# Patient Record
Sex: Female | Born: 1985
Health system: Southern US, Community
[De-identification: ages and names within clinical notes are randomized; demographics above are authoritative.]

## PROBLEM LIST (undated history)

## (undated) ENCOUNTER — Inpatient Hospital Stay (HOSPITAL_COMMUNITY): Payer: Self-pay

## (undated) DIAGNOSIS — I10 Essential (primary) hypertension: Secondary | ICD-10-CM

## (undated) DIAGNOSIS — T7840XA Allergy, unspecified, initial encounter: Secondary | ICD-10-CM

## (undated) DIAGNOSIS — IMO0002 Reserved for concepts with insufficient information to code with codable children: Secondary | ICD-10-CM

## (undated) DIAGNOSIS — F419 Anxiety disorder, unspecified: Secondary | ICD-10-CM

## (undated) DIAGNOSIS — F32A Depression, unspecified: Secondary | ICD-10-CM

## (undated) DIAGNOSIS — R87619 Unspecified abnormal cytological findings in specimens from cervix uteri: Secondary | ICD-10-CM

## (undated) HISTORY — PX: LEEP: SHX91

## (undated) HISTORY — DX: Anxiety disorder, unspecified: F41.9

## (undated) HISTORY — DX: Allergy, unspecified, initial encounter: T78.40XA

## (undated) HISTORY — DX: Depression, unspecified: F32.A

## (undated) HISTORY — PX: ABDOMINAL HYSTERECTOMY: SHX81

## (undated) HISTORY — PX: WISDOM TOOTH EXTRACTION: SHX21

## (undated) HISTORY — DX: Unspecified abnormal cytological findings in specimens from cervix uteri: R87.619

---

## 2010-10-28 ENCOUNTER — Emergency Department (HOSPITAL_COMMUNITY)
Admission: EM | Admit: 2010-10-28 | Discharge: 2010-10-28 | Payer: Self-pay | Source: Home / Self Care | Admitting: Family Medicine

## 2011-05-20 ENCOUNTER — Inpatient Hospital Stay (INDEPENDENT_AMBULATORY_CARE_PROVIDER_SITE_OTHER)
Admission: RE | Admit: 2011-05-20 | Discharge: 2011-05-20 | Disposition: A | Discharge status: Home / Self Care | Payer: BC Managed Care – PPO | Source: Ambulatory Visit | Attending: Emergency Medicine | Admitting: Emergency Medicine

## 2011-05-20 ENCOUNTER — Ambulatory Visit (INDEPENDENT_AMBULATORY_CARE_PROVIDER_SITE_OTHER): Payer: BC Managed Care – PPO

## 2011-05-20 DIAGNOSIS — J40 Bronchitis, not specified as acute or chronic: Secondary | ICD-10-CM

## 2011-05-20 LAB — POCT PREGNANCY, URINE: Preg Test, Ur: NEGATIVE

## 2012-06-30 LAB — OB RESULTS CONSOLE RPR: RPR: NONREACTIVE

## 2012-06-30 LAB — OB RESULTS CONSOLE GC/CHLAMYDIA
Chlamydia: NEGATIVE
Gonorrhea: NEGATIVE

## 2012-06-30 LAB — OB RESULTS CONSOLE ABO/RH: RH Type: POSITIVE

## 2012-06-30 LAB — OB RESULTS CONSOLE RUBELLA ANTIBODY, IGM: Rubella: IMMUNE

## 2012-06-30 LAB — OB RESULTS CONSOLE HEPATITIS B SURFACE ANTIGEN: Hepatitis B Surface Ag: NEGATIVE

## 2012-06-30 LAB — OB RESULTS CONSOLE HIV ANTIBODY (ROUTINE TESTING): HIV: NONREACTIVE

## 2012-06-30 LAB — OB RESULTS CONSOLE ANTIBODY SCREEN: Antibody Screen: NEGATIVE

## 2012-09-29 ENCOUNTER — Observation Stay: Payer: Self-pay | Admitting: Obstetrics and Gynecology

## 2012-09-29 LAB — URINALYSIS, COMPLETE
Bacteria: NONE SEEN
Bilirubin,UR: NEGATIVE
Blood: NEGATIVE
Glucose,UR: NEGATIVE mg/dL (ref 0–75)
Ketone: NEGATIVE
Nitrite: NEGATIVE
Ph: 8 (ref 4.5–8.0)
Protein: NEGATIVE
RBC,UR: NONE SEEN /HPF (ref 0–5)
Specific Gravity: 1.004 (ref 1.003–1.030)
Squamous Epithelial: 1
WBC UR: 1 /HPF (ref 0–5)

## 2012-09-30 LAB — URINE CULTURE

## 2012-10-12 NOTE — L&D Delivery Note (Signed)
Pt completed the first stage without difficulty. She pushed for 2 hours and had a SVD of one live viable white female infant over a second degree midline tear in the ROA position. Placenta S/I. EBL-400cc. Baby to NBN. Tear closed with 3-0 Chromic.

## 2012-12-14 LAB — OB RESULTS CONSOLE GBS: GBS: NEGATIVE

## 2012-12-15 ENCOUNTER — Encounter (HOSPITAL_COMMUNITY): Payer: Self-pay

## 2012-12-15 ENCOUNTER — Inpatient Hospital Stay (HOSPITAL_COMMUNITY)
Admission: AD | Admit: 2012-12-15 | Discharge: 2012-12-15 | Disposition: A | Discharge status: Home / Self Care | Payer: BC Managed Care – PPO | Source: Ambulatory Visit | Attending: Obstetrics and Gynecology | Admitting: Obstetrics and Gynecology

## 2012-12-15 DIAGNOSIS — O4690 Antepartum hemorrhage, unspecified, unspecified trimester: Secondary | ICD-10-CM | POA: Insufficient documentation

## 2012-12-15 DIAGNOSIS — O133 Gestational [pregnancy-induced] hypertension without significant proteinuria, third trimester: Secondary | ICD-10-CM

## 2012-12-15 DIAGNOSIS — O4693 Antepartum hemorrhage, unspecified, third trimester: Secondary | ICD-10-CM

## 2012-12-15 DIAGNOSIS — R030 Elevated blood-pressure reading, without diagnosis of hypertension: Secondary | ICD-10-CM | POA: Insufficient documentation

## 2012-12-15 DIAGNOSIS — R51 Headache: Secondary | ICD-10-CM | POA: Insufficient documentation

## 2012-12-15 DIAGNOSIS — O99891 Other specified diseases and conditions complicating pregnancy: Secondary | ICD-10-CM | POA: Insufficient documentation

## 2012-12-15 DIAGNOSIS — O139 Gestational [pregnancy-induced] hypertension without significant proteinuria, unspecified trimester: Secondary | ICD-10-CM

## 2012-12-15 HISTORY — DX: Unspecified abnormal cytological findings in specimens from cervix uteri: R87.619

## 2012-12-15 HISTORY — DX: Reserved for concepts with insufficient information to code with codable children: IMO0002

## 2012-12-15 LAB — COMPREHENSIVE METABOLIC PANEL
ALT: 7 U/L (ref 0–35)
AST: 13 U/L (ref 0–37)
Albumin: 2.6 g/dL — ABNORMAL LOW (ref 3.5–5.2)
Alkaline Phosphatase: 84 U/L (ref 39–117)
BUN: 6 mg/dL (ref 6–23)
CO2: 18 mEq/L — ABNORMAL LOW (ref 19–32)
Calcium: 9.1 mg/dL (ref 8.4–10.5)
Chloride: 104 mEq/L (ref 96–112)
Creatinine, Ser: 0.66 mg/dL (ref 0.50–1.10)
GFR calc Af Amer: >90 mL/min (ref >90)
GFR calc non Af Amer: >90 mL/min (ref >90)
Glucose, Bld: 140 mg/dL — ABNORMAL HIGH (ref 70–99)
Potassium: 3.6 mEq/L (ref 3.5–5.1)
Sodium: 135 mEq/L (ref 135–145)
Total Bilirubin: 0.2 mg/dL — ABNORMAL LOW (ref 0.3–1.2)
Total Protein: 6.2 g/dL (ref 6.0–8.3)

## 2012-12-15 LAB — CBC
HCT: 34.1 % — ABNORMAL LOW (ref 36.0–46.0)
Hemoglobin: 11.4 g/dL — ABNORMAL LOW (ref 12.0–15.0)
MCH: 29.7 pg (ref 26.0–34.0)
MCHC: 33.4 g/dL (ref 30.0–36.0)
MCV: 88.8 fL (ref 78.0–100.0)
Platelets: 248 10*3/uL (ref 150–400)
RBC: 3.84 MIL/uL — ABNORMAL LOW (ref 3.87–5.11)
RDW: 13.3 % (ref 11.5–15.5)
WBC: 9.8 10*3/uL (ref 4.0–10.5)

## 2012-12-15 LAB — URINALYSIS, ROUTINE W REFLEX MICROSCOPIC
Bilirubin Urine: NEGATIVE
Glucose, UA: 100 mg/dL — AB
Ketones, ur: NEGATIVE mg/dL
Leukocytes, UA: NEGATIVE
Nitrite: NEGATIVE
Protein, ur: NEGATIVE mg/dL
Specific Gravity, Urine: 1.015 (ref 1.005–1.030)
Urobilinogen, UA: 0.2 mg/dL (ref 0.0–1.0)
pH: 7 (ref 5.0–8.0)

## 2012-12-15 LAB — URINE MICROSCOPIC-ADD ON

## 2012-12-15 LAB — LACTATE DEHYDROGENASE: LDH: 176 U/L (ref 94–250)

## 2012-12-15 LAB — URIC ACID: Uric Acid, Serum: 4.8 mg/dL (ref 2.4–7.0)

## 2012-12-15 MED ORDER — BUTALBITAL-APAP-CAFFEINE 50-325-40 MG PO TABS: 1.0000 | ORAL_TABLET | Freq: Four times a day (QID) | ORAL | Status: AC | PRN

## 2012-12-15 MED ORDER — BUTALBITAL-APAP-CAFFEINE 50-325-40 MG PO TABS: 1.0000 | ORAL_TABLET | Freq: Once | ORAL | Status: DC

## 2012-12-15 NOTE — MAU Note (Signed)
Patient states she had her first vaginal exam yesterday and was 1 cm. Has been having bleeding each time she wipes and has passed small clots. Patient is not wearing a pad. Reports no contractions and has fetal movement. Has been having a headache.

## 2012-12-15 NOTE — MAU Provider Note (Signed)
  History     CSN: [id number]  Arrival date and time: 12/15/12 0859   None     Chief Complaint  Patient presents with  . Vaginal Bleeding  . Headache   HPI This is a 27 y.o. female at [redacted]w[redacted]d who presents with c/o headache and vaginal bleeding.  States got checked yesterday and was told she would have spotting, but bleeding was heavier and had clots. Tried Tylenol for headache which did not help.   RN Note:  Patient states she had her first vaginal exam yesterday and was 1 cm. Has been having bleeding each time she wipes and has passed small clots. Patient is not wearing a pad. Reports no contractions and has fetal movement. Has been having a headache.       OB History   Grav Para Term Preterm Abortions TAB SAB Ect Mult Living   1               Past Medical History  Diagnosis Date  . Abnormal Pap smear     Past Surgical History  Procedure Laterality Date  . Leep    . Wisdom tooth extraction      History reviewed. No pertinent family history.  History  Substance Use Topics  . Smoking status: Never Smoker   . Smokeless tobacco: Never Used  . Alcohol Use: No    Allergies:  Allergies  Allergen Reactions  . Penicillins Other (See Comments)    Unknown, childhood reaction    No prescriptions prior to admission    Review of Systems  Constitutional: Negative for fever, chills and malaise/fatigue.  Gastrointestinal: Negative for nausea, vomiting, abdominal pain, diarrhea and constipation.  Genitourinary: Negative for dysuria.  Neurological: Positive for headaches. Negative for dizziness and focal weakness.   Physical Exam   Blood pressure 134/83, pulse 95, temperature 98 F (36.7 C), temperature source Oral, resp. rate 16, height 5' 4.5" (1.638 m), weight 230 lb 12.8 oz (104.69 kg), SpO2 100.00%. Filed Vitals:   12/15/12 1029 12/15/12 1031 12/15/12 1034 12/15/12 1215  BP:  125/80  122/79  Pulse: 81 84 76 78  Temp:      TempSrc:      Resp:    18  Height:       Weight:      SpO2: 98%  98%     Physical Exam  Constitutional: She is oriented to person, place, and time. She appears well-developed and well-nourished. No distress.  HENT:  Head: Normocephalic.  Cardiovascular: Normal rate.   Respiratory: Effort normal.  GI: Soft. There is no tenderness. There is no rebound and no guarding.  Genitourinary: Uterus normal. Vaginal discharge (scant tan discharge, no red blood or clots seen) found.  Musculoskeletal: Normal range of motion.  Neurological: She is alert and oriented to person, place, and time.  Skin: Skin is warm and dry.  Psychiatric: She has a normal mood and affect.   FHR reactive Irregular UCs MAU Course  Procedures  MDM Fioricet given for headache with relief  Assessment and Plan  A:  SIUP at [redacted]w[redacted]d      Reported bleeding, probably due to exam       Borderline elevation in BP      Headache  P:  Discharge home      Followup on Monday in office for BP check      Strict PIH precautions  WILLIAMS,MARIE 12/15/2012, 9:56 AM

## 2012-12-15 NOTE — MAU Note (Signed)
Had cervical exam yesterday and has had small clots noted since then. Was 1cm dialated. Has had c/o constant cramping x1 month. Notes headache this am, took tylenol with no relief.

## 2013-01-07 ENCOUNTER — Encounter (HOSPITAL_COMMUNITY): Payer: Self-pay | Admitting: Anesthesiology

## 2013-01-07 ENCOUNTER — Inpatient Hospital Stay (HOSPITAL_COMMUNITY): Payer: BC Managed Care – PPO

## 2013-01-07 ENCOUNTER — Inpatient Hospital Stay (HOSPITAL_COMMUNITY): Payer: BC Managed Care – PPO | Admitting: Anesthesiology

## 2013-01-07 ENCOUNTER — Encounter (HOSPITAL_COMMUNITY): Payer: Self-pay

## 2013-01-07 ENCOUNTER — Inpatient Hospital Stay (HOSPITAL_COMMUNITY)
Admission: AD | Admit: 2013-01-07 | Discharge: 2013-01-09 | DRG: 373 | Disposition: A | Discharge status: Home / Self Care | Payer: BC Managed Care – PPO | Source: Ambulatory Visit | Attending: Obstetrics and Gynecology | Admitting: Obstetrics and Gynecology

## 2013-01-07 LAB — CBC
HCT: 35.3 % — ABNORMAL LOW (ref 36.0–46.0)
Hemoglobin: 11.8 g/dL — ABNORMAL LOW (ref 12.0–15.0)
MCH: 29.4 pg (ref 26.0–34.0)
MCHC: 33.4 g/dL (ref 30.0–36.0)
MCV: 87.8 fL (ref 78.0–100.0)
Platelets: 267 10*3/uL (ref 150–400)
RBC: 4.02 MIL/uL (ref 3.87–5.11)
RDW: 13.9 % (ref 11.5–15.5)
WBC: 13.9 10*3/uL — ABNORMAL HIGH (ref 4.0–10.5)

## 2013-01-07 LAB — AMNISURE RUPTURE OF MEMBRANE (ROM) NOT AT ARMC: Amnisure ROM: POSITIVE

## 2013-01-07 MED ORDER — LIDOCAINE HCL (PF) 1 % IJ SOLN
30.0000 mL | INTRAMUSCULAR | Status: DC | PRN
  Administered 2013-01-08: 30 mL via SUBCUTANEOUS
  Filled 2013-01-07 (×2): qty 30

## 2013-01-07 MED ORDER — LACTATED RINGERS IV SOLN
INTRAVENOUS | Status: DC
  Administered 2013-01-07 – 2013-01-08 (×2): via INTRAVENOUS

## 2013-01-07 MED ORDER — FLEET ENEMA 7-19 GM/118ML RE ENEM: 1.0000 | ENEMA | RECTAL | Status: DC | PRN

## 2013-01-07 MED ORDER — ACETAMINOPHEN 325 MG PO TABS: 650.0000 mg | ORAL_TABLET | ORAL | Status: DC | PRN

## 2013-01-07 MED ORDER — PHENYLEPHRINE 40 MCG/ML (10ML) SYRINGE FOR IV PUSH (FOR BLOOD PRESSURE SUPPORT)
80.0000 ug | PREFILLED_SYRINGE | INTRAVENOUS | Status: DC | PRN
  Filled 2013-01-07: qty 2
  Filled 2013-01-07: qty 5

## 2013-01-07 MED ORDER — SODIUM BICARBONATE 8.4 % IV SOLN
INTRAVENOUS | Status: DC | PRN
  Administered 2013-01-07: 5 mL via EPIDURAL

## 2013-01-07 MED ORDER — BUTORPHANOL TARTRATE 1 MG/ML IJ SOLN
1.0000 mg | INTRAMUSCULAR | Status: DC | PRN
  Administered 2013-01-07: 1 mg via INTRAVENOUS
  Filled 2013-01-07: qty 1

## 2013-01-07 MED ORDER — EPHEDRINE 5 MG/ML INJ
10.0000 mg | INTRAVENOUS | Status: DC | PRN
  Filled 2013-01-07: qty 2

## 2013-01-07 MED ORDER — FENTANYL 2.5 MCG/ML BUPIVACAINE 1/10 % EPIDURAL INFUSION (WH - ANES)
14.0000 mL/h | INTRAMUSCULAR | Status: DC | PRN
  Administered 2013-01-07: 14 mL/h via EPIDURAL
  Filled 2013-01-07: qty 125

## 2013-01-07 MED ORDER — OXYCODONE-ACETAMINOPHEN 5-325 MG PO TABS: 1.0000 | ORAL_TABLET | ORAL | Status: DC | PRN

## 2013-01-07 MED ORDER — LACTATED RINGERS IV SOLN: 500.0000 mL | INTRAVENOUS | Status: DC | PRN

## 2013-01-07 MED ORDER — OXYTOCIN BOLUS FROM INFUSION
500.0000 mL | INTRAVENOUS | Status: DC
  Administered 2013-01-08: 500 mL via INTRAVENOUS

## 2013-01-07 MED ORDER — DIPHENHYDRAMINE HCL 50 MG/ML IJ SOLN: 12.5000 mg | INTRAMUSCULAR | Status: DC | PRN

## 2013-01-07 MED ORDER — ONDANSETRON HCL 4 MG/2ML IJ SOLN: 4.0000 mg | Freq: Four times a day (QID) | INTRAMUSCULAR | Status: DC | PRN

## 2013-01-07 MED ORDER — EPHEDRINE 5 MG/ML INJ
10.0000 mg | INTRAVENOUS | Status: DC | PRN
  Filled 2013-01-07: qty 4
  Filled 2013-01-07: qty 2

## 2013-01-07 MED ORDER — PHENYLEPHRINE 40 MCG/ML (10ML) SYRINGE FOR IV PUSH (FOR BLOOD PRESSURE SUPPORT)
80.0000 ug | PREFILLED_SYRINGE | INTRAVENOUS | Status: DC | PRN
  Filled 2013-01-07: qty 2

## 2013-01-07 MED ORDER — CITRIC ACID-SODIUM CITRATE 334-500 MG/5ML PO SOLN: 30.0000 mL | ORAL | Status: DC | PRN

## 2013-01-07 MED ORDER — IBUPROFEN 600 MG PO TABS
600.0000 mg | ORAL_TABLET | Freq: Four times a day (QID) | ORAL | Status: DC | PRN
  Administered 2013-01-08: 600 mg via ORAL
  Filled 2013-01-07: qty 1

## 2013-01-07 MED ORDER — OXYTOCIN 40 UNITS IN LACTATED RINGERS INFUSION - SIMPLE MED
62.5000 mL/h | INTRAVENOUS | Status: DC
  Filled 2013-01-07: qty 1000

## 2013-01-07 MED ORDER — LACTATED RINGERS IV SOLN
500.0000 mL | Freq: Once | INTRAVENOUS | Status: AC
  Administered 2013-01-07: 500 mL via INTRAVENOUS

## 2013-01-07 NOTE — H&P (Signed)
Pt is a 27 year old white female, G1P0 at term who presents to the ER c/o SROM. The history was c/w SROM put on exam the pt had no pool,no fern. Her Amniostat was positive. Cx is 80/3/-2 vtx. PNC was uncomplicated. PMHX: see holister PE: HEENT-wnl        ABD-gravid, non tender, no masses        Exts- wnl        FHTs- reactive IMP/ IUP with SROM PLAN/ admit

## 2013-01-07 NOTE — Anesthesia Preprocedure Evaluation (Signed)

## 2013-01-07 NOTE — MAU Note (Signed)
Positive fern by sterile spec.

## 2013-01-07 NOTE — MAU Note (Signed)
Called Dr. Dareen Piano requesting admit orders was told no coming to see the patient.

## 2013-01-07 NOTE — MAU Note (Signed)
Pt reports she has a gush of fluid that is contunies to leak. Reprots some cramping off and on nothhing regular. Reports good fetal movment.

## 2013-01-07 NOTE — Anesthesia Procedure Notes (Signed)

## 2013-01-08 ENCOUNTER — Encounter (HOSPITAL_COMMUNITY): Payer: Self-pay | Admitting: Family Medicine

## 2013-01-08 LAB — TYPE AND SCREEN
ABO/RH(D): A POS
Antibody Screen: NEGATIVE

## 2013-01-08 LAB — RPR: RPR Ser Ql: NONREACTIVE

## 2013-01-08 LAB — ABO/RH: ABO/RH(D): A POS

## 2013-01-08 MED ORDER — TETANUS-DIPHTH-ACELL PERTUSSIS 5-2.5-18.5 LF-MCG/0.5 IM SUSP
0.5000 mL | Freq: Once | INTRAMUSCULAR | Status: AC
  Administered 2013-01-09: 0.5 mL via INTRAMUSCULAR

## 2013-01-08 MED ORDER — ZOLPIDEM TARTRATE 5 MG PO TABS: 5.0000 mg | ORAL_TABLET | Freq: Every evening | ORAL | Status: DC | PRN

## 2013-01-08 MED ORDER — ONDANSETRON HCL 4 MG/2ML IJ SOLN: 4.0000 mg | INTRAMUSCULAR | Status: DC | PRN

## 2013-01-08 MED ORDER — WITCH HAZEL-GLYCERIN EX PADS: 1.0000 "application " | MEDICATED_PAD | CUTANEOUS | Status: DC | PRN

## 2013-01-08 MED ORDER — SENNOSIDES-DOCUSATE SODIUM 8.6-50 MG PO TABS
2.0000 | ORAL_TABLET | Freq: Every day | ORAL | Status: DC
  Administered 2013-01-08: 2 via ORAL

## 2013-01-08 MED ORDER — PRENATAL MULTIVITAMIN CH
1.0000 | ORAL_TABLET | Freq: Every day | ORAL | Status: DC
  Administered 2013-01-08: 1 via ORAL
  Filled 2013-01-08 (×3): qty 1

## 2013-01-08 MED ORDER — BENZOCAINE-MENTHOL 20-0.5 % EX AERO
1.0000 "application " | INHALATION_SPRAY | CUTANEOUS | Status: DC | PRN
  Administered 2013-01-08: 1 via TOPICAL
  Filled 2013-01-08: qty 56

## 2013-01-08 MED ORDER — DIBUCAINE 1 % RE OINT: 1.0000 "application " | TOPICAL_OINTMENT | RECTAL | Status: DC | PRN

## 2013-01-08 MED ORDER — OXYCODONE-ACETAMINOPHEN 5-325 MG PO TABS: 1.0000 | ORAL_TABLET | ORAL | Status: DC | PRN

## 2013-01-08 MED ORDER — SIMETHICONE 80 MG PO CHEW: 80.0000 mg | CHEWABLE_TABLET | ORAL | Status: DC | PRN

## 2013-01-08 MED ORDER — IBUPROFEN 600 MG PO TABS
600.0000 mg | ORAL_TABLET | Freq: Four times a day (QID) | ORAL | Status: DC
  Administered 2013-01-08 – 2013-01-09 (×4): 600 mg via ORAL
  Filled 2013-01-08 (×5): qty 1

## 2013-01-08 MED ORDER — MEASLES, MUMPS & RUBELLA VAC ~~LOC~~ INJ
0.5000 mL | INJECTION | Freq: Once | SUBCUTANEOUS | Status: DC
  Filled 2013-01-08: qty 0.5

## 2013-01-08 MED ORDER — ONDANSETRON HCL 4 MG PO TABS: 4.0000 mg | ORAL_TABLET | ORAL | Status: DC | PRN

## 2013-01-08 NOTE — Progress Notes (Signed)
PPD#0 Pt without c/o. Lochia-mild. Cannot do a circ. Baby has not voided. VSSAF IMP/ stable PLAN/ routine care,.

## 2013-01-08 NOTE — Anesthesia Postprocedure Evaluation (Signed)
Anesthesia Post Note  Patient: Stacey Huynh  Procedure(s) Performed: * No procedures listed *  Anesthesia type: Epidural  Patient location: Mother/Baby  Post pain: Pain level controlled  Post assessment: Post-op Vital signs reviewed  Last Vitals: BP 123/73  Pulse 94  Temp(Src) 36.8 C (Oral)  Resp 20  Ht 5\' 6"  (1.676 m)  Wt 237 lb 9.6 oz (107.775 kg)  BMI 38.37 kg/m2  SpO2 100%  Post vital signs: Reviewed  Level of consciousness: awake  Complications: No apparent anesthesia complications

## 2013-01-09 NOTE — Discharge Summary (Signed)
Obstetric Discharge Summary Reason for Admission: rupture of membranes(positive amniosure) Prenatal Procedures: ultrasound Intrapartum Procedures: spontaneous vaginal delivery Postpartum Procedures: none Complications-Operative and Postpartum: 2nd degree perineal laceration Hemoglobin  Date Value Range Status  01/08/2013 9.8* 12.0 - 15.0 g/dL Final     REPEATED TO VERIFY     DELTA CHECK NOTED     HCT  Date Value Range Status  01/08/2013 29.1* 36.0 - 46.0 % Final    Physical Exam:  General: alert Lochia: appropriate Uterine Fundus: firm  Discharge Diagnoses: Term Pregnancy-delivered  Discharge Information: Date: 01/09/2013 Activity: pelvic rest Diet: routine Medications: PNV and Ibuprofen Condition: stable Instructions: refer to practice specific booklet Discharge to: home Follow-up Information   Follow up with Levi Aland, MD. Schedule an appointment as soon as possible for a visit in 4 weeks.   Contact information:   [address] [place] [address] [phone]       Newborn Data: Live born female  Birth Weight: 7 lb 12.5 oz (3530 g) APGAR: 8, 9  Home with mother.  Keila Turan D 01/09/2013, 9:43 AM

## 2013-01-11 LAB — CBC
HCT: 29.1 % — ABNORMAL LOW (ref 36.0–46.0)
Hemoglobin: 9.8 g/dL — ABNORMAL LOW (ref 12.0–15.0)
MCH: 29.7 pg (ref 26.0–34.0)
MCHC: 33.7 g/dL (ref 30.0–36.0)
MCV: 88.2 fL (ref 78.0–100.0)
Platelets: 200 10*3/uL (ref 150–400)
RBC: 3.3 MIL/uL — ABNORMAL LOW (ref 3.87–5.11)
RDW: 13.9 % (ref 11.5–15.5)
WBC: 18.2 10*3/uL — ABNORMAL HIGH (ref 4.0–10.5)

## 2014-08-13 ENCOUNTER — Encounter (HOSPITAL_COMMUNITY): Payer: Self-pay | Admitting: Family Medicine

## 2016-05-11 DIAGNOSIS — Z01419 Encounter for gynecological examination (general) (routine) without abnormal findings: Secondary | ICD-10-CM | POA: Diagnosis not present

## 2016-06-02 DIAGNOSIS — S80862A Insect bite (nonvenomous), left lower leg, initial encounter: Secondary | ICD-10-CM | POA: Diagnosis not present

## 2016-06-02 DIAGNOSIS — A6920 Lyme disease, unspecified: Secondary | ICD-10-CM | POA: Diagnosis not present

## 2016-06-02 DIAGNOSIS — W57XXXA Bitten or stung by nonvenomous insect and other nonvenomous arthropods, initial encounter: Secondary | ICD-10-CM | POA: Diagnosis not present

## 2016-06-09 DIAGNOSIS — E669 Obesity, unspecified: Secondary | ICD-10-CM | POA: Diagnosis not present

## 2016-06-09 DIAGNOSIS — E559 Vitamin D deficiency, unspecified: Secondary | ICD-10-CM | POA: Diagnosis not present

## 2016-06-09 DIAGNOSIS — R5383 Other fatigue: Secondary | ICD-10-CM | POA: Diagnosis not present

## 2016-06-09 DIAGNOSIS — A6920 Lyme disease, unspecified: Secondary | ICD-10-CM | POA: Diagnosis not present

## 2016-06-09 DIAGNOSIS — E7800 Pure hypercholesterolemia, unspecified: Secondary | ICD-10-CM | POA: Diagnosis not present

## 2016-07-10 DIAGNOSIS — E669 Obesity, unspecified: Secondary | ICD-10-CM | POA: Diagnosis not present

## 2016-07-10 DIAGNOSIS — A6920 Lyme disease, unspecified: Secondary | ICD-10-CM | POA: Diagnosis not present

## 2016-07-10 DIAGNOSIS — Z23 Encounter for immunization: Secondary | ICD-10-CM | POA: Diagnosis not present

## 2016-08-24 DIAGNOSIS — E669 Obesity, unspecified: Secondary | ICD-10-CM | POA: Diagnosis not present

## 2016-08-24 DIAGNOSIS — F419 Anxiety disorder, unspecified: Secondary | ICD-10-CM | POA: Diagnosis not present

## 2016-08-24 DIAGNOSIS — Z8659 Personal history of other mental and behavioral disorders: Secondary | ICD-10-CM | POA: Diagnosis not present

## 2016-09-24 DIAGNOSIS — F419 Anxiety disorder, unspecified: Secondary | ICD-10-CM | POA: Diagnosis not present

## 2016-09-24 DIAGNOSIS — E559 Vitamin D deficiency, unspecified: Secondary | ICD-10-CM | POA: Diagnosis not present

## 2016-09-24 DIAGNOSIS — A6920 Lyme disease, unspecified: Secondary | ICD-10-CM | POA: Diagnosis not present

## 2016-09-24 DIAGNOSIS — Z8659 Personal history of other mental and behavioral disorders: Secondary | ICD-10-CM | POA: Diagnosis not present

## 2016-10-20 DIAGNOSIS — J209 Acute bronchitis, unspecified: Secondary | ICD-10-CM | POA: Diagnosis not present

## 2016-10-20 DIAGNOSIS — F419 Anxiety disorder, unspecified: Secondary | ICD-10-CM | POA: Diagnosis not present

## 2016-10-20 DIAGNOSIS — E669 Obesity, unspecified: Secondary | ICD-10-CM | POA: Diagnosis not present

## 2016-10-20 DIAGNOSIS — J45909 Unspecified asthma, uncomplicated: Secondary | ICD-10-CM | POA: Diagnosis not present

## 2016-11-30 DIAGNOSIS — A6920 Lyme disease, unspecified: Secondary | ICD-10-CM | POA: Diagnosis not present

## 2016-11-30 DIAGNOSIS — K219 Gastro-esophageal reflux disease without esophagitis: Secondary | ICD-10-CM | POA: Diagnosis not present

## 2016-11-30 DIAGNOSIS — E7800 Pure hypercholesterolemia, unspecified: Secondary | ICD-10-CM | POA: Diagnosis not present

## 2016-11-30 DIAGNOSIS — R5383 Other fatigue: Secondary | ICD-10-CM | POA: Diagnosis not present

## 2016-11-30 DIAGNOSIS — E669 Obesity, unspecified: Secondary | ICD-10-CM | POA: Diagnosis not present

## 2016-11-30 DIAGNOSIS — F419 Anxiety disorder, unspecified: Secondary | ICD-10-CM | POA: Diagnosis not present

## 2016-11-30 DIAGNOSIS — E049 Nontoxic goiter, unspecified: Secondary | ICD-10-CM | POA: Diagnosis not present

## 2016-11-30 DIAGNOSIS — E559 Vitamin D deficiency, unspecified: Secondary | ICD-10-CM | POA: Diagnosis not present

## 2016-11-30 DIAGNOSIS — R1013 Epigastric pain: Secondary | ICD-10-CM | POA: Diagnosis not present

## 2016-11-30 DIAGNOSIS — J45909 Unspecified asthma, uncomplicated: Secondary | ICD-10-CM | POA: Diagnosis not present

## 2017-04-18 DIAGNOSIS — M545 Low back pain: Secondary | ICD-10-CM | POA: Diagnosis not present

## 2017-04-22 ENCOUNTER — Encounter: Payer: Self-pay | Admitting: *Deleted

## 2017-04-22 ENCOUNTER — Emergency Department
Admission: EM | Admit: 2017-04-22 | Discharge: 2017-04-22 | Disposition: A | Discharge status: Home / Self Care | Payer: BLUE CROSS/BLUE SHIELD | Attending: Emergency Medicine | Admitting: Emergency Medicine

## 2017-04-22 DIAGNOSIS — M62830 Muscle spasm of back: Secondary | ICD-10-CM

## 2017-04-22 DIAGNOSIS — Z79899 Other long term (current) drug therapy: Secondary | ICD-10-CM | POA: Diagnosis not present

## 2017-04-22 DIAGNOSIS — S39012A Strain of muscle, fascia and tendon of lower back, initial encounter: Secondary | ICD-10-CM | POA: Diagnosis not present

## 2017-04-22 DIAGNOSIS — M545 Low back pain, unspecified: Secondary | ICD-10-CM

## 2017-04-22 LAB — POCT PREGNANCY, URINE: Preg Test, Ur: NEGATIVE

## 2017-04-22 LAB — URINALYSIS, COMPLETE (UACMP) WITH MICROSCOPIC
Bacteria, UA: NONE SEEN
Bilirubin Urine: NEGATIVE
Glucose, UA: NEGATIVE mg/dL
Hgb urine dipstick: NEGATIVE
Ketones, ur: NEGATIVE mg/dL
Leukocytes, UA: NEGATIVE
Nitrite: NEGATIVE
Protein, ur: NEGATIVE mg/dL
Specific Gravity, Urine: 1.019 (ref 1.005–1.030)
WBC, UA: NONE SEEN WBC/hpf (ref 0–5)
pH: 6 (ref 5.0–8.0)

## 2017-04-22 MED ORDER — KETOROLAC TROMETHAMINE 30 MG/ML IJ SOLN
30.0000 mg | Freq: Once | INTRAMUSCULAR | Status: AC
  Administered 2017-04-22: 30 mg via INTRAMUSCULAR
  Filled 2017-04-22: qty 1

## 2017-04-22 MED ORDER — PREDNISONE 10 MG PO TABS: 10.0000 mg | ORAL_TABLET | Freq: Every day | ORAL | 0 refills | Status: DC

## 2017-04-22 MED ORDER — ORPHENADRINE CITRATE 30 MG/ML IJ SOLN
60.0000 mg | Freq: Two times a day (BID) | INTRAMUSCULAR | Status: DC
  Administered 2017-04-22: 60 mg via INTRAMUSCULAR
  Filled 2017-04-22: qty 2

## 2017-04-22 MED ORDER — CYCLOBENZAPRINE HCL 5 MG PO TABS: 5.0000 mg | ORAL_TABLET | Freq: Three times a day (TID) | ORAL | 0 refills | Status: DC | PRN

## 2017-04-22 NOTE — ED Notes (Signed)
See triage note  States she developed lower back pain about 1 week ago denies any injury or urinary sxs'  States pain radiates across lower back and into groin area  Ambulates slowly d/t increased

## 2017-04-22 NOTE — ED Notes (Signed)
Patient verbalizes understanding of d/c instructions and follow-up. VS stable and pain controlled per patient.  Patient in NAD at time of d/c and denies further concerns regarding this visit. Patient stable at the time of departure from the unit, departing unit by the safest and most appropriate manner per that patients condition and limitations. Patient advised to return to the ED at any time for emergent concerns, or for new/worsening symptoms.   

## 2017-04-22 NOTE — Discharge Instructions (Signed)
Please take prednisone and Flexeril as prescribed. After completing prednisone you can restart meloxicam. Return to the ER immediately for any fevers, increasing pain, vaginal bleeding or discharge.

## 2017-04-22 NOTE — ED Provider Notes (Signed)
[facility]-EMERGENCY DEPARTMENT Provider Note   CSN: [id number] Arrival date & time: 04/22/17  1734     History   Chief Complaint Chief Complaint  Patient presents with  . Back Pain    HPI Stacey Huynh is a 31 y.o. female presents to the emergency room for evaluation of back pain 7 days. Patient describes a constant sharp stabbing pain in the middle of her lower back that radiates to the left and to the right. Pain is present along the SI joints bilaterally. Pain is increased with movement such as standing, bending, standing from a seated position. She gets some relief with lying down. She denies any numbness tingling or radicular symptoms. For the last 4 days she has been taking meloxicam 7.5 mg daily and Flexeril 5 mg at night. As any urinary symptoms. No fevers, headache, rashes. Patient has a history of IUD placement 2 years ago, denies any complications. She denies any pelvic pain, vaginal bleeding, discharge.   HPI  Past Medical History:  Diagnosis Date  . Abnormal Pap smear     There are no active problems to display for this patient.   Past Surgical History:  Procedure Laterality Date  . LEEP    . WISDOM TOOTH EXTRACTION      OB History    Gravida Para Term Preterm AB Living   1 1 1     1    SAB TAB Ectopic Multiple Live Births           1       Home Medications    Prior to Admission medications   Medication Sig Start Date End Date Taking? Authorizing Provider  meloxicam (MOBIC) 7.5 MG tablet Take 7.5 mg by mouth daily.   Yes [provider]  acetaminophen (TYLENOL) 500 MG tablet Take 1,000 mg by mouth every 6 (six) hours as needed for pain.    [provider]  cyclobenzaprine (FLEXERIL) 5 MG tablet Take 1-2 tablets (5-10 mg total) by mouth 3 (three) times daily as needed for muscle spasms. 04/22/17   Evon Slack, PA-C  metroNIDAZOLE (FLAGYL) 500 MG tablet Take 500 mg by mouth 2 (two) times daily. 7 day supply, not yet completed 12/14/12    [provider]  predniSONE (DELTASONE) 10 MG tablet Take 1 tablet (10 mg total) by mouth daily. 6,5,4,3,2,1 six day taper 04/22/17   Evon Slack, PA-C  Prenatal Vit-Fe Fumarate-FA (PRENATAL MULTIVITAMIN) TABS Take 1 tablet by mouth at bedtime.    [provider]    Family History No family history on file.  Social History Social History  Substance Use Topics  . Smoking status: Never Smoker  . Smokeless tobacco: Never Used  . Alcohol use No     Allergies   Penicillins   Review of Systems Review of Systems  Constitutional: Negative for activity change, chills, fatigue and fever.  HENT: Negative for congestion, sinus pressure and sore throat.   Eyes: Negative for visual disturbance.  Respiratory: Negative for cough, chest tightness and shortness of breath.   Cardiovascular: Negative for chest pain and leg swelling.  Gastrointestinal: Negative for abdominal pain, diarrhea, nausea and vomiting.  Genitourinary: Negative for dysuria, pelvic pain, vaginal bleeding and vaginal discharge.  Musculoskeletal: Positive for back pain. Negative for arthralgias and gait problem.  Skin: Negative for rash and wound.  Neurological: Negative for dizziness, weakness, numbness and headaches.  Hematological: Negative for adenopathy.  Psychiatric/Behavioral: Negative for agitation, behavioral problems and confusion.  Physical Exam Updated Vital Signs BP (!) 158/89 (BP Location: Left Arm)   Pulse 79   Temp 98.4 F (36.9 C) (Oral)   Resp 18   Ht 5\' 6"  (1.676 m)   Wt 96.6 kg (213 lb)   LMP 03/30/2017 (Approximate)   SpO2 100%   BMI 34.38 kg/m   Physical Exam  Constitutional: She is oriented to person, place, and time. She appears well-developed and well-nourished.  HENT:  Head: Normocephalic and atraumatic.  Right Ear: External ear normal.  Left Ear: External ear normal.  Mouth/Throat: No oropharyngeal exudate.  Eyes: Conjunctivae are normal.  Neck: Normal  range of motion.  Cardiovascular: Normal rate and regular rhythm.   Pulmonary/Chest: Effort normal and breath sounds normal. No respiratory distress.  Abdominal: Soft. Bowel sounds are normal. She exhibits no mass. There is no guarding.  Musculoskeletal: Normal range of motion.  Lumbar Spine: Examination of the lumbar spine reveals no bony abnormality, no edema, and no ecchymosis.  There is no step off.  The patient has full range of motion of the lumbar spine with flexion and extension.  The patient has normal lateral bend and rotation.  The patient has pain with lumbar flexion, extension, lateral bend and rotation.  The patient has a negative axial load test, and a negative rotational Waddell test.  The patient is non tender along the spinous process.  The patient is tender along the left and right paravertebral muscles of the lumbar spine, with no muscle spasms.  The patient is non tender along the iliac crest.  The patient is non tender in the sciatic notch.  The patient is non tender along the Sacroiliac joint.  There is no Coccyx joint tenderness.    Bilateral Lower Extremities: Examination of the lower extremities reveals no bony abnormality, no edema, and no ecchymosis.  The patient has full active and passive range of motion of the hips, knees, and ankles.  There is no discomfort with range of motion exercises.  The patient is non tender along the greater trochanter region.  The patient has a negative Denna HaggardHomans' test bilaterally.  There is normal skin warmth.  There is normal capillary refill bilaterally.    Neurologic: The patient has a negative straight leg raise.  The patient has normal muscle strength testing for the quadriceps, calves, ankle dorsiflexion, ankle plantarflexion, and extensor hallicus longus.  The patient has sensation that is intact to light touch.  Patellar reflexes are normal bilaterally.   Neurological: She is alert and oriented to person, place, and time.  Skin: Skin is  warm. No erythema.  Psychiatric: She has a normal mood and affect. Her behavior is normal. Judgment and thought content normal.     ED Treatments / Results  Labs (all labs ordered are listed, but only abnormal results are displayed) Labs Reviewed  URINALYSIS, COMPLETE (UACMP) WITH MICROSCOPIC - Abnormal; Notable for the following:       Result Value   Color, Urine YELLOW (*)    APPearance CLEAR (*)    Squamous Epithelial / LPF 0-5 (*)    All other components within normal limits  POC URINE PREG, ED  POCT PREGNANCY, URINE    EKG  EKG Interpretation None       Radiology No results found.  Procedures Procedures (including critical care time)  Medications Ordered in ED Medications  orphenadrine (NORFLEX) injection 60 mg (60 mg Intramuscular Given 04/22/17 1849)  ketorolac (TORADOL) 30 MG/ML injection 30 mg (30 mg Intramuscular Given 04/22/17  1849)     Initial Impression / Assessment and Plan / ED Course  I have reviewed the triage vital signs and the nursing notes.  Pertinent labs & imaging results that were available during my care of the patient were reviewed by me and considered in my medical decision making (see chart for details).     31 year old female with lower back pain 1 week. She has muscular tenderness on exam and pain with neck range of motion. Vital signs are within normal limits. Pain improved with Toradol and Norflex. Was sent home with 6 day steroid taper and she will increase Flexeril from 5 mg to 10 mg 3 times a day when necessary. Follow-up with orthopedics if no improvement in 7-10 days. She is educated on signs and symptoms to return to the ED for.  Final Clinical Impressions(s) / ED Diagnoses   Final diagnoses:  Acute bilateral low back pain without sciatica  Strain of lumbar region, initial encounter  Muscle spasm of back    New Prescriptions New Prescriptions   CYCLOBENZAPRINE (FLEXERIL) 5 MG TABLET    Take 1-2 tablets (5-10 mg total) by  mouth 3 (three) times daily as needed for muscle spasms.   PREDNISONE (DELTASONE) 10 MG TABLET    Take 1 tablet (10 mg total) by mouth daily. 6,5,4,3,2,1 six day taper     Ronnette Juniper 04/22/17 1946    Evon Slack, PA-C 04/22/17 1946    Nita Sickle, MD 04/23/17 [phone]

## 2017-04-22 NOTE — ED Triage Notes (Signed)
Pt has low back pain.  No known injury.  No urinary sx.  [facility] and is taking meds without pain relief.   Pt alert.

## 2017-05-12 ENCOUNTER — Ambulatory Visit (INDEPENDENT_AMBULATORY_CARE_PROVIDER_SITE_OTHER): Payer: BLUE CROSS/BLUE SHIELD | Admitting: Obstetrics and Gynecology

## 2017-05-12 ENCOUNTER — Encounter: Payer: Self-pay | Admitting: Obstetrics and Gynecology

## 2017-05-12 ENCOUNTER — Other Ambulatory Visit: Payer: Self-pay | Admitting: Obstetrics and Gynecology

## 2017-05-12 VITALS — BP 106/70 | HR 92 | Ht 66.0 in | Wt 215.0 lb

## 2017-05-12 DIAGNOSIS — Z1329 Encounter for screening for other suspected endocrine disorder: Secondary | ICD-10-CM

## 2017-05-12 DIAGNOSIS — Z131 Encounter for screening for diabetes mellitus: Secondary | ICD-10-CM

## 2017-05-12 DIAGNOSIS — B354 Tinea corporis: Secondary | ICD-10-CM | POA: Diagnosis not present

## 2017-05-12 DIAGNOSIS — Z01419 Encounter for gynecological examination (general) (routine) without abnormal findings: Secondary | ICD-10-CM

## 2017-05-12 DIAGNOSIS — Z13220 Encounter for screening for lipoid disorders: Secondary | ICD-10-CM

## 2017-05-12 DIAGNOSIS — Z124 Encounter for screening for malignant neoplasm of cervix: Secondary | ICD-10-CM | POA: Diagnosis not present

## 2017-05-12 DIAGNOSIS — Z1231 Encounter for screening mammogram for malignant neoplasm of breast: Secondary | ICD-10-CM

## 2017-05-12 DIAGNOSIS — N939 Abnormal uterine and vaginal bleeding, unspecified: Secondary | ICD-10-CM

## 2017-05-12 DIAGNOSIS — N946 Dysmenorrhea, unspecified: Secondary | ICD-10-CM

## 2017-05-12 DIAGNOSIS — Z1239 Encounter for other screening for malignant neoplasm of breast: Secondary | ICD-10-CM

## 2017-05-12 MED ORDER — NYSTATIN 100000 UNIT/GM EX CREA: 1.0000 "application " | TOPICAL_CREAM | Freq: Two times a day (BID) | CUTANEOUS | 1 refills | Status: DC

## 2017-05-12 NOTE — Patient Instructions (Signed)
Preventive Care 18-39 Years, Female Preventive care refers to lifestyle choices and visits with your health care provider that can promote health and wellness. What does preventive care include?  A yearly physical exam. This is also called an annual well check.  Dental exams once or twice a year.  Routine eye exams. Ask your health care provider how often you should have your eyes checked.  Personal lifestyle choices, including: ? Daily care of your teeth and gums. ? Regular physical activity. ? Eating a healthy diet. ? Avoiding tobacco and drug use. ? Limiting alcohol use. ? Practicing safe sex. ? Taking vitamin and mineral supplements as recommended by your health care provider. What happens during an annual well check? The services and screenings done by your health care provider during your annual well check will depend on your age, overall health, lifestyle risk factors, and family history of disease. Counseling Your health care provider may ask you questions about your:  Alcohol use.  Tobacco use.  Drug use.  Emotional well-being.  Home and relationship well-being.  Sexual activity.  Eating habits.  Work and work Statistician.  Method of birth control.  Menstrual cycle.  Pregnancy history.  Screening You may have the following tests or measurements:  Height, weight, and BMI.  Diabetes screening. This is done by checking your blood sugar (glucose) after you have not eaten for a while (fasting).  Blood pressure.  Lipid and cholesterol levels. These may be checked every 5 years starting at age 66.  Skin check.  Hepatitis C blood test.  Hepatitis B blood test.  Sexually transmitted disease (STD) testing.  BRCA-related cancer screening. This may be done if you have a family history of breast, ovarian, tubal, or peritoneal cancers.  Pelvic exam and Pap test. This may be done every 3 years starting at age 40. Starting at age 59, this may be done every 5  years if you have a Pap test in combination with an HPV test.  Discuss your test results, treatment options, and if necessary, the need for more tests with your health care provider. Vaccines Your health care provider may recommend certain vaccines, such as:  Influenza vaccine. This is recommended every year.  Tetanus, diphtheria, and acellular pertussis (Tdap, Td) vaccine. You may need a Td booster every 10 years.  Varicella vaccine. You may need this if you have not been vaccinated.  HPV vaccine. If you are 69 or younger, you may need three doses over 6 months.  Measles, mumps, and rubella (MMR) vaccine. You may need at least one dose of MMR. You may also need a second dose.  Pneumococcal 13-valent conjugate (PCV13) vaccine. You may need this if you have certain conditions and were not previously vaccinated.  Pneumococcal polysaccharide (PPSV23) vaccine. You may need one or two doses if you smoke cigarettes or if you have certain conditions.  Meningococcal vaccine. One dose is recommended if you are age 27-21 years and a first-year college student living in a residence hall, or if you have one of several medical conditions. You may also need additional booster doses.  Hepatitis A vaccine. You may need this if you have certain conditions or if you travel or work in places where you may be exposed to hepatitis A.  Hepatitis B vaccine. You may need this if you have certain conditions or if you travel or work in places where you may be exposed to hepatitis B.  Haemophilus influenzae type b (Hib) vaccine. You may need this if  you have certain risk factors.  Talk to your health care provider about which screenings and vaccines you need and how often you need them. This information is not intended to replace advice given to you by your health care provider. Make sure you discuss any questions you have with your health care provider. Document Released: 11/24/2001 Document Revised: 06/17/2016  Document Reviewed: 07/30/2015 Elsevier Interactive Patient Education  2017 [employer].

## 2017-05-12 NOTE — Progress Notes (Signed)
Patient ID: Stacey Huynh, female   DOB: Jul 18, 1986, 31 y.o.   MRN: [id number]     Gynecology Annual Exam  PCP: Patient, No Pcp Per  Chief Complaint:  Chief Complaint  Patient presents with  . Gynecologic Exam    severe cramping w/cycles    History of Present Illness: Patient is a 31 y.o. G1P1001 presents for annual exam. The patient has no complaints today.   LMP: Patient's last menstrual period was 05/03/2017 (exact date). Has continued to have regular monthly periods since Mirena IUD placement in 2016.  However, last few cycles have been prolonged and heavy, lasting 7 days which is a recent change.  She does report associated dysmenorrhea with her cycles.  Can feel strings.  The patient is sexually active. She currently uses IUD for contraception. She denies dyspareunia.  The patient does perform self breast exams.  There is no notable family history of breast or ovarian cancer in her family.  The patient wears seatbelts: yes.   The patient has regular exercise: yes.  Ran 5k, despite increasing level of exercise has been unable to loose weight.  The patient denies current symptoms of depression.    Review of Systems: Review of Systems  Constitutional: Positive for malaise/fatigue. Negative for chills and fever.  HENT: Negative for congestion.   Respiratory: Negative for cough and shortness of breath.   Cardiovascular: Negative for chest pain and palpitations.  Gastrointestinal: Negative for abdominal pain, constipation, diarrhea, heartburn, nausea and vomiting.  Genitourinary: Negative for dysuria, frequency and urgency.  Skin: Negative for itching and rash.  Neurological: Negative for dizziness and headaches.  Endo/Heme/Allergies: Negative for polydipsia.  Psychiatric/Behavioral: Negative for depression and suicidal ideas. The patient is not nervous/anxious.     Past Medical History:  Past Medical History:  Diagnosis Date  . Abnormal Pap smear     Past Surgical  History:  Past Surgical History:  Procedure Laterality Date  . LEEP    . WISDOM TOOTH EXTRACTION      Gynecologic History:  Patient's last menstrual period was 05/03/2017 (exact date). Contraception: IUD Mirna placed by Rosenow 05/23/15 Last Pap: Results were: 05/11/16 NIL and HR HPV negative   Obstetric History: G1P1001  Family History:  Family History  Problem Relation Age of Onset  . Pancreatic cancer Paternal Aunt   . Bone cancer Maternal Grandmother   . Lung cancer Maternal Grandmother   . Colon cancer Paternal Grandfather     Social History:  Social History   Social History  . Marital status: Married    Spouse name: N/A  . Number of children: N/A  . Years of education: N/A   Occupational History  . Not on file.   Social History Main Topics  . Smoking status: Never Smoker  . Smokeless tobacco: Never Used  . Alcohol use No  . Drug use: No  . Sexual activity: Yes    Birth control/ protection: IUD   Other Topics Concern  . Not on file   Social History Narrative  . No narrative on file    Allergies:  Allergies  Allergen Reactions  . Penicillins Other (See Comments)    Unknown, childhood reaction    Medications: Prior to Admission medications   Not on File    Physical Exam Vitals: Blood pressure 106/70, pulse 92, height 5\' 6"  (1.676 m), weight 215 lb (97.5 kg), last menstrual period 05/03/2017, not currently breastfeeding.  General: NAD HEENT: normocephalic, anicteric Thyroid: no enlargement, no palpable nodules Pulmonary: No  increased work of breathing, CTAB Cardiovascular: RRR, distal pulses 2+ Breast: Breast symmetrical, no tenderness, no palpable nodules or masses, no skin or nipple retraction present, no nipple discharge.  No axillary or supraclavicular lymphadenopathy.  Tinea corporis under left breast to a less extent right breast Abdomen: NABS, soft, non-tender, non-distended.  Umbilicus without lesions.  No hepatomegaly, splenomegaly or  masses palpable. No evidence of hernia  Genitourinary:  External: Normal external female genitalia.  Normal urethral meatus, normal  Bartholin's and Skene's glands.    Vagina: Normal vaginal mucosa, no evidence of prolapse.    Cervix: Grossly normal in appearance, no bleeding, IUD strings visualized  Uterus: Non-enlarged, mobile, normal contour.  No CMT  Adnexa: ovaries non-enlarged, no adnexal masses  Rectal: deferred  Lymphatic: no evidence of inguinal lymphadenopathy Extremities: no edema, erythema, or tenderness Neurologic: Grossly intact Psychiatric: mood appropriate, affect full  Female chaperone present for pelvic and breast  portions of the physical exam    Assessment: 31 y.o. G1P1001 routine annual exam and AUB-I Plan: Problem List Items Addressed This Visit    None    Visit Diagnoses    Screening for malignant neoplasm of cervix       Relevant Orders   PapIG, HPV, rfx 16/18   Screening for diabetes mellitus       Relevant Orders   CMP14+LP+TP+TSH+CBC/Plt   Screening for lipoid disorders       Relevant Orders   CMP14+LP+TP+TSH+CBC/Plt   Breast screening       Encounter for gynecological examination without abnormal finding       Relevant Orders   PapIG, HPV, rfx 16/18   CMP14+LP+TP+TSH+CBC/Plt   Thyroid disorder screen       Relevant Orders   CMP14+LP+TP+TSH+CBC/Plt      1) STI screening was offered and declined  2) ASCCP guidelines and rational discussed.  Patient opts for yearly screening interval  3) Contraception - Currently using IUD, increased cramping and 7 days menses over the past few months - TVUS to evaluate IUD placement suspect migration into the lower uterine segment possibly given patient symptoms  4) Routine healthcare maintenance including cholesterol, diabetes screening discussed Ordered today  5) Tinea Corporis - rx nystatin  6) Follow up 1 year for routine annual exam

## 2017-05-17 ENCOUNTER — Telehealth: Payer: Self-pay

## 2017-05-17 ENCOUNTER — Encounter: Payer: Self-pay | Admitting: Obstetrics and Gynecology

## 2017-05-17 LAB — PAPIG, HPV, RFX 16/18
HPV, high-risk: NEGATIVE
PAP Smear Comment: 0

## 2017-05-17 NOTE — Telephone Encounter (Signed)
Strings were visualized I can remove the the IUD but that may not be the cause of the pain to begin with not sure if we have any sooner ultrasound openings

## 2017-05-17 NOTE — Telephone Encounter (Signed)
Pt opts to have IUD removed. Transferred to front desk for scheduling.

## 2017-05-17 NOTE — Telephone Encounter (Signed)
Pt states she is supposed to have u/s appt on 05/31/17 to check IUD placement. She woke up this morning with a lot of cramping and it hurts to sit down. Pt denies any bleeding. Has taken ibuprofen to help with pain and has alleviated some of the pain. Pt can feel strings. Please advise. Pt would like to know if she can be seen earlier. Thank you.

## 2017-05-18 LAB — CMP14+LP+TP+TSH+CBC/PLT
ALT: 16 IU/L (ref 0–32)
AST: 18 IU/L (ref 0–40)
Albumin/Globulin Ratio: 1.5 (ref 1.2–2.2)
Albumin: 4.2 g/dL (ref 3.5–5.5)
Alkaline Phosphatase: 65 IU/L (ref 39–117)
BUN/Creatinine Ratio: 10 (ref 9–23)
BUN: 9 mg/dL (ref 6–20)
Bilirubin Total: 0.5 mg/dL (ref 0.0–1.2)
CO2: 21 mmol/L (ref 20–29)
Calcium: 9.2 mg/dL (ref 8.7–10.2)
Chloride: 101 mmol/L (ref 96–106)
Cholesterol, Total: 193 mg/dL (ref 100–199)
Creatinine, Ser: 0.92 mg/dL (ref 0.57–1.00)
Free Thyroxine Index: 1.7 (ref 1.2–4.9)
GFR calc Af Amer: 95 mL/min/{1.73_m2} (ref >59)
GFR calc non Af Amer: 83 mL/min/{1.73_m2} (ref >59)
Globulin, Total: 2.8 g/dL (ref 1.5–4.5)
Glucose: 81 mg/dL (ref 65–99)
HDL: 40 mg/dL (ref >39)
Hematocrit: 41.5 % (ref 34.0–46.6)
Hemoglobin: 14.4 g/dL (ref 11.1–15.9)
LDL Calculated: 127 mg/dL — ABNORMAL HIGH (ref 0–99)
LDl/HDL Ratio: 3.2 ratio (ref 0.0–3.2)
MCH: 30.7 pg (ref 26.6–33.0)
MCHC: 34.7 g/dL (ref 31.5–35.7)
MCV: 89 fL (ref 79–97)
Platelets: 273 10*3/uL (ref 150–379)
Potassium: 4.1 mmol/L (ref 3.5–5.2)
RBC: 4.69 x10E6/uL (ref 3.77–5.28)
RDW: 13.3 % (ref 12.3–15.4)
Sodium: 139 mmol/L (ref 134–144)
T3 Uptake Ratio: 25 % (ref 24–39)
T4, Total: 6.6 ug/dL (ref 4.5–12.0)
TSH: 2.68 u[IU]/mL (ref 0.450–4.500)
Total Protein: 7 g/dL (ref 6.0–8.5)
Triglycerides: 130 mg/dL (ref 0–149)
VLDL Cholesterol Cal: 26 mg/dL (ref 5–40)
WBC: 7.9 10*3/uL (ref 3.4–10.8)

## 2017-05-20 ENCOUNTER — Encounter: Payer: Self-pay | Admitting: Obstetrics and Gynecology

## 2017-05-25 ENCOUNTER — Ambulatory Visit (INDEPENDENT_AMBULATORY_CARE_PROVIDER_SITE_OTHER): Payer: BLUE CROSS/BLUE SHIELD | Admitting: Obstetrics and Gynecology

## 2017-05-25 ENCOUNTER — Encounter: Payer: Self-pay | Admitting: Obstetrics and Gynecology

## 2017-05-25 VITALS — BP 130/80 | HR 56 | Ht 66.0 in | Wt 219.0 lb

## 2017-05-25 DIAGNOSIS — Z30432 Encounter for removal of intrauterine contraceptive device: Secondary | ICD-10-CM

## 2017-05-25 DIAGNOSIS — Z30011 Encounter for initial prescription of contraceptive pills: Secondary | ICD-10-CM

## 2017-05-25 NOTE — Progress Notes (Signed)
       GYNECOLOGY OFFICE PROCEDURE NOTE  Stacey Huynh is a 31 y.o. G1P1001 here for Mirena IUD removal placed 2 year ago. She desires removal secondary to increased cramping.  IUD Removal  Patient identified, informed consent performed, consent signed.  Patient was in the dorsal lithotomy position, normal external genitalia was noted.  A speculum was placed in the patient's vagina, normal discharge was noted, no lesions. The cervix was visualized, no lesions, no abnormal discharge.  The strings of the IUD were grasped and pulled using ring forceps. The IUD was removed in its entirety.  The IUD did appear to be relatively low in uterus with the body being visible after 1cm of the sting had been pulled. Patient tolerated the procedure well.    Patient plans OCPs for contraception. Pros and cons and systemic estrogen discussed with patient. She is not breast feeding, nor does she have any other medical contra-indications to estrogen use as listed in WHO guidelines for contraceptive use.  While effective at preventing pregnancy combination oral contraceptive pills do not prevent transmission of sexually transmitted diseases and use of barrier methods for this purpose was discussed. Rx provided, instructed on need for back up for first 14 days. Routine preventative health maintenance measures emphasized.   Vena AustriaAndreas Lindwood Mogel, MD, Merlinda FrederickFACOG [facility], [facility]

## 2017-05-31 ENCOUNTER — Other Ambulatory Visit: Payer: BLUE CROSS/BLUE SHIELD

## 2017-05-31 ENCOUNTER — Ambulatory Visit: Payer: BLUE CROSS/BLUE SHIELD | Admitting: Obstetrics and Gynecology

## 2017-06-24 ENCOUNTER — Encounter: Payer: Self-pay | Admitting: Obstetrics and Gynecology

## 2017-06-28 ENCOUNTER — Other Ambulatory Visit: Payer: Self-pay | Admitting: Obstetrics and Gynecology

## 2017-06-28 MED ORDER — NORGESTIMATE-ETH ESTRADIOL 0.25-35 MG-MCG PO TABS: 1.0000 | ORAL_TABLET | Freq: Every day | ORAL | 11 refills | Status: DC

## 2017-11-02 ENCOUNTER — Encounter: Payer: Self-pay | Admitting: Obstetrics and Gynecology

## 2018-01-21 ENCOUNTER — Ambulatory Visit
Admission: EM | Admit: 2018-01-21 | Discharge: 2018-01-21 | Disposition: A | Discharge status: Home / Self Care | Payer: BLUE CROSS/BLUE SHIELD | Attending: Family Medicine | Admitting: Family Medicine

## 2018-01-21 ENCOUNTER — Other Ambulatory Visit: Payer: Self-pay

## 2018-01-21 ENCOUNTER — Ambulatory Visit (INDEPENDENT_AMBULATORY_CARE_PROVIDER_SITE_OTHER): Payer: BLUE CROSS/BLUE SHIELD

## 2018-01-21 ENCOUNTER — Encounter: Payer: Self-pay | Admitting: Gynecology

## 2018-01-21 DIAGNOSIS — M79672 Pain in left foot: Secondary | ICD-10-CM | POA: Diagnosis not present

## 2018-01-21 DIAGNOSIS — W208XXA Other cause of strike by thrown, projected or falling object, initial encounter: Secondary | ICD-10-CM | POA: Diagnosis not present

## 2018-01-21 DIAGNOSIS — S90812A Abrasion, left foot, initial encounter: Secondary | ICD-10-CM

## 2018-01-21 DIAGNOSIS — S9032XA Contusion of left foot, initial encounter: Secondary | ICD-10-CM

## 2018-01-21 NOTE — Discharge Instructions (Addendum)
Rest. Ice. Supportive care. Gradually increase activity as tolerated.   Follow up with your primary care physician this week as needed. Return to Urgent care for new or worsening concerns.

## 2018-01-21 NOTE — ED Provider Notes (Signed)
[facility] ____________________________________________  Time seen: Approximately 7:00 PM  I have reviewed the triage vital signs and the nursing notes.   HISTORY  Chief Complaint Foot Injury   HPI Stacey Huynh is a 32 y.o. female presenting for evaluation of pain to left foot after injury that happened yesterday.  Patient reports yesterday while at work a lid of a crate fell on her foot causing the injury.  States that lid may have weighed 50-60 pounds.  Denies Worker's Compensation injury.  States his continue remain ambulatory.  Denies pain radiation, paresthesias.  Patient states does have an abrasion to that area.  Reports tetanus immunization is approximately 5 years ago.  No alleviating measures attempted today, did take some ibuprofen yesterday.  Pain worse with direct palpation and walking.  Denies other aggravating or alleviating factors.  Reports otherwise feels well. Denies recent sickness. Denies recent antibiotic use.   Patient's last menstrual period was 01/07/2018. denies pregnancy.    Past Medical History:  Diagnosis Date  . Abnormal Pap smear     There are no active problems to display for this patient.   Past Surgical History:  Procedure Laterality Date  . LEEP    . WISDOM TOOTH EXTRACTION       No current facility-administered medications for this encounter.   Current Outpatient Medications:  .  norgestimate-ethinyl estradiol (ORTHO-CYCLEN,SPRINTEC,PREVIFEM) 0.25-35 MG-MCG tablet, Take 1 tablet by mouth daily., Disp: 1 Package, Rfl: 11  Allergies Penicillins  Family History  Problem Relation Age of Onset  . Pancreatic cancer Paternal Aunt   . Bone cancer Maternal Grandmother   . Lung cancer Maternal Grandmother   . Colon cancer Paternal Grandfather     Social History Social History   Tobacco Use  . Smoking status: Never Smoker  . Smokeless tobacco: Never Used  Substance Use Topics  . Alcohol use: No  . Drug use: No     Review of Systems Constitutional: No fever/chills Cardiovascular: Denies chest pain. Respiratory: Denies shortness of breath. Gastrointestinal: No abdominal pain.  Musculoskeletal: Negative for back pain. As above Skin:as above.   ____________________________________________   PHYSICAL EXAM:  VITAL SIGNS: ED Triage Vitals  Enc Vitals Group     BP 01/21/18 1834 (!) 157/86     Pulse Rate 01/21/18 1834 99     Resp -- 16     Temp 01/21/18 1834 98.5 F (36.9 C)     Temp Source 01/21/18 1834 Oral     SpO2 01/21/18 1834 100 %     Weight 01/21/18 1832 200 lb (90.7 kg)     Height 01/21/18 1832 5\' 6"  (1.676 m)     Head Circumference --      Peak Flow --      Pain Score 01/21/18 1832 5     Pain Loc --      Pain Edu? --      Excl. in GC? --     Constitutional: Alert and oriented. Well appearing and in no acute distress. Cardiovascular:  Good peripheral circulation. Respiratory: Normal respiratory effort without tachypnea nor retractions.  Musculoskeletal:  Bilateral pedal pulses equal and easily palpated.  Except: Left dorsal foot distal second third and fourth metacarpal mild to moderate tenderness to palpation, 2cm superficial abrasion noted to distal third metatarsal, no swelling, no surrounding erythema, normal distal sensation and capillary refill, no motor or tendon deficit noted.  Amatory with steady gait. Neurologic:  Normal speech and language. Speech is normal. No gait instability.  Skin:  Skin is warm, dry and intact. No rash noted. Psychiatric: Mood and affect are normal. Speech and behavior are normal. Patient exhibits appropriate insight and judgment   ___________________________________________   LABS (all labs ordered are listed, but only abnormal results are displayed)  Labs Reviewed - No data to display ____________________________________________  RADIOLOGY  Dg Foot Complete Left  Result Date: 01/21/2018 CLINICAL DATA:  Dropped heavy object on foot  with laceration EXAM: LEFT FOOT - COMPLETE 3+ VIEW COMPARISON:  None. FINDINGS: There is no evidence of fracture or dislocation. There is no evidence of arthropathy or other focal bone abnormality. Soft tissues are unremarkable. IMPRESSION: Negative. Electronically Signed   By: Jasmine Pang M.D.   On: 01/21/2018 19:21   ____________________________________________  INITIAL IMPRESSION / ASSESSMENT AND PLAN / ED COURSE  Pertinent labs & imaging results that were available during my care of the patient were reviewed by me and considered in my medical decision making (see chart for details).  Well-appearing patient.  No acute distress.  Presenting for evaluation of left foot pain post injury that occurred yesterday at work.  Denies Worker's Comp.  Left foot x-ray negative per radiologist as above and reviewed by myself.  Encourage rest, ice, supportive care.  Contusion and abrasion injuries, discussed keeping clean.  Discussed follow up with Primary care physician this week. Discussed follow up and return parameters including no resolution or any worsening concerns. Patient verbalized understanding and agreed to plan.   ____________________________________________   FINAL CLINICAL IMPRESSION(S) / ED DIAGNOSES  Final diagnoses:  Contusion of left foot, initial encounter  Abrasion of left foot, initial encounter     ED Discharge Orders    None       Note: This dictation was prepared with Dragon dictation along with smaller phrase technology. Any transcriptional errors that result from this process are unintentional.         Renford Dills, NP 01/21/18 2006

## 2018-01-21 NOTE — ED Triage Notes (Signed)
Per patient c/o crate fell on her left foot while at work x yesterday.Per patient now with left foot pain.

## 2018-03-04 ENCOUNTER — Ambulatory Visit
Admission: EM | Admit: 2018-03-04 | Discharge: 2018-03-04 | Disposition: A | Discharge status: Home / Self Care | Payer: BLUE CROSS/BLUE SHIELD | Attending: Family Medicine | Admitting: Family Medicine

## 2018-03-04 ENCOUNTER — Other Ambulatory Visit: Payer: Self-pay

## 2018-03-04 DIAGNOSIS — J029 Acute pharyngitis, unspecified: Secondary | ICD-10-CM | POA: Diagnosis not present

## 2018-03-04 DIAGNOSIS — R509 Fever, unspecified: Secondary | ICD-10-CM

## 2018-03-04 LAB — RAPID STREP SCREEN (MED CTR MEBANE ONLY): Streptococcus, Group A Screen (Direct): NEGATIVE

## 2018-03-04 MED ORDER — AZITHROMYCIN 250 MG PO TABS: 250.0000 mg | ORAL_TABLET | Freq: Every day | ORAL | 0 refills | Status: DC

## 2018-03-04 NOTE — Discharge Instructions (Signed)
Antibiotic as prescribed.  Take care  Dr. Nishka Heide  

## 2018-03-04 NOTE — ED Provider Notes (Signed)
[facility]    CSN: [id number] Arrival date & time: 03/04/18  9629  History   Chief Complaint Chief Complaint  Patient presents with  . Sore Throat   HPI  32 year old female presents with the above complaint.  Patient states that she has had sore throat for the past few days.  This morning she developed fever, T-max 101.  Her daughter is also sick with similar symptoms.  Her son has recently been sick as well and was treated with anabolic therapy a few days ago.  No other respiratory symptoms.  No known exacerbating or relieving factors.  No other complaints or concerns at this time.  Social History Social History   Tobacco Use  . Smoking status: Never Smoker  . Smokeless tobacco: Never Used  Substance Use Topics  . Alcohol use: No  . Drug use: No    Allergies   Penicillins  Review of Systems Review of Systems  Constitutional: Positive for fever.  HENT: Positive for sore throat.    Physical Exam Triage Vital Signs ED Triage Vitals [03/04/18 0829]  Enc Vitals Group     BP (!) 146/87     Pulse Rate (!) 106     Resp 16     Temp 98.6 F (37 C)     Temp Source Oral     SpO2 100 %     Weight 200 lb (90.7 kg)     Height  (1.676 m)     Head Circumference      Peak Flow      Pain Score 4     Pain Loc      Pain Edu?      Excl. in GC?     Updated Vital Signs BP (!) 146/87 (BP Location: Left Arm)   Pulse (!) 106   Temp 98.6 F (37 C) (Oral)   Resp 16   Ht  (1.676 m)   Wt 200 lb (90.7 kg)   LMP 02/25/2018   SpO2 100%   BMI 32.28 kg/m   Physical Exam  Constitutional: She is oriented to person, place, and time. She appears well-developed. No distress.  HENT:  Head: Normocephalic and atraumatic.  Oropharynx with moderate erythema.  Slight exudate noted on the right tonsil.  Eyes: Conjunctivae are normal. Right eye exhibits no discharge. Left eye exhibits no discharge.  Neck: Neck supple.  Cardiovascular:  Tachycardic.  Regular  rhythm.  Pulmonary/Chest: Effort normal and breath sounds normal. She has no wheezes. She has no rales.  Lymphadenopathy:    She has cervical adenopathy.  Neurological: She is alert and oriented to person, place, and time.  Psychiatric: She has a normal mood and affect. Her behavior is normal.  Nursing note and vitals reviewed.  UC Treatments / Results  Labs (all labs ordered are listed, but only abnormal results are displayed) Labs Reviewed  RAPID STREP SCREEN ([facility] ONLY)  CULTURE, GROUP A STREP [facility])    EKG None  Radiology No results found.  Procedures Procedures (including critical care time)  Medications Ordered in UC Medications - No data to display  Initial Impression / Assessment and Plan / UC Course  I have reviewed the triage vital signs and the nursing notes.  Pertinent labs & imaging results that were available during my care of the patient were reviewed by me and considered in my medical decision making (see chart for details).    32 year old female presents with fever and sore throat.  Rapid strep is negative.  However, I am treating her empirically with antibiotics while awaiting culture.  She has had exposure to her son and her daughter who have had similar symptoms.  Son has responded to antibiotics.  Final Clinical Impressions(s) / UC Diagnoses   Final diagnoses:  Pharyngitis, unspecified etiology     Discharge Instructions     Antibiotic as prescribed.  Take care  Dr. Adriana Simas     ED Prescriptions    Medication Sig Dispense Auth. Provider   azithromycin (ZITHROMAX) 250 MG tablet Take 1 tablet (250 mg total) by mouth daily. Take first 2 tablets together, then 1 every day until finished. 6 tablet Tommie Sams, DO     Controlled Substance Prescriptions [place] Controlled Substance Registry consulted? Not Applicable   Tommie Sams, [place] 03/04/18 [phone]

## 2018-03-04 NOTE — ED Triage Notes (Signed)
Patient complains of fever and sore throat since this morning. Son Strep positive a few days ago.

## 2018-03-06 LAB — CULTURE, GROUP A STREP (THRC)

## 2018-03-24 ENCOUNTER — Encounter: Payer: Self-pay | Admitting: Nurse Practitioner

## 2018-03-24 ENCOUNTER — Ambulatory Visit (INDEPENDENT_AMBULATORY_CARE_PROVIDER_SITE_OTHER): Payer: BLUE CROSS/BLUE SHIELD | Admitting: Nurse Practitioner

## 2018-03-24 ENCOUNTER — Other Ambulatory Visit: Payer: Self-pay

## 2018-03-24 VITALS — BP 145/85 | HR 80 | Temp 98.2°F | Ht 66.0 in | Wt 213.8 lb

## 2018-03-24 DIAGNOSIS — Z7689 Persons encountering health services in other specified circumstances: Secondary | ICD-10-CM

## 2018-03-24 DIAGNOSIS — I10 Essential (primary) hypertension: Secondary | ICD-10-CM | POA: Diagnosis not present

## 2018-03-24 MED ORDER — LISINOPRIL 10 MG PO TABS: 10.0000 mg | ORAL_TABLET | Freq: Every day | ORAL | 5 refills | Status: DC

## 2018-03-24 NOTE — Progress Notes (Signed)
Subjective:    Patient ID: Stacey Huynh, female    DOB: 1986/09/16, 32 y.o.   MRN: [id number]  Stacey Neriina H Juenger is a 32 y.o. female presenting on 03/24/2018 for Establish Care (elevated blood pressure in the past )   HPI Establish Care New Provider Pt last seen by PCP Dr. Feliberto GottronSchermerhorn approximately 5 years ago.  Obtain records from Epic for most recent care received at GYN from Dr. Bonney AidStaebler K[facility] [place]([facility]).   Most recently has been working on fertility, diagnosed with PCOS, now returning to hormonal contraception with next menses.   Blood Pressure Pt presents today with several elevated BP readings in clinic/urgent care and validated at [facility] outside of clinics.  Readings 140-150/90s - Current medications: none,   - She is not currently symptomatic. - Pt denies headache, lightheadedness, dizziness, changes in vision, chest tightness/pressure, palpitations, leg swelling, sudden loss of speech or loss of consciousness. - She  reports no regular exercise routine. - Her diet is high in salt, high in fat, and high in carbohydrates.  Eats meals out almost every evening: [employer] and other fast food with combo meals and frequent french fries.  Eats mostly sandwiches and chips for lunch.  Breakfast is usually oatmeal or pop-tart.   Past Medical History:  Diagnosis Date  . Abnormal Pap smear   . Abnormal Pap smear of cervix   . Anxiety    Past Surgical History:  Procedure Laterality Date  . LEEP    . WISDOM TOOTH EXTRACTION     Social History   Socioeconomic History  . Marital status: Married    Spouse name: Not on file  . Number of children: Not on file  . Years of education: Not on file  . Highest education level: Not on file  Occupational History  . Not on file  Social Needs  . Financial resource strain: Not on file  . Food insecurity:    Worry: Not on file    Inability: Not on file  . Transportation needs:    Medical: Not on file    Non-medical: Not on file    Tobacco Use  . Smoking status: Never Smoker  . Smokeless tobacco: Never Used  Substance and Sexual Activity  . Alcohol use: No  . Drug use: No  . Sexual activity: Yes    Birth control/protection: Pill  Lifestyle  . Physical activity:    Days per week: Not on file    Minutes per session: Not on file  . Stress: Not on file  Relationships  . Social connections:    Talks on phone: Not on file    Gets together: Not on file    Attends religious service: Not on file    Active member of club or organization: Not on file    Attends meetings of clubs or organizations: Not on file    Relationship status: Not on file  . Intimate partner violence:    Fear of current or ex partner: Not on file    Emotionally abused: Not on file    Physically abused: Not on file    Forced sexual activity: Not on file  Other Topics Concern  . Not on file  Social History Narrative  . Not on file   Family History  Problem Relation Age of Onset  . Pancreatic cancer Paternal Aunt   . Bone cancer Maternal Grandmother   . Lung cancer Maternal Grandmother   . Colon cancer Paternal Grandfather   . Depression Mother   .  Depression Father   . Suicidality Father    Current Outpatient Medications on File Prior to Visit  Medication Sig  . norgestimate-ethinyl estradiol (ORTHO-CYCLEN,SPRINTEC,PREVIFEM) 0.25-35 MG-MCG tablet Take 1 tablet by mouth daily.   No current facility-administered medications on file prior to visit.     Review of Systems  Constitutional: Negative for chills and fever.  HENT: Negative for congestion and sore throat.   Eyes: Negative for pain.  Respiratory: Negative for cough, shortness of breath and wheezing.   Cardiovascular: Negative for chest pain, palpitations and leg swelling.  Gastrointestinal: Negative for abdominal pain, blood in stool, constipation, diarrhea, nausea and vomiting.  Endocrine: Negative for polydipsia.  Genitourinary: Negative for dysuria, frequency, hematuria  and urgency.  Musculoskeletal: Negative for back pain, myalgias and neck pain.  Skin: Negative.  Negative for rash.  Allergic/Immunologic: Negative for environmental allergies.  Neurological: Negative for dizziness, weakness and headaches.  Hematological: Does not bruise/bleed easily.  Psychiatric/Behavioral: Negative for dysphoric mood and suicidal ideas. The patient is not nervous/anxious.    Per HPI unless specifically indicated above     Objective:    BP (!) 145/85 (BP Location: Right Arm, Patient Position: Sitting, Cuff Size: Normal)   Pulse 80   Temp 98.2 F (36.8 C) (Oral)   Ht 5\' 6"  (1.676 m)   Wt 213 lb 12.8 oz (97 kg)   LMP 03/23/2018   BMI 34.51 kg/m   Wt Readings from Last 3 Encounters:  03/24/18 213 lb 12.8 oz (97 kg)  03/04/18 200 lb (90.7 kg)  01/21/18 200 lb (90.7 kg)    Physical Exam  Constitutional: She is oriented to person, place, and time. She appears well-developed and well-nourished. No distress.  HENT:  Head: Normocephalic and atraumatic.  Neck: Normal range of motion. Neck supple.  Cardiovascular: Normal rate, regular rhythm, S1 normal, S2 normal, normal heart sounds and intact distal pulses.  Pulses:      Radial pulses are 2+ on the right side, and 2+ on the left side.       Dorsalis pedis pulses are 2+ on the right side, and 2+ on the left side.       Posterior tibial pulses are 2+ on the right side, and 2+ on the left side.  Pulmonary/Chest: Effort normal and breath sounds normal. No respiratory distress.  Musculoskeletal: She exhibits no edema (pedal).  Neurological: She is alert and oriented to person, place, and time.  Skin: Skin is warm and dry. Capillary refill takes less than 2 seconds.  Psychiatric: She has a normal mood and affect. Her behavior is normal. Judgment and thought content normal.  Vitals reviewed.    Results for orders placed or performed during the hospital encounter of 03/04/18  Rapid Strep Screen ([facility] ONLY)    Result Value Ref Range   Streptococcus, Group A Screen (Direct) NEGATIVE NEGATIVE  Culture, group A strep  Result Value Ref Range   Specimen Description      THROAT Performed at [facility], [address], [place], [place] [address]    Special Requests      NONE Reflexed from [phone] Performed at [facility], [address], [place], [place] [address]    Culture      NO GROUP A STREP (S.PYOGENES) ISOLATED Performed at [facility], [address] [place], [place] [address]    Report Status 03/06/2018 FINAL       Assessment & Plan:   Problem List Items  Addressed This Visit      Cardiovascular and Mediastinum   Essential hypertension - Primary    Currently uncontrolled hypertension, newly diagnosed Stage 2 hypertension with > 2 readings over 140/90.  BP goal < 130/80.  Pt is not currently working on lifestyle modifications.  Taking medications tolerating well without side effects. No currently identified complications.  Plan: 1. START lisinopril 10 mg once daily.  Discussed possible side effects of angioedema (rare), cough (common and reversible), kidney damage (rare, increase monitoring with start). 2. Obtain labs BMP tomorrow am and in 2 weeks after starting lisinopril.  3. Encouraged heart healthy diet and increasing exercise to 30 minutes most days of the week. 4. Check BP 1-2 x per week at home, keep log, and bring to clinic at next appointment. 5. Follow up 4 weeks then, if stable, every 3-6 months.        Relevant Medications   lisinopril (PRINIVIL,ZESTRIL) 10 MG tablet   Other Relevant Orders   BASIC METABOLIC PANEL WITH GFR   BASIC METABOLIC PANEL WITH GFR    Other Visit Diagnoses    Encounter to establish care         Previous PCP was at [facility] or [facility] and [employer].  Records are reviewed in Epic.  Past medical, family, and surgical history reviewed w/ pt.   Meds ordered this encounter  Medications  .  lisinopril (PRINIVIL,ZESTRIL) 10 MG tablet    Sig: Take 1 tablet (10 mg total) by mouth daily.    Dispense:  30 tablet    Refill:  5    Order Specific Question:   Supervising Provider    Answer:   Smitty Cords [[id number]]     Follow up plan: Return in about 1 month (around 04/21/2018) for hypertension AND 2 weeks for labs.  Wilhelmina Mcardle, DNP, AGPCNP-BC Adult Gerontology Primary Care Nurse Practitioner [facility] 03/25/2018, 8:22 AM

## 2018-03-24 NOTE — Patient Instructions (Addendum)
Stacey Huynh "Stacey Huynh",   Thank you for coming in to clinic today.  1. START lisinopril 10 mg once daily.   Some of the possible side effects are:  - angioedema: swelling of lips, mouth, and tongue.  If this rare side effect occurs, please go to ED. - cough: you could develop a dry, hacking cough caused by this medicine.  If it occurs, it will go away after stopping this medicine.  Call the clinic before stopping the medication. - kidney damage: we will monitor your labs when we start this medicine and at least one time per year.  If you do not have an change in kidney function when starting this medicine, it will provide kidney protection over time.  Please schedule a follow-up appointment with Stacey Huynh, AGNP. Return in about 1 month (around 04/21/2018) for hypertension AND 2 weeks for labs.  If you have any other questions or concerns, please feel free to call the clinic or send a message through MyChart. You may also schedule an earlier appointment if necessary.  You will receive a survey after today's visit either digitally by e-mail or paper by [employer]. Your experiences and feedback matter to [place].  Please respond so we know how we are doing as we provide care for you.   Stacey Mcardle, DNP, AGNP-BC Adult Gerontology Nurse Practitioner [facility], [facility]   Managing Your Hypertension Hypertension is commonly called high blood pressure. This is when the force of your blood pressing against the walls of your arteries is too strong. Arteries are blood vessels that carry blood from your heart throughout your body. Hypertension forces the heart to work harder to pump blood, and may cause the arteries to become narrow or stiff. Having untreated or uncontrolled hypertension can cause heart attack, stroke, kidney disease, and other problems. What are blood pressure readings? A blood pressure reading consists of a higher number over a lower number. Ideally, your  blood pressure should be below 120/80. The first ("top") number is called the systolic pressure. It is a measure of the pressure in your arteries as your heart beats. The second ("bottom") number is called the diastolic pressure. It is a measure of the pressure in your arteries as the heart relaxes. What does my blood pressure reading mean? Blood pressure is classified into four stages. Based on your blood pressure reading, your health care provider may use the following stages to determine what type of treatment you need, if any. Systolic pressure and diastolic pressure are measured in a unit called mm Hg. Normal  Systolic pressure: below 120.  Diastolic pressure: below 80. Elevated  Systolic pressure: 120-129.  Diastolic pressure: below 80. Hypertension stage 1  Systolic pressure: 130-139.  Diastolic pressure: 80-89. Hypertension stage 2  Systolic pressure: 140 or above.  Diastolic pressure: 90 or above. What health risks are associated with hypertension? Managing your hypertension is an important responsibility. Uncontrolled hypertension can lead to:  A heart attack.  A stroke.  A weakened blood vessel (aneurysm).  Heart failure.  Kidney damage.  Eye damage.  Metabolic syndrome.  Memory and concentration problems.  What changes can I make to manage my hypertension? Hypertension can be managed by making lifestyle changes and possibly by taking medicines. Your health care provider will help you make a plan to bring your blood pressure within a normal range. Eating and drinking  Eat a diet that is high in fiber and potassium, and low in salt (sodium), added sugar,  and fat. An example eating plan is called the DASH (Dietary Approaches to Stop Hypertension) diet. To eat this way: ? Eat plenty of fresh fruits and vegetables. Try to fill half of your plate at each meal with fruits and vegetables. ? Eat whole grains, such as whole wheat pasta, brown rice, or whole grain  bread. Fill about one quarter of your plate with whole grains. ? Eat low-fat diary products. ? Avoid fatty cuts of meat, processed or cured meats, and poultry with skin. Fill about one quarter of your plate with lean proteins such as fish, chicken without skin, beans, eggs, and tofu. ? Avoid premade and processed foods. These tend to be higher in sodium, added sugar, and fat.  Reduce your daily sodium intake. Most people with hypertension should eat less than 1,500 mg of sodium a day.  Limit alcohol intake to no more than 1 drink a day for nonpregnant women and 2 drinks a day for men. One drink equals 12 oz of beer, 5 oz of wine, or 1 oz of hard liquor. Lifestyle  Work with your health care provider to maintain a healthy body weight, or to lose weight. Ask what an ideal weight is for you.  Get at least 30 minutes of exercise that causes your heart to beat faster (aerobic exercise) most days of the week. Activities may include walking, swimming, or biking.  Include exercise to strengthen your muscles (resistance exercise), such as weight lifting, as part of your weekly exercise routine. Try to do these types of exercises for 30 minutes at least 3 days a week.  Do not use any products that contain nicotine or tobacco, such as cigarettes and e-cigarettes. If you need help quitting, ask your health care provider.  Control any long-term (chronic) conditions you have, such as high cholesterol or diabetes. Monitoring  Monitor your blood pressure at home as told by your health care provider. Your personal target blood pressure may vary depending on your medical conditions, your age, and other factors.  Have your blood pressure checked regularly, as often as told by your health care provider. Working with your health care provider  Review all the medicines you take with your health care provider because there may be side effects or interactions.  Talk with your health care provider about your  diet, exercise habits, and other lifestyle factors that may be contributing to hypertension.  Visit your health care provider regularly. Your health care provider can help you create and adjust your plan for managing hypertension. Will I need medicine to control my blood pressure? Your health care provider may prescribe medicine if lifestyle changes are not enough to get your blood pressure under control, and if:  Your systolic blood pressure is 130 or higher.  Your diastolic blood pressure is 80 or higher.  Take medicines only as told by your health care provider. Follow the directions carefully. Blood pressure medicines must be taken as prescribed. The medicine does not work as well when you skip doses. Skipping doses also puts you at risk for problems. Contact a health care provider if:  You think you are having a reaction to medicines you have taken.  You have repeated (recurrent) headaches.  You feel dizzy.  You have swelling in your ankles.  You have trouble with your vision. Get help right away if:  You develop a severe headache or confusion.  You have unusual weakness or numbness, or you feel faint.  You have severe pain in your chest  or abdomen.  You vomit repeatedly.  You have trouble breathing. Summary  Hypertension is when the force of blood pumping through your arteries is too strong. If this condition is not controlled, it may put you at risk for serious complications.  Your personal target blood pressure may vary depending on your medical conditions, your age, and other factors. For most people, a normal blood pressure is less than 120/80.  Hypertension is managed by lifestyle changes, medicines, or both. Lifestyle changes include weight loss, eating a healthy, low-sodium diet, exercising more, and limiting alcohol. This information is not intended to replace advice given to you by your health care provider. Make sure you discuss any questions you have with your  health care provider. Document Released: 06/22/2012 Document Revised: 08/26/2016 Document Reviewed: 08/26/2016 Elsevier Interactive Patient Education  [employer]2018 Elsevier Inc.

## 2018-03-25 ENCOUNTER — Encounter: Payer: Self-pay | Admitting: Nurse Practitioner

## 2018-03-25 ENCOUNTER — Other Ambulatory Visit: Payer: BLUE CROSS/BLUE SHIELD

## 2018-03-25 DIAGNOSIS — I10 Essential (primary) hypertension: Secondary | ICD-10-CM | POA: Diagnosis not present

## 2018-03-25 LAB — BASIC METABOLIC PANEL WITH GFR
BUN: 12 mg/dL (ref 7–25)
CO2: 27 mmol/L (ref 20–32)
Calcium: 9.1 mg/dL (ref 8.6–10.2)
Chloride: 104 mmol/L (ref 98–110)
Creat: 0.79 mg/dL (ref 0.50–1.10)
GFR, Est African American: 114 mL/min/{1.73_m2} (ref >=60)
GFR, Est Non African American: 98 mL/min/{1.73_m2} (ref >=60)
Glucose, Bld: 89 mg/dL (ref 65–99)
Potassium: 4.2 mmol/L (ref 3.5–5.3)
Sodium: 139 mmol/L (ref 135–146)

## 2018-03-25 NOTE — Assessment & Plan Note (Signed)
Currently uncontrolled hypertension, newly diagnosed Stage 2 hypertension with > 2 readings over 140/90.  BP goal < 130/80.  Pt is not currently working on lifestyle modifications.  Taking medications tolerating well without side effects. No currently identified complications.  Plan: 1. START lisinopril 10 mg once daily.  Discussed possible side effects of angioedema (rare), cough (common and reversible), kidney damage (rare, increase monitoring with start). 2. Obtain labs BMP tomorrow am and in 2 weeks after starting lisinopril.  3. Encouraged heart healthy diet and increasing exercise to 30 minutes most days of the week. 4. Check BP 1-2 x per week at home, keep log, and bring to clinic at next appointment. 5. Follow up 4 weeks then, if stable, every 3-6 months.

## 2018-03-31 ENCOUNTER — Encounter: Payer: Self-pay | Admitting: Nurse Practitioner

## 2018-04-08 ENCOUNTER — Ambulatory Visit: Payer: Self-pay | Admitting: Nurse Practitioner

## 2018-04-08 ENCOUNTER — Other Ambulatory Visit: Payer: BLUE CROSS/BLUE SHIELD

## 2018-04-08 DIAGNOSIS — I10 Essential (primary) hypertension: Secondary | ICD-10-CM | POA: Diagnosis not present

## 2018-04-08 LAB — BASIC METABOLIC PANEL WITH GFR
BUN: 12 mg/dL (ref 7–25)
CO2: 24 mmol/L (ref 20–32)
Calcium: 9.3 mg/dL (ref 8.6–10.2)
Chloride: 106 mmol/L (ref 98–110)
Creat: 0.77 mg/dL (ref 0.50–1.10)
GFR, Est African American: 118 mL/min/{1.73_m2} (ref >=60)
GFR, Est Non African American: 101 mL/min/{1.73_m2} (ref >=60)
Glucose, Bld: 92 mg/dL (ref 65–99)
Potassium: 4.5 mmol/L (ref 3.5–5.3)
Sodium: 139 mmol/L (ref 135–146)

## 2018-04-21 ENCOUNTER — Other Ambulatory Visit: Payer: Self-pay

## 2018-04-21 ENCOUNTER — Ambulatory Visit (INDEPENDENT_AMBULATORY_CARE_PROVIDER_SITE_OTHER): Payer: BLUE CROSS/BLUE SHIELD | Admitting: Nurse Practitioner

## 2018-04-21 ENCOUNTER — Encounter: Payer: Self-pay | Admitting: Nurse Practitioner

## 2018-04-21 DIAGNOSIS — I10 Essential (primary) hypertension: Secondary | ICD-10-CM | POA: Diagnosis not present

## 2018-04-21 MED ORDER — LISINOPRIL 10 MG PO TABS: 10.0000 mg | ORAL_TABLET | Freq: Every day | ORAL | 1 refills | Status: DC

## 2018-04-21 NOTE — Patient Instructions (Addendum)
Stacey Huynh "Stacey Huynh",   Thank you for coming in to clinic today.  1. Great work with lifestyle!! This is improving your blood pressure as well.  2. Continue lisinopril 10 mg once daily.  3. Goal BP < 130/80 and higher than 100/50 to stay on medications and without dizziness, fatigue.  Please schedule a follow-up appointment with Wilhelmina McardleLauren Bertie Mcconathy, AGNP. Return in about 6 months (around 10/22/2018) for hypertension OR sooner if needed.  If you have any other questions or concerns, please feel free to call the clinic or send a message through MyChart. You may also schedule an earlier appointment if necessary.  You will receive a survey after today's visit either digitally by e-mail or paper by [employer] mail. Your experiences and feedback matter to us.  Please respond so we know how we are doing as we provide care for you.   Wilhelmina McardleLauren Dwaine Pringle, DNP, AGNP-BC Adult Gerontology Nurse Practitioner [facility], [facility]

## 2018-04-21 NOTE — Progress Notes (Signed)
Subjective:    Patient ID: Stacey Huynh, female    DOB: Mar 01, 1986, 32 y.o.   MRN: [id number]  Stacey Neriina H Farabee is a 32 y.o. female presenting on 04/21/2018 for Hypertension (pt notice improvement w/ blood pressure medication)   HPI Hypertension - She is checking BP at home or outside of clinic.   - Current medications: lisinopril 10 mg daily, tolerating well without side effects - She is not currently symptomatic. - Pt denies headache, lightheadedness, dizziness, changes in vision, chest tightness/pressure, palpitations, leg swelling, sudden loss of speech or loss of consciousness. - She has had an increase in activity, improved diet.  Social History   Tobacco Use  . Smoking status: Never Smoker  . Smokeless tobacco: Never Used  Substance Use Topics  . Alcohol use: No  . Drug use: No    Review of Systems Per HPI unless specifically indicated above     Objective:    BP (!) 116/45 (BP Location: Right Arm, Patient Position: Sitting, Cuff Size: Normal)   Pulse (!) 59   Temp 98.3 F (36.8 C) (Oral)   Ht 5\' 6"  (1.676 m)   Wt 205 lb 9.6 oz (93.3 kg)   LMP 04/21/2018   BMI 33.18 kg/m   Wt Readings from Last 3 Encounters:  04/21/18 205 lb 9.6 oz (93.3 kg)  03/24/18 213 lb 12.8 oz (97 kg)  03/04/18 200 lb (90.7 kg)    Physical Exam  Constitutional: She is oriented to person, place, and time. She appears well-developed and well-nourished. No distress.  HENT:  Head: Normocephalic and atraumatic.  Cardiovascular: Normal rate, regular rhythm, S1 normal, S2 normal, normal heart sounds and intact distal pulses.  Pulmonary/Chest: Effort normal and breath sounds normal. No respiratory distress.  Neurological: She is alert and oriented to person, place, and time.  Skin: Skin is warm and dry.  Psychiatric: She has a normal mood and affect. Her behavior is normal.  Vitals reviewed.   Results for orders placed or performed in visit on 04/08/18  BASIC METABOLIC PANEL WITH  GFR  Result Value Ref Range   Glucose, Bld 92 65 - 99 mg/dL   BUN 12 7 - 25 mg/dL   Creat 4.090.77 8.110.50 - 9.141.10 mg/dL   GFR, Est Non African American 101 > OR = 60 mL/min/1.4773m2   GFR, Est African American 118 > OR = 60 mL/min/1.5273m2   BUN/Creatinine Ratio NOT APPLICABLE 6 - 22 (calc)   Sodium 139 135 - 146 mmol/L   Potassium 4.5 3.5 - 5.3 mmol/L   Chloride 106 98 - 110 mmol/L   CO2 24 20 - 32 mmol/L   Calcium 9.3 8.6 - 10.2 mg/dL      Assessment & Plan:   Problem List Items Addressed This Visit      Cardiovascular and Mediastinum   Essential hypertension    Improving BP control.  Reviewed BP goal < 130/80.  If lower than 100/50 on medication, call clinic to have adjustment in meds.  Follow-up 6 months.   Meds ordered this encounter  Medications  . lisinopril (PRINIVIL,ZESTRIL) 10 MG tablet    Sig: Take 1 tablet (10 mg total) by mouth daily.    Dispense:  90 tablet    Refill:  1    Order Specific Question:   Supervising Provider    Answer:   Smitty CordsKARAMALEGOS, ALEXANDER J [[id number]]   Follow up plan: Return in about 6 months (around 10/22/2018) for hypertension OR sooner if needed.  Wilhelmina McardleLauren Lacara Dunsworth,  DNP, AGPCNP-BC Adult Gerontology Primary Care Nurse Practitioner [facility] 04/21/2018, 4:48 PM

## 2018-06-06 ENCOUNTER — Ambulatory Visit: Payer: Self-pay | Admitting: Obstetrics and Gynecology

## 2018-06-06 ENCOUNTER — Other Ambulatory Visit: Payer: Self-pay

## 2018-06-06 ENCOUNTER — Ambulatory Visit (INDEPENDENT_AMBULATORY_CARE_PROVIDER_SITE_OTHER): Payer: BLUE CROSS/BLUE SHIELD | Admitting: Nurse Practitioner

## 2018-06-06 ENCOUNTER — Encounter: Payer: Self-pay | Admitting: Nurse Practitioner

## 2018-06-06 VITALS — BP 131/64 | HR 96 | Temp 98.3°F | Resp 17 | Ht 66.0 in | Wt 202.5 lb

## 2018-06-06 DIAGNOSIS — J351 Hypertrophy of tonsils: Secondary | ICD-10-CM | POA: Diagnosis not present

## 2018-06-06 DIAGNOSIS — R05 Cough: Secondary | ICD-10-CM

## 2018-06-06 DIAGNOSIS — R059 Cough, unspecified: Secondary | ICD-10-CM

## 2018-06-06 MED ORDER — BENZONATATE 200 MG PO CAPS: 200.0000 mg | ORAL_CAPSULE | Freq: Two times a day (BID) | ORAL | 0 refills | Status: DC | PRN

## 2018-06-06 NOTE — Progress Notes (Signed)
Subjective:    Patient ID: Stacey Huynh, female    DOB: 1986/01/10, 32 y.o.   MRN: [id number]  Stacey Huynh is a 32 y.o. female presenting on 06/06/2018 for Cough (chest congestion x 4 days )   HPI Cough/URI symptoms Symptom onset occurred 4 days ago. Bad cough started Thursday.  Cough has continued.  Friday/Saturday also lost voice.  No intermittently loses voice.  No congestion.  No production.  No head congestion or pressure.  Chest tightness with deep breathing.   Did feel sick with fever, chills, sweats which are improving.    Social History   Tobacco Use  . Smoking status: Never Smoker  . Smokeless tobacco: Never Used  Substance Use Topics  . Alcohol use: No  . Drug use: No    Review of Systems Per HPI unless specifically indicated above     Objective:    BP 131/64 (BP Location: Right Arm, Patient Position: Sitting, Cuff Size: Normal)   Pulse 96   Temp 98.3 F (36.8 C) (Oral)   Resp 17   Ht 5\' 6"  (1.676 m)   Wt 202 lb 8 oz (91.9 kg)   SpO2 100%   BMI 32.68 kg/m   Wt Readings from Last 3 Encounters:  06/06/18 202 lb 8 oz (91.9 kg)  04/21/18 205 lb 9.6 oz (93.3 kg)  03/24/18 213 lb 12.8 oz (97 kg)    Physical Exam  Constitutional: She is oriented to person, place, and time. She appears well-developed and well-nourished. No distress.  HENT:  Head: Normocephalic and atraumatic.  Right Ear: Hearing, tympanic membrane, external ear and ear canal normal.  Left Ear: Hearing, tympanic membrane, external ear and ear canal normal.  Nose: Nose normal. Right sinus exhibits no maxillary sinus tenderness and no frontal sinus tenderness. Left sinus exhibits no maxillary sinus tenderness and no frontal sinus tenderness.  Mouth/Throat: Uvula is midline and mucous membranes are normal. Posterior oropharyngeal edema and posterior oropharyngeal erythema present. Tonsils are 3+ on the right. Tonsils are 3+ on the left.  Mallampati Score  1 - Complete visualization of  entire oropharynx soft palate  Cryptic tonsils present.  No exudates noted.  Cardiovascular: Normal rate, regular rhythm, S1 normal, S2 normal, normal heart sounds and intact distal pulses.  Pulmonary/Chest: Effort normal and breath sounds normal. No respiratory distress.  Neurological: She is alert and oriented to person, place, and time.  Skin: Skin is warm and dry. Capillary refill takes less than 2 seconds.  Psychiatric: She has a normal mood and affect. Her behavior is normal. Judgment and thought content normal.  Vitals reviewed.    Results for orders placed or performed in visit on 04/08/18  BASIC METABOLIC PANEL WITH GFR  Result Value Ref Range   Glucose, Bld 92 65 - 99 mg/dL   BUN 12 7 - 25 mg/dL   Creat 4.090.77 8.110.50 - 9.141.10 mg/dL   GFR, Est Non African American 101 > OR = 60 mL/min/1.6773m2   GFR, Est African American 118 > OR = 60 mL/min/1.8973m2   BUN/Creatinine Ratio NOT APPLICABLE 6 - 22 (calc)   Sodium 139 135 - 146 mmol/L   Potassium 4.5 3.5 - 5.3 mmol/L   Chloride 106 98 - 110 mmol/L   CO2 24 20 - 32 mmol/L   Calcium 9.3 8.6 - 10.2 mg/dL      Assessment & Plan:   Problem List Items Addressed This Visit    None    Visit Diagnoses  Swelling of tonsil    -  Primary   Relevant Orders   Upper Respiratory Culture   Cough       Relevant Medications   benzonatate (TESSALON) 200 MG capsule      Meds ordered this encounter  Medications  . benzonatate (TESSALON) 200 MG capsule    Sig: Take 1 capsule (200 mg total) by mouth 2 (two) times daily as needed for cough.    Dispense:  20 capsule    Refill:  0    Order Specific Question:   Supervising Provider    Answer:   Smitty Cords [[id number]]   Consistent with acute pharyngitis, suspected viral etiology with constellation of viral symptoms and +sick contacts. Patient with significant history of strep throat, no recent antibiotics.  Patient reports she is a strep carrier as tested several years ago.  Currently  afebrile and no focal signs of infection (TM's clear, lungs clear). Acute strep throat unlikely.   Plan: 1. No rapid strep today. Reassurance, likely viral etiology of pharyngitis, should be self limited.  Send throat culture swab to lab. 2. Symptomatic control with NSAID / Tylenol regularly then PRN 3. Improve hydration, advance diet, warm herbal tea with honey 4. Consider trial mucinex DM 5. May start tessalon perles 200 mg bid for cough. 6. Follow-up if worsening within 1 week   Follow up plan: Return 6-7 days if symptoms worsen or fail to improve.  Wilhelmina Mcardle, DNP, AGPCNP-BC Adult Gerontology Primary Care Nurse Practitioner [facility] 06/06/2018, 3:37 PM

## 2018-06-06 NOTE — Patient Instructions (Addendum)
Stacey Huynh "Stacey Huynh",   Thank you for coming in to clinic today.  1. This is likely viral pharyngitis.  This will most likely run it's course in 7 to 10 days. Recommend good hand washing. - Continue  OTC Mucinex (or may try Mucinex-DM for cough) up to 7-10 days then stop - Drink plenty of fluids to improve congestion - You may try over the counter Nasal Saline spray (Simply Saline, Ocean Spray) as needed to reduce congestion. - Drink warm herbal tea with honey for sore throat. - Start taking Tylenol extra strength 1 to 2 tablets every 6-8 hours for aches or fever/chills for next few days as needed.  Do not take more than 3,000 mg in 24 hours from all medicines.  May take Ibuprofen as well if tolerated 200-400mg  every 8 hours as needed. - START tessalon perles 200 mg twice daily as needed for cough.  If symptoms significantly worsening with persistent fevers/chills despite tylenol/ibpurofen, nausea, vomiting unable to tolerate food/fluids or medicine, body aches, or shortness of breath, sinus pain pressure or worsening productive cough, then follow-up for re-evaluation, may seek more immediate care at Urgent Care or ED if more concerned for emergency.  Please schedule a follow-up appointment with Wilhelmina McardleLauren Samanthan Dugo, AGNP. Return 6-7 days if symptoms worsen or fail to improve.  If you have any other questions or concerns, please feel free to call the clinic or send a message through MyChart. You may also schedule an earlier appointment if necessary.  You will receive a survey after today's visit either digitally by e-mail or paper by [employer] mail. Your experiences and feedback matter to us.  Please respond so we know how we are doing as we provide care for you.   Wilhelmina McardleLauren Kimya Mccahill, DNP, AGNP-BC Adult Gerontology Nurse Practitioner [facility], [facility]

## 2018-06-06 NOTE — Progress Notes (Deleted)
Cough

## 2018-06-08 ENCOUNTER — Telehealth: Payer: Self-pay | Admitting: Nurse Practitioner

## 2018-06-08 DIAGNOSIS — R059 Cough, unspecified: Secondary | ICD-10-CM

## 2018-06-08 DIAGNOSIS — R05 Cough: Secondary | ICD-10-CM

## 2018-06-08 MED ORDER — ALBUTEROL SULFATE (2.5 MG/3ML) 0.083% IN NEBU: 2.5000 mg | INHALATION_SOLUTION | Freq: Four times a day (QID) | RESPIRATORY_TRACT | 1 refills | Status: DC | PRN

## 2018-06-08 NOTE — Telephone Encounter (Signed)
The pt was notified. No questions or concerns. 

## 2018-06-08 NOTE — Telephone Encounter (Signed)
Pt.called said that medication that was given was not working.She said her chest was hurting from coughing so much. Pt call back  # is [phone]

## 2018-06-08 NOTE — Telephone Encounter (Signed)
I have sent albuterol for chest tightness from coughing.  This will help treat any progression to bronchitis.

## 2018-06-09 ENCOUNTER — Other Ambulatory Visit: Payer: Self-pay

## 2018-06-09 DIAGNOSIS — I10 Essential (primary) hypertension: Secondary | ICD-10-CM

## 2018-06-09 LAB — CULTURE, UPPER RESPIRATORY
MICRO NUMBER:: 91016651
SPECIMEN QUALITY:: ADEQUATE

## 2018-06-09 MED ORDER — LISINOPRIL 10 MG PO TABS: 10.0000 mg | ORAL_TABLET | Freq: Every day | ORAL | 1 refills | Status: DC

## 2018-06-14 ENCOUNTER — Other Ambulatory Visit: Payer: Self-pay

## 2018-06-14 ENCOUNTER — Telehealth: Payer: Self-pay | Admitting: Nurse Practitioner

## 2018-06-14 ENCOUNTER — Encounter: Payer: Self-pay | Admitting: Nurse Practitioner

## 2018-06-14 ENCOUNTER — Telehealth: Payer: Self-pay

## 2018-06-14 DIAGNOSIS — J019 Acute sinusitis, unspecified: Principal | ICD-10-CM

## 2018-06-14 DIAGNOSIS — B9689 Other specified bacterial agents as the cause of diseases classified elsewhere: Secondary | ICD-10-CM

## 2018-06-14 MED ORDER — DOXYCYCLINE HYCLATE 100 MG PO TABS: 100.0000 mg | ORAL_TABLET | Freq: Two times a day (BID) | ORAL | 0 refills | Status: DC

## 2018-06-14 MED ORDER — NORGESTIMATE-ETH ESTRADIOL 0.25-35 MG-MCG PO TABS: 1.0000 | ORAL_TABLET | Freq: Every day | ORAL | 0 refills | Status: DC

## 2018-06-14 NOTE — Telephone Encounter (Signed)
Pt. Called said she was not better had clear stuff coming out of nose. Pt cal back # is  [phone]

## 2018-06-14 NOTE — Telephone Encounter (Signed)
Annual is scheduled for 9/23. Refill sent in

## 2018-06-14 NOTE — Telephone Encounter (Signed)
Completed in another encounter.

## 2018-06-14 NOTE — Telephone Encounter (Signed)
Called patient.  Also still having significant cough and "rattle in chest when lying flat."

## 2018-06-14 NOTE — Telephone Encounter (Signed)
Pt calling requesting refill on [facility], looks like over due for annual. fwding to AMS nurse.

## 2018-06-15 ENCOUNTER — Other Ambulatory Visit: Payer: Self-pay

## 2018-06-15 MED ORDER — NORGESTIMATE-ETH ESTRADIOL 0.25-35 MG-MCG PO TABS: 1.0000 | ORAL_TABLET | Freq: Every day | ORAL | 0 refills | Status: DC

## 2018-06-15 NOTE — Telephone Encounter (Signed)
Rx sent to [facility]

## 2018-06-27 ENCOUNTER — Encounter: Payer: Self-pay | Admitting: Nurse Practitioner

## 2018-06-29 ENCOUNTER — Encounter: Payer: Self-pay | Admitting: Nurse Practitioner

## 2018-06-30 ENCOUNTER — Ambulatory Visit
Admission: RE | Admit: 2018-06-30 | Discharge: 2018-06-30 | Disposition: A | Discharge status: Home / Self Care | Payer: BLUE CROSS/BLUE SHIELD | Source: Ambulatory Visit | Attending: Nurse Practitioner | Admitting: Nurse Practitioner

## 2018-06-30 ENCOUNTER — Encounter: Payer: Self-pay | Admitting: Nurse Practitioner

## 2018-06-30 ENCOUNTER — Ambulatory Visit (INDEPENDENT_AMBULATORY_CARE_PROVIDER_SITE_OTHER): Payer: BLUE CROSS/BLUE SHIELD | Admitting: Nurse Practitioner

## 2018-06-30 ENCOUNTER — Other Ambulatory Visit: Payer: Self-pay

## 2018-06-30 VITALS — BP 119/57 | HR 66 | Temp 98.8°F | Resp 16 | Ht 66.0 in | Wt 203.0 lb

## 2018-06-30 DIAGNOSIS — R05 Cough: Secondary | ICD-10-CM

## 2018-06-30 DIAGNOSIS — R052 Subacute cough: Secondary | ICD-10-CM

## 2018-06-30 MED ORDER — CEFTRIAXONE SODIUM 1 G IJ SOLR
1.0000 g | Freq: Once | INTRAMUSCULAR | Status: AC
  Administered 2018-06-30: 1 g via INTRAMUSCULAR

## 2018-06-30 MED ORDER — AZITHROMYCIN 250 MG PO TABS: ORAL_TABLET | ORAL | 0 refills | Status: DC

## 2018-06-30 NOTE — Patient Instructions (Addendum)
Stacey Huynh "[name]",   Thank you for coming in to clinic today.  1. No confirmed pneumonia on xray by my reading.  However, given clinical presentation with worsening after stopping antibiotics, we will still treat for pneumonia.  START azithromycin 250 mg tablet. Take 2 tablets today.  Then, take 1 tablet daily for the next 4 days and stop.   Please schedule a follow-up appointment with Wilhelmina McardleLauren Cledith Kamiya, AGNP. Return 5-7 days if symptoms worsen or fail to improve.  If you have any other questions or concerns, please feel free to call the clinic or send a message through MyChart. You may also schedule an earlier appointment if necessary.  You will receive a survey after today's visit either digitally by e-mail or paper by [employer] mail. Your experiences and feedback matter to us.  Please respond so we know how we are doing as we provide care for you.   Wilhelmina McardleLauren Nami Strawder, DNP, AGNP-BC Adult Gerontology Nurse Practitioner [facility], [facility]

## 2018-06-30 NOTE — Progress Notes (Signed)
   Subjective:    Patient ID: Stacey Huynh, female    DOB: 10/20/1985, 32 y.o.   MRN: [id number]  Stacey Huynh is a 32 y.o. female presenting on 06/30/2018 for Cough (deep productive cough w/ nasal drainage x 1 mth. Pt was treated w/ abx, she notice some improvement, but symptoms return after finishing abx. )   HPI Cough To expand on details in CC above, patient is having some chest discomfort with coughing, no other chest pain, discomfort.   Pt denies fever, chills, sweats, nausea, vomiting, diarrhea and constipation.    Social History   Tobacco Use  . Smoking status: Never Smoker  . Smokeless tobacco: Never Used  Substance Use Topics  . Alcohol use: No  . Drug use: No    Review of Systems Per HPI unless specifically indicated above     Objective:    BP (!) 119/57 (BP Location: Right Arm, Patient Position: Sitting, Cuff Size: Normal)   Pulse 66   Temp 98.8 F (37.1 C) (Oral)   Resp 16   Ht 5\' 6"  (1.676 m)   Wt 203 lb (92.1 kg)   SpO2 100%   BMI 32.77 kg/m   Wt Readings from Last 3 Encounters:  06/30/18 203 lb (92.1 kg)  06/06/18 202 lb 8 oz (91.9 kg)  04/21/18 205 lb 9.6 oz (93.3 kg)    Physical Exam   Results for orders placed or performed in visit on 06/06/18  Upper Respiratory Culture  Result Value Ref Range   MICRO NUMBER: [id number]    SPECIMEN QUALITY: ADEQUATE    Source NOT GIVEN    STATUS: FINAL    Result: No oropharyngeal pathogens recovered.       Assessment & Plan:   Problem List Items Addressed This Visit    None    Presumed CAP in otherwise healthy adult. May have evolved from prolonged viral URI / bronchitis. Not recent hospitalization or IV antibiotics within past 90 days. Unlikely PE. - Afebrile, tachycardic, no hypoxia   Plan: 1. Start empiric antibiotics with  Ceftriaxone IM 1g x 1 dose in office, Azithromycin PO 500mg  (day 1) then 250mg  daily x 4 days, Doxycycline 100mg  BID x 10 days 2. Recommend improve hydration and add  Mucinex-DM twice daily for up to 5-7 days if sputum still thick 3. Tessalon Perls for cough TID PRN 4. Tylenol, Ibuprofen PRN fevers 5. RTC within 1 week if not improving, otherwise strict return criteria to go to ED  Meds ordered this encounter  Medications  . cefTRIAXone (ROCEPHIN) injection 1 g  . azithromycin (ZITHROMAX) 250 MG tablet    Sig: Take one tablet twice today.  Then, take 1 tablet daily for 4 days.    Dispense:  6 tablet    Refill:  0    Order Specific Question:   Supervising Provider    Answer:   Smitty CordsKARAMALEGOS, ALEXANDER J [[id number]]    Follow up plan: Return 5-7 days if symptoms worsen or fail to improve.  Stacey McardleLauren Mattilynn Forrer, DNP, AGPCNP-BC Adult Gerontology Primary Care Nurse Practitioner [facility] 06/30/2018, 1:27 PM

## 2018-07-04 ENCOUNTER — Ambulatory Visit: Payer: Self-pay | Admitting: Obstetrics and Gynecology

## 2018-07-08 ENCOUNTER — Ambulatory Visit (INDEPENDENT_AMBULATORY_CARE_PROVIDER_SITE_OTHER): Payer: BLUE CROSS/BLUE SHIELD | Admitting: Obstetrics and Gynecology

## 2018-07-08 ENCOUNTER — Encounter: Payer: Self-pay | Admitting: Obstetrics and Gynecology

## 2018-07-08 VITALS — BP 128/68 | HR 66 | Wt 226.0 lb

## 2018-07-08 DIAGNOSIS — Z1239 Encounter for other screening for malignant neoplasm of breast: Secondary | ICD-10-CM

## 2018-07-08 DIAGNOSIS — Z124 Encounter for screening for malignant neoplasm of cervix: Secondary | ICD-10-CM | POA: Diagnosis not present

## 2018-07-08 DIAGNOSIS — Z1231 Encounter for screening mammogram for malignant neoplasm of breast: Secondary | ICD-10-CM

## 2018-07-08 DIAGNOSIS — Z01419 Encounter for gynecological examination (general) (routine) without abnormal findings: Secondary | ICD-10-CM | POA: Diagnosis not present

## 2018-07-08 MED ORDER — NORETHIN-ETH ESTRAD-FE BIPHAS 1 MG-10 MCG / 10 MCG PO TABS: 1.0000 | ORAL_TABLET | Freq: Every day | ORAL | 3 refills | Status: DC

## 2018-07-08 NOTE — Patient Instructions (Signed)
Preventive Care 18-39 Years, Female Preventive care refers to lifestyle choices and visits with your health care provider that can promote health and wellness. What does preventive care include?  A yearly physical exam. This is also called an annual well check.  Dental exams once or twice a year.  Routine eye exams. Ask your health care provider how often you should have your eyes checked.  Personal lifestyle choices, including: ? Daily care of your teeth and gums. ? Regular physical activity. ? Eating a healthy diet. ? Avoiding tobacco and drug use. ? Limiting alcohol use. ? Practicing safe sex. ? Taking vitamin and mineral supplements as recommended by your health care provider. What happens during an annual well check? The services and screenings done by your health care provider during your annual well check will depend on your age, overall health, lifestyle risk factors, and family history of disease. Counseling Your health care provider may ask you questions about your:  Alcohol use.  Tobacco use.  Drug use.  Emotional well-being.  Home and relationship well-being.  Sexual activity.  Eating habits.  Work and work Statistician.  Method of birth control.  Menstrual cycle.  Pregnancy history.  Screening You may have the following tests or measurements:  Height, weight, and BMI.  Diabetes screening. This is done by checking your blood sugar (glucose) after you have not eaten for a while (fasting).  Blood pressure.  Lipid and cholesterol levels. These may be checked every 5 years starting at age 66.  Skin check.  Hepatitis C blood test.  Hepatitis B blood test.  Sexually transmitted disease (STD) testing.  BRCA-related cancer screening. This may be done if you have a family history of breast, ovarian, tubal, or peritoneal cancers.  Pelvic exam and Pap test. This may be done every 3 years starting at age 40. Starting at age 59, this may be done every 5  years if you have a Pap test in combination with an HPV test.  Discuss your test results, treatment options, and if necessary, the need for more tests with your health care provider. Vaccines Your health care provider may recommend certain vaccines, such as:  Influenza vaccine. This is recommended every year.  Tetanus, diphtheria, and acellular pertussis (Tdap, Td) vaccine. You may need a Td booster every 10 years.  Varicella vaccine. You may need this if you have not been vaccinated.  HPV vaccine. If you are 69 or younger, you may need three doses over 6 months.  Measles, mumps, and rubella (MMR) vaccine. You may need at least one dose of MMR. You may also need a second dose.  Pneumococcal 13-valent conjugate (PCV13) vaccine. You may need this if you have certain conditions and were not previously vaccinated.  Pneumococcal polysaccharide (PPSV23) vaccine. You may need one or two doses if you smoke cigarettes or if you have certain conditions.  Meningococcal vaccine. One dose is recommended if you are age 27-21 years and a first-year college student living in a residence hall, or if you have one of several medical conditions. You may also need additional booster doses.  Hepatitis A vaccine. You may need this if you have certain conditions or if you travel or work in places where you may be exposed to hepatitis A.  Hepatitis B vaccine. You may need this if you have certain conditions or if you travel or work in places where you may be exposed to hepatitis B.  Haemophilus influenzae type b (Hib) vaccine. You may need this if  you have certain risk factors.  Talk to your health care provider about which screenings and vaccines you need and how often you need them. This information is not intended to replace advice given to you by your health care provider. Make sure you discuss any questions you have with your health care provider. Document Released: 11/24/2001 Document Revised: 06/17/2016  Document Reviewed: 07/30/2015 Elsevier Interactive Patient Education  [employer].

## 2018-07-08 NOTE — Progress Notes (Signed)
Gynecology Annual Exam   PCP: Galen Manila, NP  Chief Complaint:  Chief Complaint  Patient presents with  . Gynecologic Exam    History of Present Illness: Patient is a 32 y.o. G1P1001 presents for annual exam. The patient has no complaints today.   LMP: Patient's last menstrual period was 06/17/2018. Average Interval: regular, 28 days Duration of flow: 5 days Heavy Menses: no Clots: no Intermenstrual Bleeding: no Postcoital Bleeding: no Dysmenorrhea: no  The patient is sexually active. She currently uses OCP (estrogen/progesterone) for contraception. She denies dyspareunia.  The patient does perform self breast exams.  There is no notable family history of breast or ovarian cancer in her family.  The patient wears seatbelts: yes.   The patient has regular exercise: not asked.    The patient denies current symptoms of depression.    Review of Systems: Review of Systems  Constitutional: Negative for chills and fever.  HENT: Negative for congestion.   Respiratory: Negative for cough and shortness of breath.   Cardiovascular: Negative for chest pain and palpitations.  Gastrointestinal: Negative for abdominal pain, constipation, diarrhea, heartburn, nausea and vomiting.  Genitourinary: Negative for dysuria, frequency and urgency.  Skin: Negative for itching and rash.  Neurological: Negative for dizziness and headaches.  Endo/Heme/Allergies: Negative for polydipsia.  Psychiatric/Behavioral: Negative for depression.    Past Medical History:  Past Medical History:  Diagnosis Date  . Abnormal Pap smear   . Abnormal Pap smear of cervix   . Anxiety     Past Surgical History:  Past Surgical History:  Procedure Laterality Date  . LEEP    . WISDOM TOOTH EXTRACTION      Gynecologic History:  Patient's last menstrual period was 06/17/2018. Contraception: OCP (estrogen/progesterone) Last Pap: Results were: 05/12/17 NIL and HR HPV negative   Obstetric History:  G1P1001  Family History:  Family History  Problem Relation Age of Onset  . Pancreatic cancer Paternal Aunt   . Bone cancer Maternal Grandmother   . Lung cancer Maternal Grandmother   . Colon cancer Paternal Grandfather   . Depression Mother   . Depression Father   . Suicidality Father     Social History:  Social History   Socioeconomic History  . Marital status: Married    Spouse name: Not on file  . Number of children: Not on file  . Years of education: Not on file  . Highest education level: Not on file  Occupational History  . Not on file  Social Needs  . Financial resource strain: Not on file  . Food insecurity:    Worry: Not on file    Inability: Not on file  . Transportation needs:    Medical: Not on file    Non-medical: Not on file  Tobacco Use  . Smoking status: Never Smoker  . Smokeless tobacco: Never Used  Substance and Sexual Activity  . Alcohol use: No  . Drug use: No  . Sexual activity: Yes    Birth control/protection: Pill  Lifestyle  . Physical activity:    Days per week: Not on file    Minutes per session: Not on file  . Stress: Not on file  Relationships  . Social connections:    Talks on phone: Not on file    Gets together: Not on file    Attends religious service: Not on file    Active member of club or organization: Not on file    Attends meetings of clubs or  organizations: Not on file    Relationship status: Not on file  . Intimate partner violence:    Fear of current or ex partner: Not on file    Emotionally abused: Not on file    Physically abused: Not on file    Forced sexual activity: Not on file  Other Topics Concern  . Not on file  Social History Narrative  . Not on file    Allergies:  Allergies  Allergen Reactions  . Penicillins Other (See Comments)    Unknown, childhood reaction    Medications: Prior to Admission medications   Medication Sig Start Date End Date Taking? Authorizing Provider  albuterol (PROVENTIL)  (2.5 MG/3ML) 0.083% nebulizer solution Take 3 mLs (2.5 mg total) by nebulization every 6 (six) hours as needed for wheezing or shortness of breath (coughing fits). 06/08/18  Yes Galen Manila, NP  lisinopril (PRINIVIL,ZESTRIL) 10 MG tablet Take 1 tablet (10 mg total) by mouth daily. 06/09/18  Yes Galen Manila, NP  norgestimate-ethinyl estradiol (ORTHO-CYCLEN,SPRINTEC,PREVIFEM) 0.25-35 MG-MCG tablet Take 1 tablet by mouth daily. 06/15/18  Yes Vena Austria, MD    Physical Exam Vitals: Blood pressure 128/68, pulse 66, weight 226 lb (102.5 kg), last menstrual period 06/17/2018.  General: NAD HEENT: normocephalic, anicteric Thyroid: no enlargement, no palpable nodules Pulmonary: No increased work of breathing, CTAB Cardiovascular: RRR, distal pulses 2+ Breast: Breast symmetrical, no tenderness, no palpable nodules or masses, no skin or nipple retraction present, no nipple discharge.  No axillary or supraclavicular lymphadenopathy. Abdomen: NABS, soft, non-tender, non-distended.  Umbilicus without lesions.  No hepatomegaly, splenomegaly or masses palpable. No evidence of hernia  Genitourinary:  External: Normal external female genitalia.  Normal urethral meatus, normal Bartholin's and Skene's glands.    Vagina: Normal vaginal mucosa, no evidence of prolapse.    Cervix: Grossly normal in appearance, no bleeding  Uterus: Non-enlarged, mobile, normal contour.  No CMT  Adnexa: ovaries non-enlarged, no adnexal masses  Rectal: deferred  Lymphatic: no evidence of inguinal lymphadenopathy Extremities: no edema, erythema, or tenderness Neurologic: Grossly intact Psychiatric: mood appropriate, affect full  Female chaperone present for pelvic and breast  portions of the physical exam    Assessment: 32 y.o. G1P1001 routine annual exam  Plan: Problem List Items Addressed This Visit    None    Visit Diagnoses    Encounter for gynecological examination without abnormal finding    -   Primary   Breast screening       Screening for malignant neoplasm of cervix          2) STI screening  wasoffered and declined  2)  ASCCP guidelines and rational discussed.  Patient opts for every 3 years screening interval  3) Contraception - the patient is currently using  OCP (estrogen/progesterone).  She is happy with her current form of contraception and plans to continue - discussed WHO CDC recommendation for avoidance of estrogen would like to continue.  Will decrease estrogen dose with LoLoestrin   4) Routine healthcare maintenance including cholesterol, diabetes screening discussed managed by PCP  5) Return in about 1 year (around 07/09/2019) for Annual.   Vena Austria, MD, [facility], [facility] 07/08/2018, 2:35 PM

## 2018-07-10 ENCOUNTER — Other Ambulatory Visit: Payer: Self-pay | Admitting: Obstetrics and Gynecology

## 2018-07-30 ENCOUNTER — Encounter: Payer: Self-pay | Admitting: Nurse Practitioner

## 2018-09-02 ENCOUNTER — Encounter: Payer: Self-pay | Admitting: Nurse Practitioner

## 2018-10-24 ENCOUNTER — Other Ambulatory Visit: Payer: Self-pay

## 2018-10-24 ENCOUNTER — Encounter: Payer: Self-pay | Admitting: Nurse Practitioner

## 2018-10-24 ENCOUNTER — Ambulatory Visit (INDEPENDENT_AMBULATORY_CARE_PROVIDER_SITE_OTHER): Payer: BLUE CROSS/BLUE SHIELD | Admitting: Nurse Practitioner

## 2018-10-24 VITALS — BP 118/63 | HR 79 | Temp 98.8°F | Ht 66.0 in | Wt 204.2 lb

## 2018-10-24 DIAGNOSIS — G4700 Insomnia, unspecified: Secondary | ICD-10-CM | POA: Diagnosis not present

## 2018-10-24 DIAGNOSIS — Z23 Encounter for immunization: Secondary | ICD-10-CM | POA: Diagnosis not present

## 2018-10-24 DIAGNOSIS — I10 Essential (primary) hypertension: Secondary | ICD-10-CM

## 2018-10-24 DIAGNOSIS — L821 Other seborrheic keratosis: Secondary | ICD-10-CM | POA: Diagnosis not present

## 2018-10-24 MED ORDER — LISINOPRIL 10 MG PO TABS: 10.0000 mg | ORAL_TABLET | Freq: Every day | ORAL | 3 refills | Status: DC

## 2018-10-24 NOTE — Patient Instructions (Addendum)
Stacey Huynh,   Thank you for coming in to clinic today.  1. Blood pressure is normal. - Continue lisinopril   2. May try Unisom (doxylamine) active ingredient only if desired. 3. May try melatonin 3-6 mg or extended release 5 mg.  Once about 30 mins -1 hour before bed. - Once you have taken melatonin for 2-3 weeks, then taper down and take only as needed.   Sleep Hygiene Tips  Take medicines only as directed by your health care provider.  Keep regular sleeping and waking hours. Avoid naps.  Keep a sleep diary to help you and your health care provider figure out what could be causing your insomnia. Include:  When you sleep.  When you wake up during the night.  How well you sleep.  How rested you feel the next day.  Any side effects of medicines you are taking.  What you eat and drink.  Make your bedroom a comfortable place where it is easy to fall asleep:  Put up shades or special blackout curtains to block light from outside.  Use a white noise machine to block noise.  Keep the temperature cool.  Exercise regularly as directed by your health care provider. Avoid exercising right before bedtime.  Use relaxation techniques to manage stress. Ask your health care provider to suggest some techniques that may work well for you. These may include:  Breathing exercises.  Routines to release muscle tension.  Visualizing peaceful scenes.  Cut back on alcohol, caffeinated beverages, and cigarettes, especially close to bedtime. These can disrupt your sleep.  Do not overeat or eat spicy foods right before bedtime. This can lead to digestive discomfort that can make it hard for you to sleep.  Limit screen use before bedtime. This includes:  Watching TV.  Using your smartphone, tablet, and computer.  Stick to a routine. This can help you fall asleep faster. Try to do a quiet activity, brush your teeth, and go to bed at the same time each night.  Get out  of bed if you are still awake after 15 minutes of trying to sleep. Keep the lights down, but try reading or doing a quiet activity. When you feel sleepy, go back to bed.  Make sure that you drive carefully. Avoid driving if you feel very sleepy.  Keep all follow-up appointments as directed by your health care provider. This is important.  Please schedule a follow-up appointment with Wilhelmina Mcardle, AGNP. Return in about 1 year (around 10/25/2019) for hypertension AND as needed for insomnia in 6 weeks if no improvements.  If you have any other questions or concerns, please feel free to call the clinic or send a message through MyChart. You may also schedule an earlier appointment if necessary.  You will receive a survey after today's visit either digitally by e-mail or paper by [employer]. Your experiences and feedback matter to [place].  Please respond so we know how we are doing as we provide care for you.   Wilhelmina Mcardle, DNP, AGNP-BC Adult Gerontology Nurse Practitioner [facility], [facility]

## 2018-10-24 NOTE — Progress Notes (Signed)
Subjective:    Patient ID: Stacey Huynh, female    DOB: [birth year], 33 y.o.   MRN: [id number]  ZULI MEALOR is a 33 y.o. female presenting on 10/24/2018 for Hypertension   HPI Hypertension - She is not checking BP at home or outside of clinic.   - Current medications: lisinopril 10 mg once daily, tolerating well without side effects - She is not currently symptomatic. - Pt denies headache, lightheadedness, dizziness, changes in vision, chest tightness/pressure, palpitations, leg swelling, sudden loss of speech or loss of consciousness. - She  reports no regular exercise routine outside of work, but stays active with walking at work. - Her diet is moderate in salt, moderate in fat, and moderate in carbohydrates.   Insomnia Goes to bed around 9-10 pm.  Wakes around 5:30  For the day.  Is usually awake 11p-1a regularly.  Lies in bed to return to sleep - tossing and turning, some racing thoughts. Has had anxiety in past.  - Has tried ibuprofen/Tylenol pm - Has not tried melatonin  Social History   Tobacco Use  . Smoking status: Never Smoker  . Smokeless tobacco: Never Used  Substance Use Topics  . Alcohol use: No  . Drug use: No    Review of Systems Per HPI unless specifically indicated above     Objective:    BP 118/63 (BP Location: Right Arm, Patient Position: Sitting, Cuff Size: Large)   Pulse 79   Temp 98.8 F (37.1 C) (Oral)   Ht 5\' 6"  (1.676 m)   Wt 204 lb 3.2 oz (92.6 kg)   LMP 10/03/2018   BMI 32.96 kg/m   Wt Readings from Last 3 Encounters:  10/24/18 204 lb 3.2 oz (92.6 kg)  07/08/18 226 lb (102.5 kg)  06/30/18 203 lb (92.1 kg)    Physical Exam Vitals signs reviewed.  Constitutional:      General: She is not in acute distress.    Appearance: She is well-developed.  HENT:     Head: Normocephalic and atraumatic.  Cardiovascular:     Rate and Rhythm: Normal rate and regular rhythm.     Pulses:          Radial pulses are 2+ on the right side  and 2+ on the left side.       Posterior tibial pulses are 1+ on the right side and 1+ on the left side.     Heart sounds: Normal heart sounds, S1 normal and S2 normal.  Pulmonary:     Effort: Pulmonary effort is normal. No respiratory distress.     Breath sounds: Normal breath sounds and air entry.  Musculoskeletal:     Right lower leg: No edema.     Left lower leg: No edema.  Skin:    General: Skin is warm and dry.     Capillary Refill: Capillary refill takes less than 2 seconds.  Neurological:     Mental Status: She is alert and oriented to person, place, and time.  Psychiatric:        Attention and Perception: Attention normal.        Mood and Affect: Mood and affect normal.        Behavior: Behavior normal. Behavior is cooperative.        Thought Content: Thought content normal.        Judgment: Judgment normal.       Assessment & Plan:   Problem List Items Addressed This Visit  Cardiovascular and Mediastinum   Essential hypertension - Primary Controlled hypertension.  BP goal < 130/80.  Pt is working on lifestyle modifications.  Taking medications tolerating well without side effects. No current complications.  Plan: 1. Continue taking lisinopril 10 mg once daily 2. Obtain labs for kidney function today  3. Encouraged heart healthy diet and increasing exercise to 30 minutes most days of the week. 4. Check BP 1-2 x per week at home, keep log, and bring to clinic at next appointment. 5. Follow up 12 months.     Relevant Medications   lisinopril (PRINIVIL,ZESTRIL) 10 MG tablet   Other Relevant Orders   BASIC METABOLIC PANEL WITH GFR    Other Visit Diagnoses    Needs flu shot       Relevant Orders   Flu Vaccine QUAD 6+ mos PF IM (Fluarix Quad PF) (Completed)   Insomnia, unspecified type     Patient with chronic insomnia and inability with sleep maintenance.  Has several opportunities to improve sleep hygiene and try OTC meds.  Plan: 1. May try Unisom or  melatonin - see AVS 2. Sleep hygiene tips provided. 3. Follow-up 6 weeks prn if needing to discuss pharmacologic options for treatment.  Could consider trazodone.      Meds ordered this encounter  Medications  . lisinopril (PRINIVIL,ZESTRIL) 10 MG tablet    Sig: Take 1 tablet (10 mg total) by mouth daily.    Dispense:  90 tablet    Refill:  3    Order Specific Question:   Supervising Provider    Answer:   Smitty CordsKARAMALEGOS, ALEXANDER J [[id number]]    Follow up plan: Return in about 1 year (around 10/25/2019) for hypertension AND as needed for insomnia in 6 weeks if no improvements.  Wilhelmina McardleLauren Jessia Kief, DNP, AGPCNP-BC Adult Gerontology Primary Care Nurse Practitioner [facility] 10/24/2018, 3:11 PM

## 2018-10-27 ENCOUNTER — Encounter: Payer: Self-pay | Admitting: Nurse Practitioner

## 2018-10-31 DIAGNOSIS — I10 Essential (primary) hypertension: Secondary | ICD-10-CM | POA: Diagnosis not present

## 2018-10-31 LAB — BASIC METABOLIC PANEL WITH GFR
BUN: 12 mg/dL (ref 7–25)
CO2: 24 mmol/L (ref 20–32)
Calcium: 9.3 mg/dL (ref 8.6–10.2)
Chloride: 106 mmol/L (ref 98–110)
Creat: 0.83 mg/dL (ref 0.50–1.10)
GFR, Est African American: 107 mL/min/{1.73_m2} (ref >=60)
GFR, Est Non African American: 93 mL/min/{1.73_m2} (ref >=60)
Glucose, Bld: 100 mg/dL — ABNORMAL HIGH (ref 65–99)
Potassium: 4.8 mmol/L (ref 3.5–5.3)
Sodium: 139 mmol/L (ref 135–146)

## 2018-11-07 ENCOUNTER — Other Ambulatory Visit: Payer: Self-pay | Admitting: Nurse Practitioner

## 2018-11-07 DIAGNOSIS — I10 Essential (primary) hypertension: Secondary | ICD-10-CM

## 2018-12-08 ENCOUNTER — Encounter: Payer: Self-pay | Admitting: Nurse Practitioner

## 2018-12-08 ENCOUNTER — Ambulatory Visit (INDEPENDENT_AMBULATORY_CARE_PROVIDER_SITE_OTHER): Payer: Self-pay | Admitting: Nurse Practitioner

## 2018-12-08 VITALS — BP 112/63 | HR 85 | Temp 99.0°F | Resp 16 | Ht 66.0 in | Wt 205.8 lb

## 2018-12-08 DIAGNOSIS — R509 Fever, unspecified: Secondary | ICD-10-CM

## 2018-12-08 DIAGNOSIS — Z20828 Contact with and (suspected) exposure to other viral communicable diseases: Secondary | ICD-10-CM

## 2018-12-08 LAB — POCT INFLUENZA A/B
Influenza A, POC: NEGATIVE
Influenza B, POC: NEGATIVE

## 2018-12-08 MED ORDER — OSELTAMIVIR PHOSPHATE 75 MG PO CAPS: 75.0000 mg | ORAL_CAPSULE | Freq: Every day | ORAL | 0 refills | Status: AC

## 2018-12-08 NOTE — Progress Notes (Signed)
Subjective:    Patient ID: Stacey Huynh, female    DOB: 06/22/1986, 33 y.o.   MRN: [id number]  Stacey Huynh is a 33 y.o. female presenting on 12/08/2018 for Influenza (running fever, dry cough and feeling tired 1 day ago, son was dx with flu on Tuesday)   HPI URI symptoms Has had fever, dry cough, malaise/fatigue started yesterday (1 day ago).  Seeking care today for possible flu due to positive flu exposure with her son on Tuesday.   - no other uri symptoms today. - No problems with breathing. - Has taken Tylenol/ibuprofen for fever / aches.  Social History   Tobacco Use  . Smoking status: Never Smoker  . Smokeless tobacco: Never Used  Substance Use Topics  . Alcohol use: No  . Drug use: No    Review of Systems Per HPI unless specifically indicated above     Objective:    BP 112/63   Pulse 85   Temp 99 F (37.2 C) (Oral)   Resp 16   Ht 5\' 6"  (1.676 m)   Wt 205 lb 12.8 oz (93.4 kg)   SpO2 99%   BMI 33.22 kg/m   Wt Readings from Last 3 Encounters:  12/08/18 205 lb 12.8 oz (93.4 kg)  10/24/18 204 lb 3.2 oz (92.6 kg)  07/08/18 226 lb (102.5 kg)    Physical Exam Vitals signs reviewed.  Constitutional:      Appearance: She is well-developed. She is ill-appearing.  HENT:     Head: Normocephalic and atraumatic.     Right Ear: Hearing, tympanic membrane, ear canal and external ear normal.     Left Ear: Hearing, tympanic membrane, ear canal and external ear normal.     Nose: Mucosal edema and rhinorrhea present.     Right Sinus: No maxillary sinus tenderness or frontal sinus tenderness.     Left Sinus: No maxillary sinus tenderness or frontal sinus tenderness.     Mouth/Throat:     Lips: Pink.     Mouth: Mucous membranes are moist.     Pharynx: Uvula midline. No pharyngeal swelling, oropharyngeal exudate (clear secretions) or uvula swelling.     Tonsils: No tonsillar exudate. Swelling: 1+ on the right. 1+ on the left.  Eyes:     General: Lids are  normal.        Right eye: No discharge.        Left eye: No discharge.     Conjunctiva/sclera: Conjunctivae normal.     Pupils: Pupils are equal, round, and reactive to light.  Neck:     Musculoskeletal: Full passive range of motion without pain, normal range of motion and neck supple.  Cardiovascular:     Rate and Rhythm: Normal rate and regular rhythm.     Pulses: Normal pulses.     Heart sounds: Normal heart sounds, S1 normal and S2 normal.  Pulmonary:     Effort: Pulmonary effort is normal. No respiratory distress.     Breath sounds: Normal breath sounds.  Musculoskeletal:     Right lower leg: No edema.     Left lower leg: No edema.  Lymphadenopathy:     Cervical: Cervical adenopathy present.     Right cervical: Superficial cervical adenopathy present.     Left cervical: Superficial cervical adenopathy present.  Skin:    General: Skin is warm and dry.     Capillary Refill: Capillary refill takes less than 2 seconds.  Neurological:     General:  No focal deficit present.     Mental Status: She is alert.     GCS: GCS eye subscore is 4. GCS verbal subscore is 5. GCS motor subscore is 6.  Psychiatric:        Attention and Perception: Attention normal.        Mood and Affect: Mood normal.        Behavior: Behavior normal. Behavior is cooperative.        Thought Content: Thought content normal.        Judgment: Judgment normal.      Results for orders placed or performed in visit on 12/08/18  POCT Influenza A/B  Result Value Ref Range   Influenza A, POC Negative Negative   Influenza B, POC Negative Negative      Assessment & Plan:   Problem List Items Addressed This Visit    None    Visit Diagnoses    Fever, unspecified fever cause    -  Primary   Relevant Orders   POCT Influenza A/B (Completed)   Exposure to the flu       Relevant Medications   oseltamivir (TAMIFLU) 75 MG capsule    Clinically diagnosed influenza despite negative rapid flu test today, concern  for flu still due to significant known exposure to positive influenza  - Duration x 1 days, without complication. Tolerating PO and well hydrated - No other focal findings of infection today - S/p influenza vaccine this season  Plan: 1. Start Tamiflu 75mg  capsules BID x 5 days,  2. Supportive care as advised with NSAID / Tylenol PRN fever/myalgias, improve hydration, may take OTC Cold/Flu meds 3. Return criteria given if significant worsening, consider post-influenza complications, otherwise follow-up if needed     Meds ordered this encounter  Medications  . oseltamivir (TAMIFLU) 75 MG capsule    Sig: Take 1 capsule (75 mg total) by mouth daily for 10 days. If you develop symptoms of flu, take twice daily for remainder of prescription.    Dispense:  10 capsule    Refill:  0    Order Specific Question:   Supervising Provider    Answer:   Smitty Cords [[id number]]    Follow up plan: Return if symptoms worsen or fail to improve.  Stacey Mcardle, DNP, AGPCNP-BC Adult Gerontology Primary Care Nurse Practitioner [facility] 12/08/2018, 11:43 AM

## 2018-12-08 NOTE — Patient Instructions (Addendum)
Stacey Huynh,   Thank you for coming in to clinic today.  1. Your flu test was NEGATIVE.  It is possible you can still have the flu with a negative test, otherwise it could be a different virus - Start Tamiflu (anti-flu medicine) take one capsule 75mg  daily for 10 days for prevention.  OR take twice a day for 5 days if you start feeling sick. - Wash hands and cover cough very well to avoid spread of infection - For symptom control:      - Take Ibuprofen / Advil 400-600mg  every 6-8 hours as needed for fever / muscle aches.  You may also take Tylenol 500-1000mg  per dose every 6-8 hours or 3 times a day.  You can alternate dosing and take both in the same day.      - Do not take more than 3,000 mg acetaminophen (Tylenol) in a day.      - Start OTC Mucinex-DM for cough and congestion for up to 7 days. - Improve hydration with plenty of clear fluids.  Drink up to 8 glasses of water / fluids each day.  If significant worsening with poor fluid intake, worsening fever, difficulty breathing due to coughing, worsening body aches, weakness, or other more concerning symptoms difficulty breathing you can seek treatment at Emergency Department. If your flu symptoms have improved and then get worse several days to a week later with concerns for bronchitis, productive cough, fever, and chills we may need to check for possible pneumonia that can occur after the flu.  Please schedule a follow-up appointment with Wilhelmina Mcardle, AGNP. Return if symptoms worsen or fail to improve.  If you have any other questions or concerns, please feel free to call the clinic or send a message through MyChart. You may also schedule an earlier appointment if necessary.  You will receive a survey after today's visit either digitally by e-mail or paper by [employer]. Your experiences and feedback matter to [place].  Please respond so we know how we are doing as we provide care for you.   Wilhelmina Mcardle, DNP,  AGNP-BC Adult Gerontology Nurse Practitioner [facility], [facility]

## 2018-12-10 ENCOUNTER — Encounter: Payer: Self-pay | Admitting: Nurse Practitioner

## 2019-01-18 ENCOUNTER — Other Ambulatory Visit: Payer: Self-pay | Admitting: Obstetrics and Gynecology

## 2019-01-18 MED ORDER — NITROFURANTOIN MONOHYD MACRO 100 MG PO CAPS: 100.0000 mg | ORAL_CAPSULE | Freq: Two times a day (BID) | ORAL | 0 refills | Status: AC

## 2019-03-23 ENCOUNTER — Ambulatory Visit (INDEPENDENT_AMBULATORY_CARE_PROVIDER_SITE_OTHER): Payer: BC Managed Care – PPO | Admitting: Obstetrics and Gynecology

## 2019-03-23 ENCOUNTER — Other Ambulatory Visit: Payer: Self-pay

## 2019-03-23 ENCOUNTER — Encounter: Payer: Self-pay | Admitting: Obstetrics and Gynecology

## 2019-03-23 VITALS — BP 138/78 | HR 80 | Ht 66.0 in | Wt 213.0 lb

## 2019-03-23 DIAGNOSIS — N939 Abnormal uterine and vaginal bleeding, unspecified: Secondary | ICD-10-CM | POA: Diagnosis not present

## 2019-03-23 MED ORDER — SLYND 4 MG PO TABS: 1.0000 | ORAL_TABLET | Freq: Every day | ORAL | 1 refills | Status: DC

## 2019-03-23 NOTE — Progress Notes (Signed)
Gynecology Abnormal Uterine Bleeding Initial Evaluation   Chief Complaint:  Chief Complaint  Patient presents with  . Metrorrhagia    sporadic heavy bleeding w/clots  . fatigue with headaches    History of Present Illness:    Paitient is a 33 y.o. G1P1001 who LMP was No LMP recorded. (Menstrual status: Oral contraceptives)., presents today for a problem visit.  She complains of menometrorrhagia that  began 2 months ago and its severity is described as moderate.  The patient menstrual complaints are acuteThey are associated with mild menstrual cramping.  She has used the following for attempts at control: tampon and pad. The patient is sexually active. She currently uses OCP (estrogen/progesterone)for contraception.  Flow heavy enough that she will soak through an overnight pad at night.  Last Pap results esults were obtained 05/12/2017 NIL and HR HPV negative   Previous evaluation: none Prior Diagnosis: N/A. Previous Treatment: none.  LMP: No LMP recorded. (Menstrual status: Oral contraceptives).   Paramter Normal / Abnormal Prsent  Frequency Amenoorhea     Infrequent (>38 days)     Normal (?24 days ?38 days)     Freequent (<24 days) X  Duration Normal (?8 days)     Prolonged (>8 days) X  Regularity Regular (shortest to longest cycle variation ?7-9 days)*     Irregular (shortest to longest cycle variation ?8-10days)* X  Flow Volume Light    (Self reported) Normal     Heavy X      Intermenstrual Bleeding None     Random     Cyclical early     Cyclical mid     Cyclical late        Unscheduled Bleeding  Not applicable    (exogenous hormones) Absent     Present X   FIGO AUB I System: *The available evidence suggests that, using these criteria, the normal range (shortest to longest) varies with age: 76-25 y of age, ?48 d; 26-41 y, ?7 d; and for 12-45 y, ?9 d    Review of Systems: ROS  Past Medical History:  Past Medical History:  Diagnosis Date  . Abnormal Pap smear    . Abnormal Pap smear of cervix   . Anxiety     Past Surgical History:  Past Surgical History:  Procedure Laterality Date  . LEEP    . WISDOM TOOTH EXTRACTION      Obstetric History: G1P1001  Family History:  Family History  Problem Relation Age of Onset  . Pancreatic cancer Paternal Aunt   . Bone cancer Maternal Grandmother   . Lung cancer Maternal Grandmother   . Colon cancer Paternal Grandfather   . Depression Mother   . Depression Father   . Suicidality Father     Social History:  Social History   Socioeconomic History  . Marital status: Married    Spouse name: Not on file  . Number of children: Not on file  . Years of education: Not on file  . Highest education level: Not on file  Occupational History  . Not on file  Social Needs  . Financial resource strain: Not on file  . Food insecurity    Worry: Not on file    Inability: Not on file  . Transportation needs    Medical: Not on file    Non-medical: Not on file  Tobacco Use  . Smoking status: Never Smoker  . Smokeless tobacco: Never Used  Substance and Sexual Activity  . Alcohol use:  No  . Drug use: No  . Sexual activity: Yes    Birth control/protection: Pill  Lifestyle  . Physical activity    Days per week: Not on file    Minutes per session: Not on file  . Stress: Not on file  Relationships  . Social Musicianconnections    Talks on phone: Not on file    Gets together: Not on file    Attends religious service: Not on file    Active member of club or organization: Not on file    Attends meetings of clubs or organizations: Not on file    Relationship status: Not on file  . Intimate partner violence    Fear of current or ex partner: Not on file    Emotionally abused: Not on file    Physically abused: Not on file    Forced sexual activity: Not on file  Other Topics Concern  . Not on file  Social History Narrative  . Not on file    Allergies:  Allergies  Allergen Reactions  . Penicillins Other  (See Comments)    Unknown, childhood reaction    Medications: Prior to Admission medications   Medication Sig Start Date End Date Taking? Authorizing Provider  lisinopril (PRINIVIL,ZESTRIL) 10 MG tablet TAKE 1 TABLET DAILY 11/07/18  Yes Galen ManilaKennedy, Lauren Renee, NP  Norethindrone-Ethinyl Estradiol-Fe Biphas (LO LOESTRIN FE) 1 MG-10 MCG / 10 MCG tablet Take 1 tablet by mouth daily. 07/08/18 09/30/18  Vena AustriaStaebler, Harmoney Sienkiewicz, MD    Physical Exam Blood pressure 138/78, pulse 80, height 5\' 6"  (1.676 m), weight 213 lb (96.6 kg).  No LMP recorded. (Menstrual status: Oral contraceptives).  General: NAD HEENT: normocephalic, anicteric Thyroid: no enlargement, no palpable nodules Pulmonary: No increased work of breathing Extremities: no edema, erythema, or tenderness Neurologic: Grossly intact Psychiatric: mood appropriate, affect full  Female chaperone present for pelvic portions of the physical exam  Assessment: 33 y.o. G1P1001 with abnormal uterine bleeding  Plan: Problem List Items Addressed This Visit    None    Visit Diagnoses    Abnormal uterine bleeding (AUB)    -  Primary   Relevant Orders   TSH+Prl+FSH+TestT+LH+DHEA S...   US Transvaginal Non-OB      1) Discussed management options for abnormal uterine bleeding including expectant, NSAIDs, tranexamic acid (Lysteda), oral progesterone (Provera, norethindrone, megace), Depo Provera, Levonorgestrel containing IUD, endometrial ablation (Novasure) or hysterectomy as definitive surgical management.  Discussed risks and benefits of each method.   Final management decision will hinge on results of patient's work up and whether an underlying etiology for the patients bleeding symptoms can be discerned.  We will conduct a basic work up examining using the PALM-COIEN classification system.  In the meantime the patient opts to trial slynd while we await results of her ultrasound and labs.  The role of unopposed estrogen in the development of  endometrial hyperplasia or carcinoma is discussed.  The risk of endometrial hyperplasia is linearly correlated with increasing BMI given the production of estrone by adipose tissue. Printed patient education handouts were given to the patient to review at home.  Bleeding precautions reviewed.  - bleeding off and on for the last 2 months - switched to LoLoestrin Fe 06/2018 so may be secondary to the decrease in OCP dose, switched to slynd  2) No follow-ups on file.   Vena AustriaAndreas Shakyra Mattera, MD, Evern CoreFACOG [facility], [facility] 03/23/2019, 3:30 PM

## 2019-03-31 ENCOUNTER — Ambulatory Visit (INDEPENDENT_AMBULATORY_CARE_PROVIDER_SITE_OTHER): Payer: BC Managed Care – PPO

## 2019-03-31 ENCOUNTER — Ambulatory Visit (INDEPENDENT_AMBULATORY_CARE_PROVIDER_SITE_OTHER): Payer: BC Managed Care – PPO | Admitting: Obstetrics and Gynecology

## 2019-03-31 ENCOUNTER — Other Ambulatory Visit: Payer: Self-pay

## 2019-03-31 ENCOUNTER — Encounter: Payer: Self-pay | Admitting: Obstetrics and Gynecology

## 2019-03-31 VITALS — BP 116/68 | HR 120 | Wt 211.0 lb

## 2019-03-31 DIAGNOSIS — N939 Abnormal uterine and vaginal bleeding, unspecified: Secondary | ICD-10-CM

## 2019-03-31 DIAGNOSIS — N83201 Unspecified ovarian cyst, right side: Secondary | ICD-10-CM | POA: Diagnosis not present

## 2019-03-31 LAB — POCT URINE PREGNANCY: Preg Test, Ur: NEGATIVE

## 2019-03-31 NOTE — Progress Notes (Signed)
Gynecology Ultrasound Follow Up  Chief Complaint:  Chief Complaint  Patient presents with  . Follow-up    GYN ultrasound     History of Present Illness: Patient is a 33 y.o. female who presents today for ultrasound evaluation of AUB .  Ultrasound demonstrates the following findgins Adnexa: cyst seen  Uterus: Non-enlarged, no firboids with endometrial stripe  Thin and without abnormalties  Additional: no free fluid  No bleeding since switching to slynd last week  Review of Systems: Review of Systems  Constitutional: Negative.   Gastrointestinal: Positive for abdominal pain.  Genitourinary: Negative.     Past Medical History:  Past Medical History:  Diagnosis Date  . Abnormal Pap smear   . Abnormal Pap smear of cervix   . Anxiety     Past Surgical History:  Past Surgical History:  Procedure Laterality Date  . LEEP    . WISDOM TOOTH EXTRACTION      Gynecologic History:  No LMP recorded. (Menstrual status: Oral contraceptives).   Family History:  Family History  Problem Relation Age of Onset  . Pancreatic cancer Paternal Aunt   . Bone cancer Maternal Grandmother   . Lung cancer Maternal Grandmother   . Colon cancer Paternal Grandfather   . Depression Mother   . Depression Father   . Suicidality Father     Social History:  Social History   Socioeconomic History  . Marital status: Married    Spouse name: Not on file  . Number of children: Not on file  . Years of education: Not on file  . Highest education level: Not on file  Occupational History  . Not on file  Social Needs  . Financial resource strain: Not on file  . Food insecurity    Worry: Not on file    Inability: Not on file  . Transportation needs    Medical: Not on file    Non-medical: Not on file  Tobacco Use  . Smoking status: Never Smoker  . Smokeless tobacco: Never Used  Substance and Sexual Activity  . Alcohol use: No  . Drug use: No  . Sexual activity: Yes    Birth  control/protection: Pill  Lifestyle  . Physical activity    Days per week: Not on file    Minutes per session: Not on file  . Stress: Not on file  Relationships  . Social Musicianconnections    Talks on phone: Not on file    Gets together: Not on file    Attends religious service: Not on file    Active member of club or organization: Not on file    Attends meetings of clubs or organizations: Not on file    Relationship status: Not on file  . Intimate partner violence    Fear of current or ex partner: Not on file    Emotionally abused: Not on file    Physically abused: Not on file    Forced sexual activity: Not on file  Other Topics Concern  . Not on file  Social History Narrative  . Not on file    Allergies:  Allergies  Allergen Reactions  . Penicillins Other (See Comments)    Unknown, childhood reaction    Medications: Prior to Admission medications   Medication Sig Start Date End Date Taking? Authorizing Provider  Drospirenone (SLYND) 4 MG TABS Take 1 tablet by mouth daily. 03/23/19  Yes Vena AustriaStaebler, Lailanie Hasley, MD  lisinopril (PRINIVIL,ZESTRIL) 10 MG tablet TAKE 1 TABLET DAILY 11/07/18  Yes Mikey College, NP    Physical Exam Vitals: Blood pressure 116/68, pulse (!) 120, weight 211 lb (95.7 kg).  General: NAD HEENT: normocephalic, anicteric Pulmonary: No increased work of breathing Extremities: no edema, erythema, or tenderness Neurologic: Grossly intact, normal gait Psychiatric: mood appropriate, affect full  US Transvaginal Non-ob  Result Date: 03/31/2019 Patient Name: Stacey Huynh DOB: 1986-09-13 MRN: [id number] ULTRASOUND REPORT Location: [facility] Date of Service: 03/31/2019 Indications:Abnormal Uterine Bleeding Findings: The uterus is anteverted and measures 7.5 x 4.7 x 3.2cm. Echo texture is homogenous without evidence of focal masses. The Endometrium measures 7.9 mm. Right Ovary measures 5.0 x 4.6 x 3.8 cm. It is not normal in appearance. There is a cyst  with anechoic fluid and a daughter cyst. The overall cyst measurement was 4.2 x 3.6 x 4.1 cm. The daughter cyst measures 2.6 x 2.3 x 2.3 cm. Normal blood flow is seen within the right ovary. Left Ovary measures 2.2 x 1.4 x 1.5 cm. It is normal in appearance. Survey of the adnexa demonstrates no adnexal masses. There is no free fluid in the cul de sac. Impression: 1. Normal appearing uterus, endometrium,  cervix and left ovary. 2. There is a 4.2 cm cyst in the right ovary with anechoic fluid and a simple daughter cyst Recommendations: 1.Clinical correlation with the patient's History and Physical Exam. Gweneth Dimitri, RT Images reviewed.  Normal GYN study without visualized pathology to explain patient's abnormal uterine bleeding. The right ovary contains a cyst with a small internal cyst noted.  No blood flow noted in cyst wall.  The cyst itself is relatively thin walled an may represent a cyst of morgagni. Malachy Mood, MD, [facility], [facility] 03/31/2019, 10:43 PM   Assessment: 33 y.o. G1P1001 No problem-specific Assessment & Plan notes found for this encounter.   Plan: Problem List Items Addressed This Visit    None    Visit Diagnoses    Abnormal uterine bleeding    -  Primary   Relevant Orders   POCT urine pregnancy (Completed)   Right ovarian cyst          1) AUB - no abnormal findings to explain on study today.  Given coincided with switch to LoLoestrin suspect iatrogenic.  Will monitor to see how continues to do on slynd  2) Right ovarian cyst - suspect cyst of morgagni.  However given smaller internal cyst UPT obtained to rule out ectopic pregnancy and this actually being an extrauterine yolk sac.  UPT negative.  Will re-iimage in 4-6 weeks.  If still present, symptoms worsen, and continued issues with bleeding requiring any operative intervention can address bleeding and cyst concurrently   3) A total of 15 minutes were spent in face-to-face contact  with the patient during this encounter with over half of that time devoted to counseling and coordination of care.  4) Return in about 1 week (around 04/07/2019) for ROB.    Malachy Mood, MD, [facility], [facility] 03/31/2019, 11:50 PM

## 2019-04-02 LAB — TSH+PRL+FSH+TESTT+LH+DHEA S...
17-Hydroxyprogesterone: 20 ng/dL
Androstenedione: 86 ng/dL (ref 41–262)
DHEA-SO4: 384.5 ug/dL — ABNORMAL HIGH (ref 84.8–378.0)
FSH: 5.8 m[IU]/mL
LH: 6.4 m[IU]/mL
Prolactin: 13.8 ng/mL (ref 4.8–23.3)
TSH: 2.38 u[IU]/mL (ref 0.450–4.500)
Testosterone, Free: 5.5 pg/mL — ABNORMAL HIGH (ref 0.0–4.2)
Testosterone: 48 ng/dL (ref 8–48)

## 2019-04-05 ENCOUNTER — Telehealth: Payer: Self-pay | Admitting: Obstetrics and Gynecology

## 2019-04-05 NOTE — Telephone Encounter (Signed)
Surgery Date: 04/13/2019  LOS: same day surgery  Surgery Booking Request Patient Full Name: KENZLY ROGOFF MRN: [id number]  DOB: 12-18-1985  Surgeon: Malachy Mood, MD  Requested Surgery Date and Time: 04/13/2019 Primary Diagnosis and Code: Right ovarian cyst Secondary Diagnosis and Code: Pelvic pain Surgical Procedure: laparoscopic right ovarian cystectomy L&D Notification:N/A Admission Status: same day surgery Length of Surgery: 1hr Special Case Needs: none H&P: can be day of surgery of week of surgery (date) Phone Interview or Office Pre-Admit: phone interview Interpreter: No Language: English Medical Clearance: No Special Scheduling Instructions: none

## 2019-04-05 NOTE — Telephone Encounter (Signed)
Patient is aware of H&P day of surgery, Pre-admit testing phone interview to be scheduled, COVID testing on Monday, 04/10/19 @ 8-10:30am at the [facility], and OR on 04/13/19. Patient is aware she will be asked to quarantine after COVID testing. Patient is aware she may receive calls from the [place] and [facility] Patient confirmed BCBS and no secondary insurance. Patient will watch for MyChart notification for Pre-admit phone interview.

## 2019-04-07 ENCOUNTER — Encounter
Admission: RE | Admit: 2019-04-07 | Discharge: 2019-04-07 | Disposition: A | Discharge status: Home / Self Care | Payer: BC Managed Care – PPO | Source: Ambulatory Visit | Attending: Obstetrics and Gynecology | Admitting: Obstetrics and Gynecology

## 2019-04-07 ENCOUNTER — Other Ambulatory Visit: Payer: Self-pay

## 2019-04-07 NOTE — Patient Instructions (Signed)
Your procedure is scheduled on: thurs 7/2 Report to Day Surgery. To find out your arrival time please call [phone] between 1PM - 3PM on Wed. 7/1.  Remember: Instructions that are not followed completely may result in serious medical risk,  up to and including death, or upon the discretion of your surgeon and anesthesiologist your  surgery may need to be rescheduled.     _X__ 1. Do not eat food after midnight the night before your procedure.                 No gum chewing or hard candies. You may drink clear liquids up to 2 hours                 before you are scheduled to arrive for your surgery- DO not drink clear                 liquids within 2 hours of the start of your surgery.                 Clear Liquids include:  water, apple juice without pulp, clear carbohydrate                 drink                                Complete your drink provided before you leave home                 such as Clearfast of Gatorade, Black Coffee or Tea (Do not add                 anything to coffee or tea).  __X__2.  On the morning of surgery brush your teeth with toothpaste and water, you                may rinse your mouth with mouthwash if you wish.  Do not swallow any toothpaste of mouthwash.     _X__ 3.  No Alcohol for 24 hours before or after surgery.   ___ 4.  Do Not Smoke or use e-cigarettes For 24 Hours Prior to Your Surgery.                 Do not use any chewable tobacco products for at least 6 hours prior to                 surgery.  ____  5.  Bring all medications with you on the day of surgery if instructed.   __x__  6.  Notify your doctor if there is any change in your medical condition      (cold, fever, infections).     Do not wear jewelry, make-up, hairpins, clips or nail polish. Do not wear lotions, powders, or perfumes. You may wear deodorant. Do not shave 48 hours prior to surgery. Men may shave face and neck. Do not bring valuables to the  hospital.    [facility] is not responsible for any belongings or valuables.  Contacts, dentures or bridgework may not be worn into surgery. Leave your suitcase in the car. After surgery it may be brought to your room. For patients admitted to the hospital, discharge time is determined by your treatment team.   Patients discharged the day of surgery will not be allowed to drive home.   Please read over the following fact sheets that you were given:  _x___ Take these medicines the morning of surgery with A SIP OF WATER  1. none  2.   3.   4.  5.  6.  ____ Fleet Enema (as directed)   __x__ Use CHG Soap as directed  ____ Use inhalers on the day of surgery  ____ Stop metformin 2 days prior to surgery    ____ Take 1/2 of usual insulin dose the night before surgery. No insulin the morning          of surgery.   ____ Stop Coumadin/Plavix/aspirin on   ____ Stop Anti-inflammatories Ibuprofen Aleve or Aspirin.  Today     May take Tylenol   ____ Stop supplements until after surgery.    ____ Bring C-Pap to the hospital.

## 2019-04-10 ENCOUNTER — Other Ambulatory Visit
Admission: RE | Admit: 2019-04-10 | Discharge: 2019-04-10 | Disposition: A | Discharge status: Home / Self Care | Payer: BC Managed Care – PPO | Source: Ambulatory Visit | Attending: Obstetrics and Gynecology | Admitting: Obstetrics and Gynecology

## 2019-04-10 ENCOUNTER — Other Ambulatory Visit: Payer: Self-pay

## 2019-04-10 DIAGNOSIS — Z1159 Encounter for screening for other viral diseases: Secondary | ICD-10-CM | POA: Insufficient documentation

## 2019-04-10 LAB — TYPE AND SCREEN
ABO/RH(D): A POS
Antibody Screen: NEGATIVE

## 2019-04-11 LAB — NOVEL CORONAVIRUS, NAA (HOSP ORDER, SEND-OUT TO REF LAB; TAT 18-24 HRS): SARS-CoV-2, NAA: NOT DETECTED

## 2019-04-13 ENCOUNTER — Encounter: Payer: Self-pay | Admitting: Emergency Medicine

## 2019-04-13 ENCOUNTER — Other Ambulatory Visit: Payer: Self-pay

## 2019-04-13 ENCOUNTER — Ambulatory Visit: Payer: BC Managed Care – PPO | Admitting: Certified Registered Nurse Anesthetist

## 2019-04-13 ENCOUNTER — Encounter
Admission: RE | Disposition: A | Discharge status: Home / Self Care | Payer: Self-pay | Source: Home / Self Care | Attending: Obstetrics and Gynecology

## 2019-04-13 ENCOUNTER — Ambulatory Visit
Admission: RE | Admit: 2019-04-13 | Discharge: 2019-04-13 | Disposition: A | Discharge status: Home / Self Care | Payer: BC Managed Care – PPO | Attending: Obstetrics and Gynecology | Admitting: Obstetrics and Gynecology

## 2019-04-13 DIAGNOSIS — N83201 Unspecified ovarian cyst, right side: Secondary | ICD-10-CM | POA: Diagnosis not present

## 2019-04-13 DIAGNOSIS — E669 Obesity, unspecified: Secondary | ICD-10-CM | POA: Diagnosis not present

## 2019-04-13 DIAGNOSIS — N838 Other noninflammatory disorders of ovary, fallopian tube and broad ligament: Secondary | ICD-10-CM | POA: Diagnosis not present

## 2019-04-13 DIAGNOSIS — Z6833 Body mass index (BMI) 33.0-33.9, adult: Secondary | ICD-10-CM | POA: Diagnosis not present

## 2019-04-13 DIAGNOSIS — I10 Essential (primary) hypertension: Secondary | ICD-10-CM | POA: Diagnosis not present

## 2019-04-13 DIAGNOSIS — F419 Anxiety disorder, unspecified: Secondary | ICD-10-CM | POA: Insufficient documentation

## 2019-04-13 DIAGNOSIS — Z9889 Other specified postprocedural states: Secondary | ICD-10-CM

## 2019-04-13 DIAGNOSIS — Q504 Embryonic cyst of fallopian tube: Secondary | ICD-10-CM | POA: Diagnosis not present

## 2019-04-13 DIAGNOSIS — N8301 Follicular cyst of right ovary: Secondary | ICD-10-CM | POA: Diagnosis not present

## 2019-04-13 HISTORY — PX: LAPAROSCOPIC OVARIAN CYSTECTOMY: SHX6248

## 2019-04-13 LAB — POCT PREGNANCY, URINE: Preg Test, Ur: NEGATIVE

## 2019-04-13 SURGERY — EXCISION, CYST, OVARY, LAPAROSCOPIC
Anesthesia: General | Laterality: Right

## 2019-04-13 MED ORDER — KETOROLAC TROMETHAMINE 30 MG/ML IJ SOLN
INTRAMUSCULAR | Status: DC | PRN
  Administered 2019-04-13: 30 mg via INTRAVENOUS

## 2019-04-13 MED ORDER — OXYCODONE-ACETAMINOPHEN 5-325 MG PO TABS: 1.0000 | ORAL_TABLET | ORAL | 0 refills | Status: DC | PRN

## 2019-04-13 MED ORDER — ACETAMINOPHEN NICU IV SYRINGE 10 MG/ML
INTRAVENOUS | Status: AC
  Filled 2019-04-13: qty 1

## 2019-04-13 MED ORDER — FENTANYL CITRATE (PF) 100 MCG/2ML IJ SOLN
INTRAMUSCULAR | Status: AC
  Filled 2019-04-13: qty 2

## 2019-04-13 MED ORDER — ONDANSETRON HCL 4 MG/2ML IJ SOLN
INTRAMUSCULAR | Status: DC | PRN
  Administered 2019-04-13: 4 mg via INTRAVENOUS

## 2019-04-13 MED ORDER — PROPOFOL 10 MG/ML IV BOLUS
INTRAVENOUS | Status: DC | PRN
  Administered 2019-04-13: 180 mg via INTRAVENOUS

## 2019-04-13 MED ORDER — DEXAMETHASONE SODIUM PHOSPHATE 10 MG/ML IJ SOLN
INTRAMUSCULAR | Status: AC
  Filled 2019-04-13: qty 1

## 2019-04-13 MED ORDER — LIDOCAINE HCL (CARDIAC) PF 100 MG/5ML IV SOSY
PREFILLED_SYRINGE | INTRAVENOUS | Status: DC | PRN
  Administered 2019-04-13: 100 mg via INTRAVENOUS

## 2019-04-13 MED ORDER — OXYCODONE HCL 5 MG PO TABS
5.0000 mg | ORAL_TABLET | Freq: Once | ORAL | Status: AC | PRN
  Administered 2019-04-13: 5 mg via ORAL

## 2019-04-13 MED ORDER — LACTATED RINGERS IV SOLN
INTRAVENOUS | Status: DC
  Administered 2019-04-13: 11:00:00 via INTRAVENOUS

## 2019-04-13 MED ORDER — FENTANYL CITRATE (PF) 100 MCG/2ML IJ SOLN
INTRAMUSCULAR | Status: DC | PRN
  Administered 2019-04-13 (×4): 50 ug via INTRAVENOUS

## 2019-04-13 MED ORDER — MIDAZOLAM HCL 2 MG/2ML IJ SOLN
INTRAMUSCULAR | Status: AC
  Filled 2019-04-13: qty 2

## 2019-04-13 MED ORDER — DEXAMETHASONE SODIUM PHOSPHATE 10 MG/ML IJ SOLN
INTRAMUSCULAR | Status: DC | PRN
  Administered 2019-04-13: 10 mg via INTRAVENOUS

## 2019-04-13 MED ORDER — BUPIVACAINE HCL 0.5 % IJ SOLN
INTRAMUSCULAR | Status: DC | PRN
  Administered 2019-04-13: 15 mL

## 2019-04-13 MED ORDER — PHENYLEPHRINE HCL (PRESSORS) 10 MG/ML IV SOLN
INTRAVENOUS | Status: DC | PRN
  Administered 2019-04-13 (×2): 100 ug via INTRAVENOUS

## 2019-04-13 MED ORDER — FENTANYL CITRATE (PF) 100 MCG/2ML IJ SOLN
25.0000 ug | INTRAMUSCULAR | Status: DC | PRN
  Administered 2019-04-13 (×4): 25 ug via INTRAVENOUS

## 2019-04-13 MED ORDER — BUPIVACAINE HCL (PF) 0.5 % IJ SOLN
INTRAMUSCULAR | Status: AC
  Filled 2019-04-13: qty 30

## 2019-04-13 MED ORDER — ONDANSETRON HCL 4 MG/2ML IJ SOLN
INTRAMUSCULAR | Status: AC
  Filled 2019-04-13: qty 2

## 2019-04-13 MED ORDER — KETOROLAC TROMETHAMINE 30 MG/ML IJ SOLN
INTRAMUSCULAR | Status: AC
  Filled 2019-04-13: qty 1

## 2019-04-13 MED ORDER — FAMOTIDINE 20 MG PO TABS
20.0000 mg | ORAL_TABLET | Freq: Once | ORAL | Status: AC
  Administered 2019-04-13: 11:00:00 20 mg via ORAL

## 2019-04-13 MED ORDER — OXYCODONE HCL 5 MG PO TABS
ORAL_TABLET | ORAL | Status: AC
  Filled 2019-04-13: qty 1

## 2019-04-13 MED ORDER — FAMOTIDINE 20 MG PO TABS
ORAL_TABLET | ORAL | Status: AC
  Administered 2019-04-13: 20 mg via ORAL
  Filled 2019-04-13: qty 1

## 2019-04-13 MED ORDER — SODIUM CHLORIDE FLUSH 0.9 % IV SOLN
INTRAVENOUS | Status: AC
  Filled 2019-04-13: qty 10

## 2019-04-13 MED ORDER — SUGAMMADEX SODIUM 200 MG/2ML IV SOLN
INTRAVENOUS | Status: DC | PRN
  Administered 2019-04-13: 190.6 mg via INTRAVENOUS

## 2019-04-13 MED ORDER — SUGAMMADEX SODIUM 200 MG/2ML IV SOLN
INTRAVENOUS | Status: AC
  Filled 2019-04-13: qty 2

## 2019-04-13 MED ORDER — MIDAZOLAM HCL 2 MG/2ML IJ SOLN
INTRAMUSCULAR | Status: DC | PRN
  Administered 2019-04-13: 2 mg via INTRAVENOUS

## 2019-04-13 MED ORDER — ROCURONIUM BROMIDE 100 MG/10ML IV SOLN
INTRAVENOUS | Status: DC | PRN
  Administered 2019-04-13: 40 mg via INTRAVENOUS

## 2019-04-13 MED ORDER — PROMETHAZINE HCL 25 MG/ML IJ SOLN
INTRAMUSCULAR | Status: AC
  Administered 2019-04-13: 6.25 mg via INTRAVENOUS
  Filled 2019-04-13: qty 1

## 2019-04-13 MED ORDER — HEMOSTATIC AGENTS (NO CHARGE) OPTIME
TOPICAL | Status: DC | PRN
  Administered 2019-04-13: 1 via TOPICAL

## 2019-04-13 MED ORDER — SCOPOLAMINE 1 MG/3DAYS TD PT72
MEDICATED_PATCH | TRANSDERMAL | Status: AC
  Administered 2019-04-13: 12:00:00
  Filled 2019-04-13: qty 1

## 2019-04-13 MED ORDER — ROCURONIUM BROMIDE 50 MG/5ML IV SOLN
INTRAVENOUS | Status: AC
  Filled 2019-04-13: qty 1

## 2019-04-13 MED ORDER — OXYCODONE HCL 5 MG/5ML PO SOLN: 5.0000 mg | Freq: Once | ORAL | Status: AC | PRN

## 2019-04-13 MED ORDER — PROMETHAZINE HCL 25 MG/ML IJ SOLN
6.2500 mg | INTRAMUSCULAR | Status: DC | PRN
  Administered 2019-04-13: 14:00:00 6.25 mg via INTRAVENOUS

## 2019-04-13 MED ORDER — FENTANYL CITRATE (PF) 100 MCG/2ML IJ SOLN
INTRAMUSCULAR | Status: AC
  Administered 2019-04-13: 25 ug via INTRAVENOUS
  Filled 2019-04-13: qty 2

## 2019-04-13 SURGICAL SUPPLY — 38 items
ANCHOR TIS RET SYS 235ML (MISCELLANEOUS) IMPLANT
BAG URINE DRAINAGE (UROLOGICAL SUPPLIES) ×2 IMPLANT
BLADE SURG SZ11 CARB STEEL (BLADE) ×2 IMPLANT
CANISTER SUCT 1200ML W/VALVE (MISCELLANEOUS) ×2 IMPLANT
CATH FOLEY 2WAY  5CC 16FR (CATHETERS) ×1
CATH URTH 16FR FL 2W BLN LF (CATHETERS) ×1 IMPLANT
CHLORAPREP W/TINT 26 (MISCELLANEOUS) ×2 IMPLANT
COVER WAND RF STERILE (DRAPES) ×2 IMPLANT
DERMABOND ADVANCED (GAUZE/BANDAGES/DRESSINGS) ×1
DERMABOND ADVANCED .7 DNX12 (GAUZE/BANDAGES/DRESSINGS) ×1 IMPLANT
DRESSING SURGICEL FIBRLLR 1X2 (HEMOSTASIS) IMPLANT
DRSG SURGICEL FIBRILLAR 1X2 (HEMOSTASIS) ×2
GLOVE BIO SURGEON STRL SZ7 (GLOVE) ×5 IMPLANT
GLOVE INDICATOR 7.5 STRL GRN (GLOVE) ×5 IMPLANT
GOWN STRL REUS W/ TWL LRG LVL3 (GOWN DISPOSABLE) ×2 IMPLANT
GOWN STRL REUS W/ TWL XL LVL3 (GOWN DISPOSABLE) IMPLANT
GOWN STRL REUS W/TWL LRG LVL3 (GOWN DISPOSABLE) ×2
GOWN STRL REUS W/TWL XL LVL3 (GOWN DISPOSABLE)
GRASPER SUT TROCAR 14GX15 (MISCELLANEOUS) IMPLANT
IRRIGATION STRYKERFLOW (MISCELLANEOUS) IMPLANT
IRRIGATOR STRYKERFLOW (MISCELLANEOUS)
IV LACTATED RINGERS 1000ML (IV SOLUTION) ×2 IMPLANT
KIT PINK PAD W/HEAD ARE REST (MISCELLANEOUS) ×2
KIT PINK PAD W/HEAD ARM REST (MISCELLANEOUS) ×1 IMPLANT
KIT TURNOVER CYSTO (KITS) ×2 IMPLANT
LABEL OR SOLS (LABEL) ×2 IMPLANT
NS IRRIG 500ML POUR BTL (IV SOLUTION) ×2 IMPLANT
PACK GYN LAPAROSCOPIC (MISCELLANEOUS) ×2 IMPLANT
PAD OB MATERNITY 4.3X12.25 (PERSONAL CARE ITEMS) ×2 IMPLANT
PAD PREP 24X41 OB/GYN DISP (PERSONAL CARE ITEMS) ×2 IMPLANT
SCISSORS METZENBAUM CVD 33 (INSTRUMENTS) IMPLANT
SET TUBE SMOKE EVAC HIGH FLOW (TUBING) ×2 IMPLANT
SHEARS HARMONIC ACE PLUS 36CM (ENDOMECHANICALS) ×2 IMPLANT
SLEEVE ENDOPATH XCEL 5M (ENDOMECHANICALS) ×2 IMPLANT
SUT MNCRL AB 4-0 PS2 18 (SUTURE) ×2 IMPLANT
SUT VIC AB 2-0 UR6 27 (SUTURE) ×2 IMPLANT
TROCAR ENDO BLADELESS 11MM (ENDOMECHANICALS) ×2 IMPLANT
TROCAR XCEL NON-BLD 5MMX100MML (ENDOMECHANICALS) ×2 IMPLANT

## 2019-04-13 NOTE — H&P (Signed)
Date of Initial H&P: 03/31/2019  History reviewed, patient examined, no change in status, stable for surgery. Updated PE CV: RRR Lungs CTAB, no increased work of breathing   Malachy Mood, MD, [place], [facility] 04/13/2019, 12:11 PM

## 2019-04-13 NOTE — Op Note (Signed)
Preoperative Diagnosis: 1) 33 y.o. right ovarian cyst  Postoperative Diagnosis: 1) 33 y.o. right ovarian cyst 2) Right paratubal cyst cluster  Operation Performed: Diagnostic laparoscopy, right ovarian cystectomy, removal of right paratubal cyst  Indication: 32 y.o. G1P1001  with pelvic pain, persistent right ovarian cyst  Surgeon: Malachy Mood, MD  Anesthesia: General  Preoperative Antibiotics: none  Estimated Blood Loss: 10 mL  IV Fluids: 620mL  Urine Output:: 347mL  Drains or Tubes: none  Implants: none  Specimens Removed: right ovarian cyst wall, right paraovarian cyst  Complications: none  Intraoperative Findings: Left Normal tubes, ovary, and uterus.  Right ovary with simple cyst, small daughter cyst also visualized as seen on imaging.  The right tube had a cluster of three small paraovarian cysts on stalk.  Patient Condition: stable  Procedure in Detail:  Patient was taken to the operating room where she was administered general anesthesia.  She was positioned in the dorsal lithotomy position utilizing Allen stirups, prepped and draped in the usual sterile fashion.  Prior to proceeding with procedure a time out was performed.  Attention was turned to the patient's pelvis.  A red rubber catheter was used to empty the patient's bladder.  An operative speculum was placed to allow visualization of the cervix.  The anterior lip of the cervix was grasped with a single tooth tenaculum, and a Hulka tenaculum was placed to allow manipulation of the uterus.  The operative speculum and single tooth tenaculum were then removed.  Attention was turned to the patient's abdomen.  The umbilicus was infiltrated with 1% Sensorcaine, before making a stab incision using an 11 blade scalpel.  A 48mm Excel trocar was then used to gain direct entry into the peritoneal cavity utilizing the camera to visualize progress of the trocar during placement.  Once peritoneal entry had been achieved,  insufflation was started and pneumoperitoneum established at a pressure of 55mmHg. Two additional 72mm excel trocars were placed under direct visualiztion in the right and left lower quadrants.General inspection of the abdomen revealed the above noted findings.   The cortex over the right ovarian cyst was incised using laparoscopic scissors.  The cyst ruptured during this process noting clear fluid.  The cyst wall was able to be removed.  Inspected internal structure of cyst there was a small cyst within the cyst which was also removed.  Inspection the right tube revealed a small collection of three paratubal cyts which were removed using the laparoscopic scissors and cautery.  The ovarian cyst cavity was packed with fibrillar.    Pneumoperitoneum was evacuated.  The trocars were removed.  All trocar sites were then dressed with surgical skin glue.  The Hulka tenaculum was removed.  Sponge needle and instrument counts were correct time two.  The patient tolerated the procedure well and was taken to the recovery room in stable condition.

## 2019-04-13 NOTE — Transfer of Care (Signed)
Immediate Anesthesia Transfer of Care Note  Patient: Stacey Huynh  Procedure(s) Performed: LAPAROSCOPIC RIGHT OVARIAN CYSTECTOMY and removal of right paratubal cyst (Right )  Patient Location: PACU  Anesthesia Type:General  Level of Consciousness: awake and alert   Airway & Oxygen Therapy: Patient Spontanous Breathing and Patient connected to face mask oxygen  Post-op Assessment: Report given to RN and Post -op Vital signs reviewed and stable  Post vital signs: Reviewed and stable  Last Vitals:  Vitals Value Taken Time  BP 120/78 04/13/19 1350  Temp    Pulse 102 04/13/19 1351  Resp 11 04/13/19 1351  SpO2 100 % 04/13/19 1351  Vitals shown include unvalidated device data.  Last Pain:  Vitals:   04/13/19 1043  TempSrc: Temporal  PainSc: 0-No pain         Complications: No apparent anesthesia complications

## 2019-04-13 NOTE — Anesthesia Procedure Notes (Signed)
Procedure Name: Intubation Performed by: Mikaelah Trostle, CRNA Pre-anesthesia Checklist: Patient identified, Patient being monitored, Timeout performed, Emergency Drugs available and Suction available Patient Re-evaluated:Patient Re-evaluated prior to induction Oxygen Delivery Method: Circle system utilized Preoxygenation: Pre-oxygenation with 100% oxygen Induction Type: IV induction Ventilation: Mask ventilation without difficulty Laryngoscope Size: Mac and 3 Grade View: Grade I Tube type: Oral Tube size: 7.0 mm Number of attempts: 1 Airway Equipment and Method: Stylet Placement Confirmation: ETT inserted through vocal cords under direct vision,  positive ETCO2 and breath sounds checked- equal and bilateral Secured at: 22 cm Tube secured with: Tape Dental Injury: Teeth and Oropharynx as per pre-operative assessment        

## 2019-04-13 NOTE — Anesthesia Post-op Follow-up Note (Signed)
Anesthesia QCDR form completed.        

## 2019-04-13 NOTE — Anesthesia Preprocedure Evaluation (Addendum)
Anesthesia Evaluation  Patient identified by MRN, date of birth, ID band Patient awake    Reviewed: Allergy & Precautions, H&P , NPO status , Patient's Chart, lab work & pertinent test results  History of Anesthesia Complications Negative for: history of anesthetic complications  Airway Mallampati: II  TM Distance: >3 FB     Dental  (+) Teeth Intact   Pulmonary neg pulmonary ROS,           Cardiovascular hypertension,      Neuro/Psych PSYCHIATRIC DISORDERS Anxiety negative neurological ROS     GI/Hepatic negative GI ROS, Neg liver ROS,   Endo/Other  negative endocrine ROS  Renal/GU      Musculoskeletal   Abdominal   Peds  Hematology negative hematology ROS (+)   Anesthesia Other Findings Obese  Past Medical History: No date: Abnormal Pap smear No date: Abnormal Pap smear of cervix No date: Anxiety  Past Surgical History: No date: LEEP No date: WISDOM TOOTH EXTRACTION  BMI    Body Mass Index: 33.89 kg/m      Reproductive/Obstetrics negative OB ROS                            Anesthesia Physical Anesthesia Plan  ASA: II  Anesthesia Plan: General LMA   Post-op Pain Management:    Induction:   PONV Risk Score and Plan: 4 or greater and Dexamethasone, Ondansetron, Midazolam, Treatment may vary due to age or medical condition and Scopolamine patch - Pre-op  Airway Management Planned:   Additional Equipment:   Intra-op Plan:   Post-operative Plan:   Informed Consent: I have reviewed the patients History and Physical, chart, labs and discussed the procedure including the risks, benefits and alternatives for the proposed anesthesia with the patient or authorized representative who has indicated his/her understanding and acceptance.     Dental Advisory Given  Plan Discussed with: Anesthesiologist and CRNA  Anesthesia Plan Comments:        Anesthesia Quick  Evaluation

## 2019-04-13 NOTE — Discharge Instructions (Signed)

## 2019-04-14 LAB — ABO/RH: ABO/RH(D): A POS

## 2019-04-14 NOTE — Anesthesia Postprocedure Evaluation (Signed)
Anesthesia Post Note  Patient: Stacey Huynh  Procedure(s) Performed: LAPAROSCOPIC RIGHT OVARIAN CYSTECTOMY and removal of right paratubal cyst (Right )  Patient location during evaluation: PACU Anesthesia Type: General Level of consciousness: awake and alert Pain management: pain level controlled Vital Signs Assessment: post-procedure vital signs reviewed and stable Respiratory status: spontaneous breathing, nonlabored ventilation and respiratory function stable Cardiovascular status: blood pressure returned to baseline and stable Postop Assessment: no apparent nausea or vomiting Anesthetic complications: no     Last Vitals:  Vitals:   04/13/19 1439 04/13/19 1554  BP: 127/82 116/63  Pulse: 98 85  Resp: 18 16  Temp: 36.6 C   SpO2: 98% 100%    Last Pain:  Vitals:   04/13/19 1554  TempSrc:   PainSc: [place]

## 2019-04-15 ENCOUNTER — Encounter: Payer: Self-pay | Admitting: Obstetrics and Gynecology

## 2019-04-17 ENCOUNTER — Other Ambulatory Visit: Payer: Self-pay

## 2019-04-17 ENCOUNTER — Emergency Department
Admission: EM | Admit: 2019-04-17 | Discharge: 2019-04-17 | Disposition: A | Discharge status: Home / Self Care | Payer: BC Managed Care – PPO | Attending: Emergency Medicine | Admitting: Emergency Medicine

## 2019-04-17 ENCOUNTER — Encounter: Payer: Self-pay | Admitting: Emergency Medicine

## 2019-04-17 ENCOUNTER — Telehealth: Payer: Self-pay

## 2019-04-17 DIAGNOSIS — Z5321 Procedure and treatment not carried out due to patient leaving prior to being seen by health care provider: Secondary | ICD-10-CM | POA: Insufficient documentation

## 2019-04-17 DIAGNOSIS — R509 Fever, unspecified: Secondary | ICD-10-CM | POA: Insufficient documentation

## 2019-04-17 LAB — URINALYSIS, COMPLETE (UACMP) WITH MICROSCOPIC
Bilirubin Urine: NEGATIVE
Glucose, UA: NEGATIVE mg/dL
Ketones, ur: NEGATIVE mg/dL
Leukocytes,Ua: NEGATIVE
Nitrite: NEGATIVE
Protein, ur: NEGATIVE mg/dL
Specific Gravity, Urine: 1.01 (ref 1.005–1.030)
pH: 5 (ref 5.0–8.0)

## 2019-04-17 LAB — CBC WITH DIFFERENTIAL/PLATELET
Abs Immature Granulocytes: 0.03 10*3/uL (ref 0.00–0.07)
Basophils Absolute: 0.1 10*3/uL (ref 0.0–0.1)
Basophils Relative: 1 %
Eosinophils Absolute: 0.3 10*3/uL (ref 0.0–0.5)
Eosinophils Relative: 2 %
HCT: 47.2 % — ABNORMAL HIGH (ref 36.0–46.0)
Hemoglobin: 15.7 g/dL — ABNORMAL HIGH (ref 12.0–15.0)
Immature Granulocytes: 0 %
Lymphocytes Relative: 29 %
Lymphs Abs: 3.6 10*3/uL (ref 0.7–4.0)
MCH: 30.8 pg (ref 26.0–34.0)
MCHC: 33.3 g/dL (ref 30.0–36.0)
MCV: 92.7 fL (ref 80.0–100.0)
Monocytes Absolute: 0.6 10*3/uL (ref 0.1–1.0)
Monocytes Relative: 5 %
Neutro Abs: 8.1 10*3/uL — ABNORMAL HIGH (ref 1.7–7.7)
Neutrophils Relative %: 63 %
Platelets: 377 10*3/uL (ref 150–400)
RBC: 5.09 MIL/uL (ref 3.87–5.11)
RDW: 11.9 % (ref 11.5–15.5)
WBC: 12.7 10*3/uL — ABNORMAL HIGH (ref 4.0–10.5)
nRBC: 0 % (ref 0.0–0.2)

## 2019-04-17 LAB — COMPREHENSIVE METABOLIC PANEL
ALT: 23 U/L (ref 0–44)
AST: 23 U/L (ref 15–41)
Albumin: 4.3 g/dL (ref 3.5–5.0)
Alkaline Phosphatase: 69 U/L (ref 38–126)
Anion gap: 12 (ref 5–15)
BUN: 12 mg/dL (ref 6–20)
CO2: 22 mmol/L (ref 22–32)
Calcium: 9.3 mg/dL (ref 8.9–10.3)
Chloride: 104 mmol/L (ref 98–111)
Creatinine, Ser: 0.62 mg/dL (ref 0.44–1.00)
GFR calc Af Amer: >60 mL/min (ref >60)
GFR calc non Af Amer: >60 mL/min (ref >60)
Glucose, Bld: 89 mg/dL (ref 70–99)
Potassium: 3.9 mmol/L (ref 3.5–5.1)
Sodium: 138 mmol/L (ref 135–145)
Total Bilirubin: 0.5 mg/dL (ref 0.3–1.2)
Total Protein: 8.2 g/dL — ABNORMAL HIGH (ref 6.5–8.1)

## 2019-04-17 LAB — POCT PREGNANCY, URINE: Preg Test, Ur: NEGATIVE

## 2019-04-17 NOTE — Telephone Encounter (Signed)
Pt calling; had ovarian cystectomy last Thurs; woke this not feeling well this am; has fever of 100.3.  [phone]

## 2019-04-17 NOTE — Telephone Encounter (Signed)
Too late to work in now.  ER if worsens during evening.  Appt CRS otherwise

## 2019-04-17 NOTE — ED Triage Notes (Signed)
Pt presents to ED with fever today after a laparoscopic right ovarian cystectomy the middle of last week. Pt denies any other symptoms. temp was 100.3 at home.

## 2019-04-17 NOTE — Telephone Encounter (Signed)
Pt states she will monitor her symptoms and if they are worse she will call us tomorrow

## 2019-04-18 ENCOUNTER — Encounter: Payer: Self-pay | Admitting: Obstetrics & Gynecology

## 2019-04-18 ENCOUNTER — Ambulatory Visit (INDEPENDENT_AMBULATORY_CARE_PROVIDER_SITE_OTHER): Payer: BC Managed Care – PPO | Admitting: Obstetrics & Gynecology

## 2019-04-18 DIAGNOSIS — R5082 Postprocedural fever: Secondary | ICD-10-CM | POA: Diagnosis not present

## 2019-04-18 DIAGNOSIS — R5383 Other fatigue: Secondary | ICD-10-CM | POA: Diagnosis not present

## 2019-04-18 DIAGNOSIS — R110 Nausea: Secondary | ICD-10-CM | POA: Diagnosis not present

## 2019-04-18 LAB — SURGICAL PATHOLOGY

## 2019-04-18 NOTE — Progress Notes (Signed)
Virtual Visit via Telephone Note  I connected with Stacey Huynh on 04/18/19 at  1:50 PM EDT by telephone and verified that I am speaking with the correct person using two identifiers.   I discussed the limitations, risks, security and privacy concerns of performing an evaluation and management service by telephone and the availability of in person appointments. I also discussed with the patient that there may be a patient responsible charge related to this service. The patient expressed understanding and agreed to proceed.  She was at home and I was in my office.  History of Present Illness: Pt is a 33 yo WF who had surgery last week (Lap and Ovarian Cystectomy) who presents with 1 day h/o fever (high of 101) and associated nausea (mild) and fatigue.  No abdominal concerns and no incisional redness or drainage.  No period.  No SOB or CP.  No headache.  No dysuria.     Went to ER last night, has labs drawn but could not wait ot be seen by MD.  Observations/Objective: No exam today, due to telephone eVisit due to [facility] on elective visits and procedures.  Prior visits reviewed along with ultrasounds/labs as indicated.  Labs yesterday in ER: Results for orders placed or performed during the hospital encounter of 04/17/19  CBC with Differential  Result Value Ref Range   WBC 12.7 (H) 4.0 - 10.5 K/uL   RBC 5.09 3.87 - 5.11 MIL/uL   Hemoglobin 15.7 (H) 12.0 - 15.0 g/dL   HCT 22.047.2 (H) 25.436.0 - 27.046.0 %   MCV 92.7 80.0 - 100.0 fL   MCH 30.8 26.0 - 34.0 pg   MCHC 33.3 30.0 - 36.0 g/dL   RDW 62.311.9 76.211.5 - 83.115.5 %   Platelets 377 150 - 400 K/uL   nRBC 0.0 0.0 - 0.2 %   Neutrophils Relative % 63 %   Neutro Abs 8.1 (H) 1.7 - 7.7 K/uL   Lymphocytes Relative 29 %   Lymphs Abs 3.6 0.7 - 4.0 K/uL   Monocytes Relative 5 %   Monocytes Absolute 0.6 0.1 - 1.0 K/uL   Eosinophils Relative 2 %   Eosinophils Absolute 0.3 0.0 - 0.5 K/uL   Basophils Relative 1 %   Basophils Absolute 0.1 0.0  - 0.1 K/uL   Immature Granulocytes 0 %   Abs Immature Granulocytes 0.03 0.00 - 0.07 K/uL  Comprehensive metabolic panel  Result Value Ref Range   Sodium 138 135 - 145 mmol/L   Potassium 3.9 3.5 - 5.1 mmol/L   Chloride 104 98 - 111 mmol/L   CO2 22 22 - 32 mmol/L   Glucose, Bld 89 70 - 99 mg/dL   BUN 12 6 - 20 mg/dL   Creatinine, Ser 5.170.62 0.44 - 1.00 mg/dL   Calcium 9.3 8.9 - 61.610.3 mg/dL   Total Protein 8.2 (H) 6.5 - 8.1 g/dL   Albumin 4.3 3.5 - 5.0 g/dL   AST 23 15 - 41 U/L   ALT 23 0 - 44 U/L   Alkaline Phosphatase 69 38 - 126 U/L   Total Bilirubin 0.5 0.3 - 1.2 mg/dL   GFR calc non Af Amer >60 >60 mL/min   GFR calc Af Amer >60 >60 mL/min   Anion gap 12 5 - 15  Urinalysis, Complete w Microscopic  Result Value Ref Range   Color, Urine STRAW (A) YELLOW   APPearance CLEAR (A) CLEAR   Specific Gravity, Urine 1.010 1.005 - 1.030   pH 5.0  5.0 - 8.0   Glucose, UA NEGATIVE NEGATIVE mg/dL   Hgb urine dipstick MODERATE (A) NEGATIVE   Bilirubin Urine NEGATIVE NEGATIVE   Ketones, ur NEGATIVE NEGATIVE mg/dL   Protein, ur NEGATIVE NEGATIVE mg/dL   Nitrite NEGATIVE NEGATIVE   Leukocytes,Ua NEGATIVE NEGATIVE   RBC / HPF 0-5 0 - 5 RBC/hpf   WBC, UA 0-5 0 - 5 WBC/hpf   Bacteria, UA RARE (A) NONE SEEN   Squamous Epithelial / LPF 0-5 0 - 5   Mucus PRESENT   Pregnancy, urine POC  Result Value Ref Range   Preg Test, Ur NEGATIVE NEGATIVE   Assessment and Plan: Post-procedural fever  - No sign of UTI, URI, incisional infections, DVT - Concern for incidental infection or Corona Pt had neg covid19 test on 04/10/19 as part of preop - Plan: monitor for continued fever and associated sx's.  Consider repeat testing for Covid19.  Discuss w PCP if persists so they can order and arrange follow up  Fatigue, unspecified type  - Plan: rest, fluids  Nausea  - Plan: diet, OTC meds as needed  Follow Up Instructions: PRN, and for post op follow up next week as scheduled   I discussed the assessment  and treatment plan with the patient. The patient was provided an opportunity to ask questions and all were answered. The patient agreed with the plan and demonstrated an understanding of the instructions.   The patient was advised to call back or seek an in-person evaluation if the symptoms worsen or if the condition fails to improve as anticipated.  I provided 10 minutes of non-face-to-face time during this encounter.   Hoyt Koch, MD

## 2019-04-27 ENCOUNTER — Ambulatory Visit (INDEPENDENT_AMBULATORY_CARE_PROVIDER_SITE_OTHER): Payer: BC Managed Care – PPO | Admitting: Obstetrics and Gynecology

## 2019-04-27 ENCOUNTER — Other Ambulatory Visit: Payer: Self-pay | Admitting: Obstetrics and Gynecology

## 2019-04-27 ENCOUNTER — Other Ambulatory Visit: Payer: Self-pay

## 2019-04-27 ENCOUNTER — Encounter: Payer: Self-pay | Admitting: Obstetrics and Gynecology

## 2019-04-27 ENCOUNTER — Ambulatory Visit (INDEPENDENT_AMBULATORY_CARE_PROVIDER_SITE_OTHER): Payer: BC Managed Care – PPO

## 2019-04-27 VITALS — BP 118/76 | HR 78 | Ht 66.0 in | Wt 209.0 lb

## 2019-04-27 DIAGNOSIS — N83201 Unspecified ovarian cyst, right side: Secondary | ICD-10-CM

## 2019-04-27 DIAGNOSIS — Z4889 Encounter for other specified surgical aftercare: Secondary | ICD-10-CM

## 2019-04-27 MED ORDER — SLYND 4 MG PO TABS: 1.0000 | ORAL_TABLET | Freq: Every day | ORAL | 3 refills | Status: DC

## 2019-04-27 NOTE — Progress Notes (Signed)
Postoperative Follow-up Patient presents post op from laparoscopic right ovarian cystectomy 2weeks ago for pelvic pain.  Subjective: Patient reports marked improvement in her preop symptoms. Eating a regular diet without difficulty. The patient is not having any pain.  Activity: normal activities of daily living.  Objective: Blood pressure 118/76, pulse 78, height 5\' 6"  (1.676 m), weight 209 lb (94.8 kg), last menstrual period 04/17/2019.  General: NAD Pulmonary: no increased work of breathing Abdomen: soft, non-tender, non-distended, incision(s) D/C/I Extremities: no edema Neurologic: normal gait  US Pelvis Transvaginal Non-ob (tv Only)  Result Date: 04/27/2019 Patient Name: Stacey Huynh DOB: 20-Apr-1986 MRN: [id number] ULTRASOUND REPORT Location: [facility] Date of Service: 04/27/2019 Indications:  Evaluate for an ovarian cyst Findings: The uterus is anteverted and measures 7.4 x 3.4 x 3.2 cm. Echo texture is homogenous without evidence of focal masses. The Endometrium measures 1.4 mm. Right Ovary measures 2.8 x 2.6 x 1.5 cm. There is a solid appearing mass in the right ovary without blood flow measuring 1.5 x 1.3 x 1.5 cm. Left Ovary measures 2.6 x 1.6 x 1.8 cm. It is normal in appearance. Survey of the adnexa demonstrates no adnexal masses. There is no free fluid in the cul de sac. Impression: 1. Normal uterus and left ovary. 2. There is a 1.5 cm solid appearing mass without blood flow in the right ovary. Normal blood flow is the remainder of the right ovary. Recommendations: 1.Clinical correlation with the patient's History and Physical Exam. Gweneth Dimitri, RT Images reviewed.  Normal GYN study without visualized pathology.  The previously imaged right ovarian cysts are no longer visible following surgical resection.  The area of increased echo texture in the right ovary is attributable to Fibrillar placed in the base of the ovarian cyst following resection Malachy Mood,  MD, [place] 04/27/2019, 10:32 AM   US Transvaginal Non-ob  Result Date: 03/31/2019 Patient Name: Stacey Huynh DOB: January 15, 1986 MRN: [id number] ULTRASOUND REPORT Location: [facility] Date of Service: 03/31/2019 Indications:Abnormal Uterine Bleeding Findings: The uterus is anteverted and measures 7.5 x 4.7 x 3.2cm. Echo texture is homogenous without evidence of focal masses. The Endometrium measures 7.9 mm. Right Ovary measures 5.0 x 4.6 x 3.8 cm. It is not normal in appearance. There is a cyst with anechoic fluid and a daughter cyst. The overall cyst measurement was 4.2 x 3.6 x 4.1 cm. The daughter cyst measures 2.6 x 2.3 x 2.3 cm. Normal blood flow is seen within the right ovary. Left Ovary measures 2.2 x 1.4 x 1.5 cm. It is normal in appearance. Survey of the adnexa demonstrates no adnexal masses. There is no free fluid in the cul de sac. Impression: 1. Normal appearing uterus, endometrium,  cervix and left ovary. 2. There is a 4.2 cm cyst in the right ovary with anechoic fluid and a simple daughter cyst Recommendations: 1.Clinical correlation with the patient's History and Physical Exam. Gweneth Dimitri, RT Images reviewed.  Normal GYN study without visualized pathology to explain patient's abnormal uterine bleeding. The right ovary contains a cyst with a small internal cyst noted.  No blood flow noted in cyst wall.  The cyst itself is relatively thin walled an may represent a cyst of morgagni. Malachy Mood, MD, [facility], [facility] 03/31/2019, 10:43 PM    Admission on 04/17/2019, Discharged on 04/17/2019  Component Date Value Ref Range Status   WBC 04/17/2019 12.7* 4.0 - 10.5  K/uL Final   RBC 04/17/2019 5.09  3.87 - 5.11 MIL/uL Final   Hemoglobin 04/17/2019 15.7* 12.0 - 15.0 g/dL Final   HCT 16/10/960407/03/2019 47.2* 36.0 - 46.0 % Final   MCV 04/17/2019 92.7  80.0 - 100.0 fL Final   MCH 04/17/2019 30.8  26.0 - 34.0 pg Final    MCHC 04/17/2019 33.3  30.0 - 36.0 g/dL Final   RDW 54/09/811907/03/2019 11.9  11.5 - 15.5 % Final   Platelets 04/17/2019 377  150 - 400 K/uL Final   nRBC 04/17/2019 0.0  0.0 - 0.2 % Final   Neutrophils Relative % 04/17/2019 63  % Final   Neutro Abs 04/17/2019 8.1* 1.7 - 7.7 K/uL Final   Lymphocytes Relative 04/17/2019 29  % Final   Lymphs Abs 04/17/2019 3.6  0.7 - 4.0 K/uL Final   Monocytes Relative 04/17/2019 5  % Final   Monocytes Absolute 04/17/2019 0.6  0.1 - 1.0 K/uL Final   Eosinophils Relative 04/17/2019 2  % Final   Eosinophils Absolute 04/17/2019 0.3  0.0 - 0.5 K/uL Final   Basophils Relative 04/17/2019 1  % Final   Basophils Absolute 04/17/2019 0.1  0.0 - 0.1 K/uL Final   Immature Granulocytes 04/17/2019 0  % Final   Abs Immature Granulocytes 04/17/2019 0.03  0.00 - 0.07 K/uL Final   Performed at [facility], [address] [place], [place] [address]   Sodium 04/17/2019 138  135 - 145 mmol/L Final   Potassium 04/17/2019 3.9  3.5 - 5.1 mmol/L Final   Chloride 04/17/2019 104  98 - 111 mmol/L Final   CO2 04/17/2019 22  22 - 32 mmol/L Final   Glucose, Bld 04/17/2019 89  70 - 99 mg/dL Final   BUN 95/62/130807/03/2019 12  6 - 20 mg/dL Final   Creatinine, Ser 04/17/2019 0.62  0.44 - 1.00 mg/dL Final   Calcium 65/78/469607/03/2019 9.3  8.9 - 10.3 mg/dL Final   Total Protein 29/52/841307/03/2019 8.2* 6.5 - 8.1 g/dL Final   Albumin 24/40/102707/03/2019 4.3  3.5 - 5.0 g/dL Final   AST 25/36/644007/03/2019 23  15 - 41 U/L Final   ALT 04/17/2019 23  0 - 44 U/L Final   Alkaline Phosphatase 04/17/2019 69  38 - 126 U/L Final   Total Bilirubin 04/17/2019 0.5  0.3 - 1.2 mg/dL Final   GFR calc non Af Amer 04/17/2019 >60  >60 mL/min Final   GFR calc Af Amer 04/17/2019 >60  >60 mL/min Final   Anion gap 04/17/2019 12  5 - 15 Final   Performed at [facility], [address] [place], [place] [address]   Color, Urine 04/17/2019 STRAW* YELLOW Final   APPearance 04/17/2019 CLEAR* CLEAR Final    Specific Gravity, Urine 04/17/2019 1.010  1.005 - 1.030 Final   pH 04/17/2019 5.0  5.0 - 8.0 Final   Glucose, UA 04/17/2019 NEGATIVE  NEGATIVE mg/dL Final   Hgb urine dipstick 04/17/2019 MODERATE* NEGATIVE Final   Bilirubin Urine 04/17/2019 NEGATIVE  NEGATIVE Final   Ketones, ur 04/17/2019 NEGATIVE  NEGATIVE mg/dL Final   Protein, ur 59/56/387507/03/2019 NEGATIVE  NEGATIVE mg/dL Final   Nitrite 64/33/295107/03/2019 NEGATIVE  NEGATIVE Final   Leukocytes,Ua 04/17/2019 NEGATIVE  NEGATIVE Final   RBC / HPF 04/17/2019 0-5  0 - 5 RBC/hpf Final   WBC, UA 04/17/2019 0-5  0 - 5 WBC/hpf Final   Bacteria, UA 04/17/2019 RARE* NONE SEEN Final   Squamous Epithelial / LPF 04/17/2019 0-5  0 - 5 Final  Mucus 04/17/2019 PRESENT   Final   Performed at [facility], [address] [place], [place] [address]   Preg Test, Ur 04/17/2019 NEGATIVE  NEGATIVE Final   Comment:        THE SENSITIVITY OF THIS METHODOLOGY IS >24 mIU/mL     Assessment: 33 y.o. s/p laparoscopic right ovarian cystectomy stable  Plan: Patient has done well after surgery with no apparent complications.  I have discussed the post-operative course to date, and the expected progress moving forward.  The patient understands what complications to be concerned about.  I will see the patient in routine follow up, or sooner if needed.    - given 2 months samples of slynd  Activity plan: No restriction.   Vena AustriaAndreas Aarthi Uyeno, MD, Merlinda FrederickFACOG [facility], [facility] 04/27/2019, 9:41 AM

## 2019-05-29 ENCOUNTER — Ambulatory Visit: Payer: BC Managed Care – PPO | Admitting: Obstetrics and Gynecology

## 2019-05-29 ENCOUNTER — Ambulatory Visit (INDEPENDENT_AMBULATORY_CARE_PROVIDER_SITE_OTHER): Payer: BC Managed Care – PPO | Admitting: Obstetrics and Gynecology

## 2019-05-29 ENCOUNTER — Encounter: Payer: Self-pay | Admitting: Obstetrics and Gynecology

## 2019-05-29 ENCOUNTER — Other Ambulatory Visit: Payer: Self-pay

## 2019-05-29 VITALS — BP 112/62 | HR 73 | Ht 66.0 in | Wt 213.0 lb

## 2019-05-29 DIAGNOSIS — Z4889 Encounter for other specified surgical aftercare: Secondary | ICD-10-CM

## 2019-05-29 NOTE — Progress Notes (Signed)
Postoperative Follow-up Patient presents post op from laparoscopic ovarian cystectomy 6weeks ago for persistant adnexal cyst and pelvic pain.  Subjective: Patient reports marked improvement in her preop symptoms. Eating a regular diet without difficulty. The patient is not having any pain.  Activity: normal activities of daily living.  Objective: Blood pressure 112/62, pulse 73, height 5\' 6"  (1.676 m), weight 213 lb (96.6 kg).  General: NAD Pulmonary: no increased work of breathing Abdomen: soft, non-tender, non-distended, incision(s) D/C/I Extremities: no edema Neurologic: normal gait    Admission on 04/17/2019, Discharged on 04/17/2019  Component Date Value Ref Range Status  . WBC 04/17/2019 12.7* 4.0 - 10.5 K/uL Final  . RBC 04/17/2019 5.09  3.87 - 5.11 MIL/uL Final  . Hemoglobin 04/17/2019 15.7* 12.0 - 15.0 g/dL Final  . HCT 04/17/2019 47.2* 36.0 - 46.0 % Final  . MCV 04/17/2019 92.7  80.0 - 100.0 fL Final  . MCH 04/17/2019 30.8  26.0 - 34.0 pg Final  . MCHC 04/17/2019 33.3  30.0 - 36.0 g/dL Final  . RDW 04/17/2019 11.9  11.5 - 15.5 % Final  . Platelets 04/17/2019 377  150 - 400 K/uL Final  . nRBC 04/17/2019 0.0  0.0 - 0.2 % Final  . Neutrophils Relative % 04/17/2019 63  % Final  . Neutro Abs 04/17/2019 8.1* 1.7 - 7.7 K/uL Final  . Lymphocytes Relative 04/17/2019 29  % Final  . Lymphs Abs 04/17/2019 3.6  0.7 - 4.0 K/uL Final  . Monocytes Relative 04/17/2019 5  % Final  . Monocytes Absolute 04/17/2019 0.6  0.1 - 1.0 K/uL Final  . Eosinophils Relative 04/17/2019 2  % Final  . Eosinophils Absolute 04/17/2019 0.3  0.0 - 0.5 K/uL Final  . Basophils Relative 04/17/2019 1  % Final  . Basophils Absolute 04/17/2019 0.1  0.0 - 0.1 K/uL Final  . Immature Granulocytes 04/17/2019 0  % Final  . Abs Immature Granulocytes 04/17/2019 0.03  0.00 - 0.07 K/uL Final   Performed at [facility], [address] [place], [place] [address]  . Sodium 04/17/2019 138  135 - 145  mmol/L Final  . Potassium 04/17/2019 3.9  3.5 - 5.1 mmol/L Final  . Chloride 04/17/2019 104  98 - 111 mmol/L Final  . CO2 04/17/2019 22  22 - 32 mmol/L Final  . Glucose, Bld 04/17/2019 89  70 - 99 mg/dL Final  . BUN 04/17/2019 12  6 - 20 mg/dL Final  . Creatinine, Ser 04/17/2019 0.62  0.44 - 1.00 mg/dL Final  . Calcium 04/17/2019 9.3  8.9 - 10.3 mg/dL Final  . Total Protein 04/17/2019 8.2* 6.5 - 8.1 g/dL Final  . Albumin 04/17/2019 4.3  3.5 - 5.0 g/dL Final  . AST 04/17/2019 23  15 - 41 U/L Final  . ALT 04/17/2019 23  0 - 44 U/L Final  . Alkaline Phosphatase 04/17/2019 69  38 - 126 U/L Final  . Total Bilirubin 04/17/2019 0.5  0.3 - 1.2 mg/dL Final  . GFR calc non Af Amer 04/17/2019 >60  >60 mL/min Final  . GFR calc Af Amer 04/17/2019 >60  >60 mL/min Final  . Anion gap 04/17/2019 12  5 - 15 Final   Performed at [facility], [address] [place], [place] [address]  . Color, Urine 04/17/2019 STRAW* YELLOW Final  . APPearance 04/17/2019 CLEAR* CLEAR Final  . Specific Gravity, Urine 04/17/2019 1.010  1.005 - 1.030 Final  . pH 04/17/2019 5.0  5.0 - 8.0  Final  . Glucose, UA 04/17/2019 NEGATIVE  NEGATIVE mg/dL Final  . Hgb urine dipstick 04/17/2019 MODERATE* NEGATIVE Final  . Bilirubin Urine 04/17/2019 NEGATIVE  NEGATIVE Final  . Ketones, ur 04/17/2019 NEGATIVE  NEGATIVE mg/dL Final  . Protein, ur 11/91/478207/03/2019 NEGATIVE  NEGATIVE mg/dL Final  . Nitrite 95/62/130807/03/2019 NEGATIVE  NEGATIVE Final  . Glori LuisLeukocytes,Ua 04/17/2019 NEGATIVE  NEGATIVE Final  . RBC / HPF 04/17/2019 0-5  0 - 5 RBC/hpf Final  . WBC, UA 04/17/2019 0-5  0 - 5 WBC/hpf Final  . Bacteria, UA 04/17/2019 RARE* NONE SEEN Final  . Squamous Epithelial / LPF 04/17/2019 0-5  0 - 5 Final  . Mucus 04/17/2019 PRESENT   Final   Performed at [facility], [address] [place], [place] [address]  . Preg Test, Ur 04/17/2019 NEGATIVE  NEGATIVE Final   Comment:        THE SENSITIVITY OF THIS METHODOLOGY IS >24 mIU/mL      Assessment: 33 y.o. s/p laparoscopic ovarian cystectomy stable  Plan: Patient has done well after surgery with no apparent complications.  I have discussed the post-operative course to date, and the expected progress moving forward.  The patient understands what complications to be concerned about.  I will see the patient in routine follow up, or sooner if needed.    Activity plan: No restriction.  Follow up 1 year Counseled against hysterecotmy   Vena AustriaAndreas Dellanira Dillow, MD, Merlinda FrederickFACOG [facility], [facility] 05/29/2019, 3:20 PM

## 2019-06-13 ENCOUNTER — Encounter: Payer: Self-pay | Admitting: Nurse Practitioner

## 2019-06-16 ENCOUNTER — Ambulatory Visit (INDEPENDENT_AMBULATORY_CARE_PROVIDER_SITE_OTHER): Payer: Self-pay | Admitting: Nurse Practitioner

## 2019-06-16 ENCOUNTER — Other Ambulatory Visit: Payer: Self-pay

## 2019-06-16 DIAGNOSIS — F5104 Psychophysiologic insomnia: Secondary | ICD-10-CM

## 2019-06-16 MED ORDER — TRAZODONE HCL 50 MG PO TABS: 25.0000 mg | ORAL_TABLET | Freq: Every evening | ORAL | 3 refills | Status: DC | PRN

## 2019-06-16 NOTE — Progress Notes (Signed)
Telemedicine Encounter: Disclosed to patient at start of encounter that we will provide appropriate telemedicine services.  Patient consents to be treated via phone prior to discussion. - Patient is at her home and is accessed via telephone. - Services are provided by Wilhelmina McardleLauren Samanyu Tinnell from [facility].  Subjective:    Patient ID: Stacey Huynh, female    DOB: [birth year], 33 y.o.   MRN: [id number]  Stacey Huynh is a 33 y.o. female presenting on 06/16/2019 for Insomnia (recurent worsening, patient reports she has tried OTC medications and reports no help.)  HPI Insomnia Patient has most difficulty with waking up too early/staying asleep.  She has no problems with falling asleep.  Patient usually goes to bed 9pm and is awake again at 10:30.  Stays awake until 5.  Then awake for work at [employer]6am.  Patient used to be on anxiety meds (benzo likely) and patient stopped taking this due to the frequency of drug testing required by her former prescriber.   - Continues to have mild anxiety and racing thoughts when she wakes up. - Patient has tried sleep hygiene strategies, OTC sleep aids, Melatonin without improvement.   Dep[facility] 06/16/2019 12/08/2018 03/24/2018  Decreased Interest 0 0 0  Down, Depressed, Hopeless 0 0 0  PHQ - 2 Score 0 0 0  Altered sleeping 3 - -  Tired, decreased energy 1 - -  Change in appetite 0 - -  Feeling bad or failure about yourself  0 - -  Trouble concentrating 0 - -  Moving slowly or fidgety/restless 0 - -  Suicidal thoughts 0 - -  PHQ-9 Score 4 - -  Difficult doing work/chores Not difficult at all - -     Social History   Tobacco Use  . Smoking status: Never Smoker  . Smokeless tobacco: Never Used  Substance Use Topics  . Alcohol use: Yes    Comment: rare  . Drug use: No    Review of Systems Per HPI unless specifically indicated above     Objective:    There were no vitals taken for this visit.  Wt Readings from Last 3  Encounters:  05/29/19 213 lb (96.6 kg)  04/27/19 209 lb (94.8 kg)  04/17/19 210 lb (95.3 kg)    Physical Exam Patient remotely monitored.  Verbal communication appropriate.  Cognition normal.  Results for orders placed or performed during the hospital encounter of 04/17/19  CBC with Differential  Result Value Ref Range   WBC 12.7 (H) 4.0 - 10.5 K/uL   RBC 5.09 3.87 - 5.11 MIL/uL   Hemoglobin 15.7 (H) 12.0 - 15.0 g/dL   HCT 32.447.2 (H) 40.136.0 - 02.746.0 %   MCV 92.7 80.0 - 100.0 fL   MCH 30.8 26.0 - 34.0 pg   MCHC 33.3 30.0 - 36.0 g/dL   RDW 25.311.9 66.411.5 - 40.315.5 %   Platelets 377 150 - 400 K/uL   nRBC 0.0 0.0 - 0.2 %   Neutrophils Relative % 63 %   Neutro Abs 8.1 (H) 1.7 - 7.7 K/uL   Lymphocytes Relative 29 %   Lymphs Abs 3.6 0.7 - 4.0 K/uL   Monocytes Relative 5 %   Monocytes Absolute 0.6 0.1 - 1.0 K/uL   Eosinophils Relative 2 %   Eosinophils Absolute 0.3 0.0 - 0.5 K/uL   Basophils Relative 1 %   Basophils Absolute 0.1 0.0 - 0.1 K/uL   Immature Granulocytes 0 %   Abs Immature  Granulocytes 0.03 0.00 - 0.07 K/uL  Comprehensive metabolic panel  Result Value Ref Range   Sodium 138 135 - 145 mmol/L   Potassium 3.9 3.5 - 5.1 mmol/L   Chloride 104 98 - 111 mmol/L   CO2 22 22 - 32 mmol/L   Glucose, Bld 89 70 - 99 mg/dL   BUN 12 6 - 20 mg/dL   Creatinine, Ser 0.62 0.44 - 1.00 mg/dL   Calcium 9.3 8.9 - 10.3 mg/dL   Total Protein 8.2 (H) 6.5 - 8.1 g/dL   Albumin 4.3 3.5 - 5.0 g/dL   AST 23 15 - 41 U/L   ALT 23 0 - 44 U/L   Alkaline Phosphatase 69 38 - 126 U/L   Total Bilirubin 0.5 0.3 - 1.2 mg/dL   GFR calc non Af Amer >60 >60 mL/min   GFR calc Af Amer >60 >60 mL/min   Anion gap 12 5 - 15  Urinalysis, Complete w Microscopic  Result Value Ref Range   Color, Urine STRAW (A) YELLOW   APPearance CLEAR (A) CLEAR   Specific Gravity, Urine 1.010 1.005 - 1.030   pH 5.0 5.0 - 8.0   Glucose, UA NEGATIVE NEGATIVE mg/dL   Hgb urine dipstick MODERATE (A) NEGATIVE   Bilirubin Urine NEGATIVE  NEGATIVE   Ketones, ur NEGATIVE NEGATIVE mg/dL   Protein, ur NEGATIVE NEGATIVE mg/dL   Nitrite NEGATIVE NEGATIVE   Leukocytes,Ua NEGATIVE NEGATIVE   RBC / HPF 0-5 0 - 5 RBC/hpf   WBC, UA 0-5 0 - 5 WBC/hpf   Bacteria, UA RARE (A) NONE SEEN   Squamous Epithelial / LPF 0-5 0 - 5   Mucus PRESENT   Pregnancy, urine POC  Result Value Ref Range   Preg Test, Ur NEGATIVE NEGATIVE      Assessment & Plan:   Problem List Items Addressed This Visit    None    Visit Diagnoses    Psychophysiological insomnia    -  Primary   Relevant Medications   traZODone (DESYREL) 50 MG tablet      Patient with uncontrolled pan-cycle sleep disturbance.  Adequate sleep hygiene work with use of OTC sleep aids, including 2 week course melatonin are not helping.  Plan: 1. START trazodone 25-50 mg at bedtime prn sleep 2. Continue use of good sleep hygiene strategies. 3. Follow-up 6 weeks reassess meds. Consider future sleep study if no improvement on meds.  Meds ordered this encounter  Medications  . traZODone (DESYREL) 50 MG tablet    Sig: Take 0.5-1 tablets (25-50 mg total) by mouth at bedtime as needed for sleep.    Dispense:  30 tablet    Refill:  3    Order Specific Question:   Supervising Provider    Answer:   Olin Hauser [[id number]]    - Time spent in direct consultation with patient via telemedicine about above concerns: 6 minutes  Follow up plan: PRN 4-6 weeks no improvement AND in 6 months  Cassell Smiles, DNP, AGPCNP-BC Adult Gerontology Primary Care Nurse Practitioner [facility] 06/16/2019, 8:15 AM

## 2019-06-23 ENCOUNTER — Encounter: Payer: Self-pay | Admitting: Nurse Practitioner

## 2019-07-08 ENCOUNTER — Other Ambulatory Visit: Payer: Self-pay | Admitting: Nurse Practitioner

## 2019-07-08 DIAGNOSIS — F5104 Psychophysiologic insomnia: Secondary | ICD-10-CM

## 2019-08-01 DIAGNOSIS — J3089 Other allergic rhinitis: Secondary | ICD-10-CM | POA: Diagnosis not present

## 2019-08-01 DIAGNOSIS — J3081 Allergic rhinitis due to animal (cat) (dog) hair and dander: Secondary | ICD-10-CM | POA: Diagnosis not present

## 2019-08-01 DIAGNOSIS — J301 Allergic rhinitis due to pollen: Secondary | ICD-10-CM | POA: Diagnosis not present

## 2019-08-01 DIAGNOSIS — H1045 Other chronic allergic conjunctivitis: Secondary | ICD-10-CM | POA: Diagnosis not present

## 2019-09-09 ENCOUNTER — Other Ambulatory Visit: Payer: Self-pay | Admitting: Nurse Practitioner

## 2019-09-09 DIAGNOSIS — F5104 Psychophysiologic insomnia: Secondary | ICD-10-CM

## 2019-09-29 ENCOUNTER — Other Ambulatory Visit: Payer: Self-pay | Admitting: Nurse Practitioner

## 2019-09-29 DIAGNOSIS — F5104 Psychophysiologic insomnia: Secondary | ICD-10-CM

## 2019-09-29 NOTE — Telephone Encounter (Signed)
Should not be out for another 2 weeks. Will leave this for Dr. Marthann Schiller return

## 2019-11-07 ENCOUNTER — Other Ambulatory Visit: Payer: Self-pay | Admitting: Nurse Practitioner

## 2019-11-07 DIAGNOSIS — I10 Essential (primary) hypertension: Secondary | ICD-10-CM

## 2019-11-09 NOTE — Telephone Encounter (Signed)
Left message for patient to call office to make appt.  

## 2019-12-01 ENCOUNTER — Ambulatory Visit (INDEPENDENT_AMBULATORY_CARE_PROVIDER_SITE_OTHER): Payer: BC Managed Care – PPO | Admitting: Obstetrics and Gynecology

## 2019-12-01 ENCOUNTER — Encounter: Payer: Self-pay | Admitting: Obstetrics and Gynecology

## 2019-12-01 ENCOUNTER — Other Ambulatory Visit: Payer: Self-pay

## 2019-12-01 VITALS — BP 108/68 | Ht 66.0 in | Wt 217.0 lb

## 2019-12-01 DIAGNOSIS — I10 Essential (primary) hypertension: Secondary | ICD-10-CM | POA: Diagnosis not present

## 2019-12-01 DIAGNOSIS — N939 Abnormal uterine and vaginal bleeding, unspecified: Secondary | ICD-10-CM

## 2019-12-01 DIAGNOSIS — N938 Other specified abnormal uterine and vaginal bleeding: Secondary | ICD-10-CM | POA: Diagnosis not present

## 2019-12-01 NOTE — Progress Notes (Signed)
Gynecology Pelvic Pain Evaluation   Chief Complaint:  Chief Complaint  Patient presents with  . Follow-up    Irregular bleeding, cramping    History of Present Illness:   Patient is a 34 y.o. G1P1001 who LMP was No LMP recorded. (Menstrual status: Oral contraceptives)., presents today for a problem visit.  She irregular and prolonged bleeding on slynd.  She is limited in regard to hormonal contraception given personal history of hypertension.  She has previously attempted a Mirena IUD unsuccessfully.     Previous evaluation: ultrasound 04/27/2019 showing normal uterus and endometrium  Pap smear 81/2019 NIL HPV negative  Laparoscopy 04/13/2019 for right ovarian cyst showing small paratubal cyst and follicular cyst of the right ovary.  Otherwise normal findings.  Review of Systems: Review of Systems  Constitutional: Negative for chills and fever.  HENT: Negative for congestion.   Respiratory: Negative for cough and shortness of breath.   Cardiovascular: Negative for chest pain and palpitations.  Gastrointestinal: Negative for abdominal pain, constipation, diarrhea, heartburn, nausea and vomiting.  Genitourinary: Negative for dysuria, frequency and urgency.  Skin: Negative for itching and rash.  Neurological: Negative for dizziness and headaches.  Endo/Heme/Allergies: Negative for polydipsia.  Psychiatric/Behavioral: Negative for depression.    Past Medical History:  Past Medical History:  Diagnosis Date  . Abnormal Pap smear   . Abnormal Pap smear of cervix   . Anxiety     Past Surgical History:  Past Surgical History:  Procedure Laterality Date  . LAPAROSCOPIC OVARIAN CYSTECTOMY Right 04/13/2019   Procedure: LAPAROSCOPIC RIGHT OVARIAN CYSTECTOMY and removal of right paratubal cyst;  Surgeon: Malachy Mood, MD;  Location: [facility] ORS;  Service: Gynecology;  Laterality: Right;  . LEEP    . WISDOM TOOTH EXTRACTION      Gynecologic History:  No LMP recorded. (Menstrual  status: Oral contraceptives).  Obstetric History: G1P1001  Family History:  Family History  Problem Relation Age of Onset  . Pancreatic cancer Paternal Aunt   . Bone cancer Maternal Grandmother   . Lung cancer Maternal Grandmother   . Colon cancer Paternal Grandfather   . Depression Mother   . Depression Father   . Suicidality Father     Social History:  Social History   Socioeconomic History  . Marital status: Married    Spouse name: Not on file  . Number of children: Not on file  . Years of education: Not on file  . Highest education level: Not on file  Occupational History  . Not on file  Tobacco Use  . Smoking status: Never Smoker  . Smokeless tobacco: Never Used  Substance and Sexual Activity  . Alcohol use: Yes    Comment: rare  . Drug use: No  . Sexual activity: Yes    Birth control/protection: Pill  Other Topics Concern  . Not on file  Social History Narrative  . Not on file   Social Determinants of Health   Financial Resource Strain:   . Difficulty of Paying Living Expenses: Not on file  Food Insecurity:   . Worried About Charity fundraiser in the Last Year: Not on file  . Ran Out of Food in the Last Year: Not on file  Transportation Needs:   . Lack of Transportation (Medical): Not on file  . Lack of Transportation (Non-Medical): Not on file  Physical Activity:   . Days of Exercise per Week: Not on file  . Minutes of Exercise per Session: Not on file  Stress:   .  Feeling of Stress : Not on file  Social Connections:   . Frequency of Communication with Friends and Family: Not on file  . Frequency of Social Gatherings with Friends and Family: Not on file  . Attends Religious Services: Not on file  . Active Member of Clubs or Organizations: Not on file  . Attends Banker Meetings: Not on file  . Marital Status: Not on file  Intimate Partner Violence:   . Fear of Current or Ex-Partner: Not on file  . Emotionally Abused: Not on file    . Physically Abused: Not on file  . Sexually Abused: Not on file    Allergies:  Allergies  Allergen Reactions  . Penicillins Other (See Comments)    Did it involve swelling of the face/tongue/throat, SOB, or low BP? Unknown Did it involve sudden or severe rash/hives, skin peeling, or any reaction on the inside of your mouth or nose? Unknown Did you need to seek medical attention at a hospital or doctor's office? Unknown When did it last happen? Childhood allergy If all above answers are "NO", may proceed with cephalosporin use.      Medications: Prior to Admission medications   Medication Sig Start Date End Date Taking? Authorizing Provider  Drospirenone (SLYND) 4 MG TABS Take 1 tablet by mouth daily. 04/27/19  Yes Vena Austria, MD  ibuprofen (ADVIL) 200 MG tablet Take 800 mg by mouth every 6 (six) hours as needed.   Yes [provider]  lisinopril (ZESTRIL) 10 MG tablet TAKE 1 TABLET BY MOUTH EVERY DAY 11/08/19  Yes Karamalegos, Netta Neat, DO  traZODone (DESYREL) 50 MG tablet TAKE 0.5-1 TABLETS (25-50 MG TOTAL) BY MOUTH AT BEDTIME AS NEEDED FOR SLEEP. 10/02/19  Yes Karamalegos, Netta Neat, DO    Physical Exam Vitals: Blood pressure 108/68, height 5\' 6"  (1.676 m), weight 217 lb (98.4 kg).  General: NAD, well nourished, appears stated age HEENT: normocephalic, anicteric Pulmonary: No increased work of breathing Neurologic: Grossly intact Psychiatric: mood appropriate, affect full  Assessment: 34 y.o. G1P1001 with abnormal uterine bleeding  Problem List Items Addressed This Visit    None    Visit Diagnoses    Abnormal uterine bleeding    -  Primary       1) Has failed Mirena IUD and po progestin - will  post TLH, BS, cystoscopy bleeding through slynd,  Has also tried and failed depo, IUD.  Contraindication to estrogen with HTN.  Failure rate of ablation at her age discussed  2) A total of 15 minutes were spent in face-to-face contact with the patient  during this encounter with over half of that time devoted to counseling and coordination of care.  3)  Return if symptoms worsen or fail to improve.   20, MD, Vena Austria [facility], [facility] 12/01/2019, 10:11 AM

## 2019-12-04 ENCOUNTER — Telehealth: Payer: Self-pay | Admitting: Obstetrics and Gynecology

## 2019-12-04 NOTE — Telephone Encounter (Signed)
Left message for pt to return call.

## 2019-12-04 NOTE — Telephone Encounter (Signed)
Pt returned call. Was wanting to know when her surgery was going to be scheduled for work planning. Adv pt that I did not have a request for scheduling surgery and would contact Dr. Bonney Aid and ask that he place the request.

## 2019-12-05 NOTE — Telephone Encounter (Signed)
Advise

## 2019-12-05 NOTE — Telephone Encounter (Signed)
Patient called trying to see if there was any more information I could give her regarding a projected DOS due to pressure from work. I did adv that I was currently scheduling surgeries in April.   Waiting on surgery scheduling request from Dr. Bonney Aid.

## 2019-12-06 ENCOUNTER — Telehealth: Payer: Self-pay | Admitting: Obstetrics and Gynecology

## 2019-12-06 NOTE — Telephone Encounter (Addendum)
Contacted pt to sch surgery  DOS 4/15 with Dr Bonney Aid  H&P 3/29 @ 8:10am  Covid testing 4/13, [address], drive up and wear mask. Adv pt of need to quar after tested until DOS

## 2019-12-08 ENCOUNTER — Encounter: Payer: Self-pay | Admitting: Emergency Medicine

## 2019-12-08 ENCOUNTER — Ambulatory Visit
Admission: EM | Admit: 2019-12-08 | Discharge: 2019-12-08 | Disposition: A | Discharge status: Home / Self Care | Payer: BLUE CROSS/BLUE SHIELD | Attending: Family Medicine | Admitting: Family Medicine

## 2019-12-08 ENCOUNTER — Other Ambulatory Visit: Payer: Self-pay

## 2019-12-08 DIAGNOSIS — R220 Localized swelling, mass and lump, head: Secondary | ICD-10-CM | POA: Diagnosis not present

## 2019-12-08 MED ORDER — PREDNISONE 10 MG (21) PO TBPK: ORAL_TABLET | Freq: Every day | ORAL | 0 refills | Status: DC

## 2019-12-08 MED ORDER — VALACYCLOVIR HCL 1 G PO TABS: 1000.0000 mg | ORAL_TABLET | Freq: Two times a day (BID) | ORAL | 0 refills | Status: AC

## 2019-12-08 NOTE — Discharge Instructions (Signed)
Please stop your lisinopril for the time being until we see further how your lips manifest.  Follow up with your primary care provider for recheck of your bp and symptoms in the next two weeks.  Due to the burning sensation and some of what I'm seeing to your lips we will treat for a cold sore as well with valtrex.  Complete course of prednisone which should help with swelling.  May use benadryl.  If worsening- tongue swelling or itching, difficulty swallowing, or wheezing, shortness of breath, please return to be seen.

## 2019-12-08 NOTE — ED Provider Notes (Signed)
[facility]    CSN: [id number] Arrival date & time: 12/08/19  1618      History   Chief Complaint Chief Complaint  Patient presents with  . Oral Swelling    lips    HPI JOHNITA PALLESCHI is a 34 y.o. female.   Letitia Neri presents with complaints of lip swelling which started this morning. She first noted burning sensation to left lower lip and swelling, followed by some swelling to the right lower lip. Now noted upper lip swelling. No tongue swelling. No wheezing or shortness of breath . No difficulty breathing. Denies any known exposures to any new products- makeup, creams, soaps, hand products. Denies any previous similar. She does take lisinopril, this is not a new medication for her.     ROS per HPI, negative if not otherwise mentioned.      Past Medical History:  Diagnosis Date  . Abnormal Pap smear   . Abnormal Pap smear of cervix   . Anxiety     Patient Active Problem List   Diagnosis Date Noted  . Essential hypertension 03/25/2018    Past Surgical History:  Procedure Laterality Date  . LAPAROSCOPIC OVARIAN CYSTECTOMY Right 04/13/2019   Procedure: LAPAROSCOPIC RIGHT OVARIAN CYSTECTOMY and removal of right paratubal cyst;  Surgeon: Vena Austria, MD;  Location: [facility] ORS;  Service: Gynecology;  Laterality: Right;  . LEEP    . WISDOM TOOTH EXTRACTION      OB History    Gravida  1   Para  1   Term  1   Preterm      AB      Living  1     SAB      TAB      Ectopic      Multiple      Live Births  1            Home Medications    Prior to Admission medications   Medication Sig Start Date End Date Taking? Authorizing Provider  Drospirenone (SLYND) 4 MG TABS Take 1 tablet by mouth daily. 04/27/19  Yes Vena Austria, MD  ibuprofen (ADVIL) 200 MG tablet Take 800 mg by mouth every 6 (six) hours as needed.   Yes [provider]  lisinopril (ZESTRIL) 10 MG tablet TAKE 1 TABLET BY MOUTH EVERY DAY 11/08/19   Yes Karamalegos, Netta Neat, DO  traZODone (DESYREL) 50 MG tablet TAKE 0.5-1 TABLETS (25-50 MG TOTAL) BY MOUTH AT BEDTIME AS NEEDED FOR SLEEP. 10/02/19  Yes Karamalegos, Netta Neat, DO  predniSONE (STERAPRED UNI-PAK 21 TAB) 10 MG (21) TBPK tablet Take by mouth daily. Per box instruction 12/08/19   Georgetta Haber, NP  valACYclovir (VALTREX) 1000 MG tablet Take 1 tablet (1,000 mg total) by mouth 2 (two) times daily for 7 days. 12/08/19 12/15/19  Georgetta Haber, NP    Family History Family History  Problem Relation Age of Onset  . Pancreatic cancer Paternal Aunt   . Bone cancer Maternal Grandmother   . Lung cancer Maternal Grandmother   . Colon cancer Paternal Grandfather   . Depression Mother   . Depression Father   . Suicidality Father     Social History Social History   Tobacco Use  . Smoking status: Never Smoker  . Smokeless tobacco: Never Used  Substance Use Topics  . Alcohol use: Yes    Comment: rare  . Drug use: No     Allergies   Penicillins   Review  of Systems Review of Systems   Physical Exam Triage Vital Signs ED Triage Vitals  Enc Vitals Group     BP 12/08/19 1634 120/79     Pulse Rate 12/08/19 1634 83     Resp 12/08/19 1634 14     Temp 12/08/19 1634 99 F (37.2 C)     Temp Source 12/08/19 1634 Oral     SpO2 12/08/19 1634 99 %     Weight 12/08/19 1631 215 lb (97.5 kg)     Height 12/08/19 1631 5\' 6"  (1.676 m)     Head Circumference --      Peak Flow --      Pain Score 12/08/19 1631 0     Pain Loc --      Pain Edu? --      Excl. in GC? --    No data found.  Updated Vital Signs BP 120/79 (BP Location: Right Arm)   Pulse 83   Temp 99 F (37.2 C) (Oral)   Resp 14   Ht 5\' 6"  (1.676 m)   Wt 215 lb (97.5 kg)   LMP 11/20/2019 (Approximate)   SpO2 99%   BMI 34.70 kg/m    Physical Exam Constitutional:      General: She is not in acute distress.    Appearance: She is well-developed.  HENT:     Mouth/Throat:     Mouth: Mucous membranes  are moist.     Pharynx: No oropharyngeal exudate or posterior oropharyngeal erythema.     Comments: Upper lip with swelling noted with very fine appearing clear/white vesicles scattered to upper lip; on palpation patient states sensation off feeling "chapped" Cardiovascular:     Rate and Rhythm: Normal rate.  Pulmonary:     Effort: Pulmonary effort is normal. No respiratory distress.  Skin:    General: Skin is warm and dry.  Neurological:     Mental Status: She is alert and oriented to person, place, and time.      UC Treatments / Results  Labs (all labs ordered are listed, but only abnormal results are displayed) Labs Reviewed - No data to display  EKG   Radiology No results found.  Procedures Procedures (including critical care time)  Medications Ordered in UC Medications - No data to display  Initial Impression / Assessment and Plan / UC Course  I have reviewed the triage vital signs and the nursing notes.  Pertinent labs & imaging results that were available during my care of the patient were reviewed by me and considered in my medical decision making (see chart for details).     Upper lip with greater swelling than lower lip. No tongue or mouth involvement, no wheezing or shortness of breath . No known exposure to any new products. Allergic response vs ace inhibitor angioedema vs hsv-1 discussed and considered. Did have a burning sensation at time of onset. Opted to cover with valtrex as well as prednisone. bp looks great today, hold lisinopril for now and see how symptoms improve or progress. Follow up with pcp next week for bp recheck and to consider if ok to restart pending how lips improved or if lesion further developed and was more obvious hsv-1. Return precautions provided. Patient verbalized understanding and agreeable to plan.   Final Clinical Impressions(s) / UC Diagnoses   Final diagnoses:  Lip swelling     Discharge Instructions     Please stop your  lisinopril for the time being until we see further how  your lips manifest.  Follow up with your primary care provider for recheck of your bp and symptoms in the next two weeks.  Due to the burning sensation and some of what I'm seeing to your lips we will treat for a cold sore as well with valtrex.  Complete course of prednisone which should help with swelling.  May use benadryl.  If worsening- tongue swelling or itching, difficulty swallowing, or wheezing, shortness of breath, please return to be seen.    ED Prescriptions    Medication Sig Dispense Auth. Provider   predniSONE (STERAPRED UNI-PAK 21 TAB) 10 MG (21) TBPK tablet Take by mouth daily. Per box instruction 21 tablet , Lanelle Bal B, NP   valACYclovir (VALTREX) 1000 MG tablet Take 1 tablet (1,000 mg total) by mouth 2 (two) times daily for 7 days. 14 tablet Zigmund Gottron, NP     PDMP not reviewed this encounter.   Zigmund Gottron, NP 12/08/19 1818

## 2019-12-08 NOTE — ED Triage Notes (Signed)
Patient states that she noticed her lips swelling this morning.  Patient states that she takes Lisinopril.  Patient states that she has taken it for over a year.  Patient states that she took Benadryl today at 12:30 and Ibuprofen today.  Patient denies tongue swelling or difficulty swallowing.  Patient denies SOB or difficulty breathing.

## 2020-01-08 ENCOUNTER — Other Ambulatory Visit: Payer: Self-pay

## 2020-01-08 ENCOUNTER — Ambulatory Visit (INDEPENDENT_AMBULATORY_CARE_PROVIDER_SITE_OTHER): Payer: BC Managed Care – PPO | Admitting: Obstetrics and Gynecology

## 2020-01-08 ENCOUNTER — Encounter: Payer: Self-pay | Admitting: Obstetrics and Gynecology

## 2020-01-08 VITALS — BP 124/80 | HR 79 | Ht 66.0 in | Wt 219.0 lb

## 2020-01-08 DIAGNOSIS — R102 Pelvic and perineal pain: Secondary | ICD-10-CM

## 2020-01-08 DIAGNOSIS — N939 Abnormal uterine and vaginal bleeding, unspecified: Secondary | ICD-10-CM | POA: Diagnosis not present

## 2020-01-08 DIAGNOSIS — Z01818 Encounter for other preprocedural examination: Secondary | ICD-10-CM | POA: Diagnosis not present

## 2020-01-08 NOTE — Progress Notes (Signed)
Obstetrics & Gynecology Surgery H&P    Chief Complaint: Scheduled Surgery   History of Present Illness: Patient is a 34 y.o. G1P1001 presenting for scheduled TLH, BS, cystoscopy, for the treatment of menorrhagia failing medical management and pelvic pain.   Prior Treatments prior to proceeding with surgery include: hormonal management, ultrasound evalution  Preoperative Pap: 8/1/20218 Results: no abnormalities HPV negative Preoperative Endometrial biopsy: N/A 1.60mm endometrium on last ultrasound Preoperative Ultrasound: 04/27/2019 normal     Review of Systems:10 point review of systems  Past Medical History:  Patient Active Problem List   Diagnosis Date Noted  . Essential hypertension 03/25/2018    Past Surgical History:  Past Surgical History:  Procedure Laterality Date  . LAPAROSCOPIC OVARIAN CYSTECTOMY Right 04/13/2019   Procedure: LAPAROSCOPIC RIGHT OVARIAN CYSTECTOMY and removal of right paratubal cyst;  Surgeon: Vena Austria, MD;  Location: [facility] ORS;  Service: Gynecology;  Laterality: Right;  . LEEP    . WISDOM TOOTH EXTRACTION      Family History:  Family History  Problem Relation Age of Onset  . Pancreatic cancer Paternal Aunt   . Bone cancer Maternal Grandmother   . Lung cancer Maternal Grandmother   . Colon cancer Paternal Grandfather   . Depression Mother   . Depression Father   . Suicidality Father     Social History:  Social History   Socioeconomic History  . Marital status: Married    Spouse name: Not on file  . Number of children: Not on file  . Years of education: Not on file  . Highest education level: Not on file  Occupational History  . Not on file  Tobacco Use  . Smoking status: Never Smoker  . Smokeless tobacco: Never Used  Substance and Sexual Activity  . Alcohol use: Yes    Comment: rare  . Drug use: No  . Sexual activity: Yes    Birth control/protection: Pill  Other Topics Concern  . Not on file  Social History Narrative    . Not on file   Social Determinants of Health   Financial Resource Strain:   . Difficulty of Paying Living Expenses:   Food Insecurity:   . Worried About Programme researcher, broadcasting/film/video in the Last Year:   . Barista in the Last Year:   Transportation Needs:   . Freight forwarder (Medical):   Marland Kitchen Lack of Transportation (Non-Medical):   Physical Activity:   . Days of Exercise per Week:   . Minutes of Exercise per Session:   Stress:   . Feeling of Stress :   Social Connections:   . Frequency of Communication with Friends and Family:   . Frequency of Social Gatherings with Friends and Family:   . Attends Religious Services:   . Active Member of Clubs or Organizations:   . Attends Banker Meetings:   Marland Kitchen Marital Status:   Intimate Partner Violence:   . Fear of Current or Ex-Partner:   . Emotionally Abused:   Marland Kitchen Physically Abused:   . Sexually Abused:     Allergies:  Allergies  Allergen Reactions  . Penicillins Other (See Comments)    Did it involve swelling of the face/tongue/throat, SOB, or low BP? Unknown Did it involve sudden or severe rash/hives, skin peeling, or any reaction on the inside of your mouth or nose? Unknown Did you need to seek medical attention at a hospital or doctor's office? Unknown When did it last happen? Childhood allergy If all  above answers are "NO", may proceed with cephalosporin use.      Medications: Prior to Admission medications   Medication Sig Start Date End Date Taking? Authorizing Provider  Drospirenone (SLYND) 4 MG TABS Take 1 tablet by mouth daily. Patient taking differently: Take 4 mg by mouth at bedtime.  04/27/19  Yes Malachy Mood, MD  fexofenadine (ALLEGRA) 180 MG tablet Take 180 mg by mouth daily.   Yes [provider]  lisinopril (ZESTRIL) 10 MG tablet TAKE 1 TABLET BY MOUTH EVERY DAY Patient taking differently: Take 10 mg by mouth daily.  11/08/19  Yes Karamalegos, Devonne Doughty, DO  traZODone (DESYREL) 50  MG tablet TAKE 0.5-1 TABLETS (25-50 MG TOTAL) BY MOUTH AT BEDTIME AS NEEDED FOR SLEEP. Patient taking differently: Take 50 mg by mouth at bedtime.  10/02/19  Yes Karamalegos, Devonne Doughty, DO    Physical Exam Vitals: Blood pressure 124/80, pulse 79, height 5\' 6"  (1.676 m), weight 219 lb (99.3 kg). Body mass index is 35.35 kg/m.  General: NAD HEENT: normocephalic, anicteric Pulmonary: No increased work of breathing, CTAB Cardiovascular: RRR, distal pulses 2+ Abdomen: soft, non-tender, non-distended Extremities: no edema, erythema, or tenderness Neurologic: Grossly intact Psychiatric: mood appropriate, affect full  Imaging No results found.  Assessment: 34 y.o. G1P1001 presenting for scheduled TLH, BS, cystosocopy  Plan: 1) Patient opts for definitive surgical management via hysterectomy. The risks of surgery were discussed in detail with the patient including but not limited to: bleeding which may require transfusion or reoperation; infection which may require antibiotics; injury to bowel, bladder, ureters or other surrounding organs (With a literature reported rate of urinary tract injury of 1% quoted); need for additional procedures including laparotomy; thromboembolic phenomenon, incisional problems and other postoperative/anesthesia complications.  Patient was also advised that recovery procedure generally involves an overnight stay; and the  expected recovery time after a hysterectomy being in the range of 6-8 weeks.  Likelihood of success in alleviating the patient's symptoms was discussed.  While definitive in regards to issues with menstural bleeding, pelvic pain if present preoperatively may continue and in fact worsen postoperatively.  She is aware that the procedure will render her unable to pursue childbearing in the future.   She was told that she will be contacted by our surgical scheduler regarding the time and date of her surgery; routine preoperative instructions of having  nothing to eat or drink after midnight on the day prior to surgery and also coming to the hospital 1.5 hours prior to her time of surgery were also emphasized.  She was told she may be called for a preoperative appointment about a week prior to surgery and will be given further preoperative instructions at that visit.  Routine postoperative instructions will be reviewed with the patient and her family in detail after surgery. Printed patient education handouts about the procedure was given to the patient to review at home.  2) Routine postoperative instructions were reviewed with the patient and her family in detail today including the expected length of recovery and likely postoperative course.  The patient concurred with the proposed plan, giving informed written consent for the surgery today.  Patient instructed on the importance of being NPO after midnight prior to her procedure.  If warranted preoperative prophylactic antibiotics and SCDs ordered on call to the OR to meet SCIP guidelines and adhere to recommendation laid forth in [place] Number 104 May 2009  "Antibiotic Prophylaxis for Gynecologic Procedures".     Malachy Mood, MD, Cherlynn June  [facility], [facility] 01/08/2020, 8:13 AM

## 2020-01-09 ENCOUNTER — Other Ambulatory Visit: Payer: Self-pay

## 2020-01-09 ENCOUNTER — Encounter
Admission: RE | Admit: 2020-01-09 | Discharge: 2020-01-09 | Disposition: A | Discharge status: Home / Self Care | Payer: BC Managed Care – PPO | Source: Ambulatory Visit | Attending: Obstetrics and Gynecology | Admitting: Obstetrics and Gynecology

## 2020-01-09 DIAGNOSIS — Z01818 Encounter for other preprocedural examination: Secondary | ICD-10-CM | POA: Insufficient documentation

## 2020-01-09 HISTORY — DX: Essential (primary) hypertension: I10

## 2020-01-09 NOTE — Patient Instructions (Signed)
Your procedure is scheduled on: thurs 4/15 Report to Day Surgery. To find out your arrival time please call [phone] between 1PM - 3PM on Wed. 4/14.  Remember: Instructions that are not followed completely may result in serious medical risk,  up to and including death, or upon the discretion of your surgeon and anesthesiologist your  surgery may need to be rescheduled.     _X__ 1. Do not eat food after midnight the night before your procedure.                 No gum chewing or hard candies. You may drink clear liquids up to 2 hours                 before you are scheduled to arrive for your surgery- DO not drink clear                 liquids within 2 hours of the start of your surgery.                 Clear Liquids include:  water, apple juice without pulp, clear Gatorade, G2 or                  Gatorade Zero (avoid Red/Purple/Blue), Black Coffee or Tea (Do not add                 anything to coffee or tea). __x___2.   Complete the carbohydrate drink provided to you, 2 hours before arrival.  __X__2.  On the morning of surgery brush your teeth with toothpaste and water, you                may rinse your mouth with mouthwash if you wish.  Do not swallow any toothpaste of mouthwash.     _X__ 3.  No Alcohol for 24 hours before or after surgery.   ___ 4.  Do Not Smoke or use e-cigarettes For 24 Hours Prior to Your Surgery.                 Do not use any chewable tobacco products for at least 6 hours prior to                 surgery.  ____  5.  Bring all medications with you on the day of surgery if instructed.   __x__  6.  Notify your doctor if there is any change in your medical condition      (cold, fever, infections).     Do not wear jewelry, make-up, hairpins, clips or nail polish. Do not wear lotions, powders, or perfumes. You may wear deodorant. Do not shave 48 hours prior to surgery. Men may shave face and neck. Do not bring valuables to the hospital.     [facility] is not responsible for any belongings or valuables.  Contacts, dentures or bridgework may not be worn into surgery. Leave your suitcase in the car. After surgery it may be brought to your room. For patients admitted to the hospital, discharge time is determined by your treatment team.   Patients discharged the day of surgery will not be allowed to drive home.   Make arrangements for someone to be with you for the first 24 hours of your Same Day Discharge.    Please read over the following fact sheets that you were given:     _x___ Take these medicines the morning of surgery with A SIP OF WATER:  1. none  2.   3.   4.  5.  6.  ____ Fleet Enema (as directed)   __x__ Use CHG Soap (or wipes) as directed  ____ Use Benzoyl Peroxide Gel as instructed  ____ Use inhalers on the day of surgery  ____ Stop metformin 2 days prior to surgery    ____ Take 1/2 of usual insulin dose the night before surgery. No insulin the morning          of surgery.   ____ Stop Coumadin/Plavix/aspirin on   _x___ Stop Anti-inflammatories ibuprofen aleve or aspirin on 4/8   ____ Stop supplements until after surgery.    ____ Bring C-Pap to the hospital.

## 2020-01-12 ENCOUNTER — Other Ambulatory Visit: Payer: Self-pay | Admitting: Family Medicine

## 2020-01-12 DIAGNOSIS — F5104 Psychophysiologic insomnia: Secondary | ICD-10-CM

## 2020-01-15 ENCOUNTER — Other Ambulatory Visit: Payer: Self-pay

## 2020-01-15 ENCOUNTER — Encounter
Admission: RE | Admit: 2020-01-15 | Discharge: 2020-01-15 | Disposition: A | Discharge status: Home / Self Care | Payer: BC Managed Care – PPO | Source: Ambulatory Visit | Attending: Obstetrics and Gynecology | Admitting: Obstetrics and Gynecology

## 2020-01-15 DIAGNOSIS — Z01812 Encounter for preprocedural laboratory examination: Secondary | ICD-10-CM | POA: Insufficient documentation

## 2020-01-15 DIAGNOSIS — I10 Essential (primary) hypertension: Secondary | ICD-10-CM | POA: Diagnosis not present

## 2020-01-15 DIAGNOSIS — Z01810 Encounter for preprocedural cardiovascular examination: Secondary | ICD-10-CM | POA: Insufficient documentation

## 2020-01-15 LAB — BASIC METABOLIC PANEL
Anion gap: 8 (ref 5–15)
BUN: 13 mg/dL (ref 6–20)
CO2: 25 mmol/L (ref 22–32)
Calcium: 9 mg/dL (ref 8.9–10.3)
Chloride: 106 mmol/L (ref 98–111)
Creatinine, Ser: 0.75 mg/dL (ref 0.44–1.00)
GFR calc Af Amer: >60 mL/min (ref >60)
GFR calc non Af Amer: >60 mL/min (ref >60)
Glucose, Bld: 102 mg/dL — ABNORMAL HIGH (ref 70–99)
Potassium: 4.3 mmol/L (ref 3.5–5.1)
Sodium: 139 mmol/L (ref 135–145)

## 2020-01-15 LAB — CBC
HCT: 39.7 % (ref 36.0–46.0)
Hemoglobin: 13.2 g/dL (ref 12.0–15.0)
MCH: 30.2 pg (ref 26.0–34.0)
MCHC: 33.2 g/dL (ref 30.0–36.0)
MCV: 90.8 fL (ref 80.0–100.0)
Platelets: 347 10*3/uL (ref 150–400)
RBC: 4.37 MIL/uL (ref 3.87–5.11)
RDW: 12.7 % (ref 11.5–15.5)
WBC: 7.4 10*3/uL (ref 4.0–10.5)
nRBC: 0 % (ref 0.0–0.2)

## 2020-01-23 ENCOUNTER — Other Ambulatory Visit
Admission: RE | Admit: 2020-01-23 | Discharge: 2020-01-23 | Disposition: A | Discharge status: Home / Self Care | Payer: BC Managed Care – PPO | Source: Ambulatory Visit | Attending: Obstetrics and Gynecology | Admitting: Obstetrics and Gynecology

## 2020-01-23 ENCOUNTER — Other Ambulatory Visit: Payer: Self-pay

## 2020-01-23 DIAGNOSIS — Z01812 Encounter for preprocedural laboratory examination: Secondary | ICD-10-CM | POA: Insufficient documentation

## 2020-01-23 DIAGNOSIS — Z20822 Contact with and (suspected) exposure to covid-19: Secondary | ICD-10-CM | POA: Diagnosis not present

## 2020-01-23 LAB — SARS CORONAVIRUS 2 (TAT 6-24 HRS): SARS Coronavirus 2: NEGATIVE

## 2020-01-24 LAB — TYPE AND SCREEN
ABO/RH(D): A POS
Antibody Screen: NEGATIVE

## 2020-01-24 MED ORDER — GENTAMICIN SULFATE 40 MG/ML IJ SOLN
5.0000 mg/kg | INTRAVENOUS | Status: AC
  Administered 2020-01-25: 500 mg via INTRAVENOUS
  Filled 2020-01-24: qty 12.5

## 2020-01-24 MED ORDER — CLINDAMYCIN PHOSPHATE 900 MG/50ML IV SOLN
900.0000 mg | INTRAVENOUS | Status: AC
  Administered 2020-01-25: 900 mg via INTRAVENOUS

## 2020-01-25 ENCOUNTER — Observation Stay: Payer: BC Managed Care – PPO | Admitting: Certified Registered Nurse Anesthetist

## 2020-01-25 ENCOUNTER — Encounter: Payer: Self-pay | Admitting: Obstetrics and Gynecology

## 2020-01-25 ENCOUNTER — Encounter
Admission: RE | Disposition: A | Discharge status: Home / Self Care | Payer: Self-pay | Source: Home / Self Care | Attending: Obstetrics and Gynecology

## 2020-01-25 ENCOUNTER — Observation Stay
Admission: RE | Admit: 2020-01-25 | Discharge: 2020-01-26 | Disposition: A | Discharge status: Home / Self Care | Payer: BC Managed Care – PPO | Attending: Obstetrics and Gynecology | Admitting: Obstetrics and Gynecology

## 2020-01-25 ENCOUNTER — Other Ambulatory Visit: Payer: Self-pay

## 2020-01-25 DIAGNOSIS — I10 Essential (primary) hypertension: Secondary | ICD-10-CM | POA: Insufficient documentation

## 2020-01-25 DIAGNOSIS — R102 Pelvic and perineal pain: Secondary | ICD-10-CM | POA: Diagnosis not present

## 2020-01-25 DIAGNOSIS — N920 Excessive and frequent menstruation with regular cycle: Secondary | ICD-10-CM | POA: Diagnosis not present

## 2020-01-25 DIAGNOSIS — Z90710 Acquired absence of both cervix and uterus: Secondary | ICD-10-CM | POA: Diagnosis present

## 2020-01-25 DIAGNOSIS — N939 Abnormal uterine and vaginal bleeding, unspecified: Secondary | ICD-10-CM | POA: Diagnosis not present

## 2020-01-25 DIAGNOSIS — Z793 Long term (current) use of hormonal contraceptives: Secondary | ICD-10-CM | POA: Insufficient documentation

## 2020-01-25 HISTORY — PX: TOTAL LAPAROSCOPIC HYSTERECTOMY WITH SALPINGECTOMY: SHX6742

## 2020-01-25 HISTORY — PX: CYSTOSCOPY: SHX5120

## 2020-01-25 LAB — POCT PREGNANCY, URINE: Preg Test, Ur: NEGATIVE

## 2020-01-25 SURGERY — HYSTERECTOMY, TOTAL, LAPAROSCOPIC, WITH SALPINGECTOMY
Anesthesia: General

## 2020-01-25 MED ORDER — FENTANYL CITRATE (PF) 100 MCG/2ML IJ SOLN
INTRAMUSCULAR | Status: AC
  Administered 2020-01-25: 25 ug via INTRAVENOUS
  Filled 2020-01-25: qty 2

## 2020-01-25 MED ORDER — ONDANSETRON HCL 4 MG/2ML IJ SOLN
INTRAMUSCULAR | Status: DC | PRN
  Administered 2020-01-25: 4 mg via INTRAVENOUS

## 2020-01-25 MED ORDER — ONDANSETRON HCL 4 MG/2ML IJ SOLN: 4.0000 mg | Freq: Four times a day (QID) | INTRAMUSCULAR | Status: DC | PRN

## 2020-01-25 MED ORDER — ONDANSETRON HCL 4 MG/2ML IJ SOLN
INTRAMUSCULAR | Status: AC
  Filled 2020-01-25: qty 2

## 2020-01-25 MED ORDER — LIDOCAINE HCL (CARDIAC) PF 100 MG/5ML IV SOSY
PREFILLED_SYRINGE | INTRAVENOUS | Status: DC | PRN
  Administered 2020-01-25: 80 mg via INTRAVENOUS

## 2020-01-25 MED ORDER — CLINDAMYCIN PHOSPHATE 900 MG/50ML IV SOLN
INTRAVENOUS | Status: AC
  Filled 2020-01-25: qty 50

## 2020-01-25 MED ORDER — DEXAMETHASONE SODIUM PHOSPHATE 10 MG/ML IJ SOLN
INTRAMUSCULAR | Status: AC
  Filled 2020-01-25: qty 1

## 2020-01-25 MED ORDER — KETOROLAC TROMETHAMINE 30 MG/ML IJ SOLN
INTRAMUSCULAR | Status: AC
  Filled 2020-01-25: qty 1

## 2020-01-25 MED ORDER — BUPIVACAINE HCL (PF) 0.5 % IJ SOLN
INTRAMUSCULAR | Status: AC
  Filled 2020-01-25: qty 30

## 2020-01-25 MED ORDER — IBUPROFEN 800 MG PO TABS: 800.0000 mg | ORAL_TABLET | Freq: Four times a day (QID) | ORAL | Status: DC

## 2020-01-25 MED ORDER — FENTANYL CITRATE (PF) 100 MCG/2ML IJ SOLN
INTRAMUSCULAR | Status: DC | PRN
  Administered 2020-01-25 (×2): 50 ug via INTRAVENOUS

## 2020-01-25 MED ORDER — FAMOTIDINE 20 MG PO TABS: 20.0000 mg | ORAL_TABLET | Freq: Once | ORAL | Status: AC

## 2020-01-25 MED ORDER — KETOROLAC TROMETHAMINE 30 MG/ML IJ SOLN
30.0000 mg | Freq: Four times a day (QID) | INTRAMUSCULAR | Status: DC
  Administered 2020-01-25 – 2020-01-26 (×3): 30 mg via INTRAVENOUS
  Filled 2020-01-25 (×3): qty 1

## 2020-01-25 MED ORDER — DEXMEDETOMIDINE HCL IN NACL 80 MCG/20ML IV SOLN
INTRAVENOUS | Status: AC
  Filled 2020-01-25: qty 20

## 2020-01-25 MED ORDER — TRAZODONE HCL 50 MG PO TABS
50.0000 mg | ORAL_TABLET | Freq: Every day | ORAL | Status: DC
  Administered 2020-01-25: 21:00:00 50 mg via ORAL
  Filled 2020-01-25 (×2): qty 1

## 2020-01-25 MED ORDER — MENTHOL 3 MG MT LOZG: 1.0000 | LOZENGE | OROMUCOSAL | Status: DC | PRN

## 2020-01-25 MED ORDER — HEMOSTATIC AGENTS (NO CHARGE) OPTIME
TOPICAL | Status: DC | PRN
  Administered 2020-01-25: 1 via TOPICAL

## 2020-01-25 MED ORDER — FENTANYL CITRATE (PF) 100 MCG/2ML IJ SOLN
INTRAMUSCULAR | Status: AC
  Filled 2020-01-25: qty 2

## 2020-01-25 MED ORDER — LACTATED RINGERS IV SOLN: INTRAVENOUS | Status: DC

## 2020-01-25 MED ORDER — BUPIVACAINE HCL 0.5 % IJ SOLN
INTRAMUSCULAR | Status: DC | PRN
  Administered 2020-01-25: 17 mL

## 2020-01-25 MED ORDER — DEXAMETHASONE SODIUM PHOSPHATE 10 MG/ML IJ SOLN
INTRAMUSCULAR | Status: DC | PRN
  Administered 2020-01-25: 10 mg via INTRAVENOUS

## 2020-01-25 MED ORDER — ROCURONIUM BROMIDE 100 MG/10ML IV SOLN
INTRAVENOUS | Status: DC | PRN
  Administered 2020-01-25: 50 mg via INTRAVENOUS

## 2020-01-25 MED ORDER — OXYCODONE-ACETAMINOPHEN 5-325 MG PO TABS
1.0000 | ORAL_TABLET | ORAL | Status: DC | PRN
  Administered 2020-01-25 (×2): 2 via ORAL
  Administered 2020-01-26: 1 via ORAL
  Administered 2020-01-26: 2 via ORAL
  Administered 2020-01-26: 1 via ORAL
  Filled 2020-01-25: qty 2
  Filled 2020-01-25: qty 1
  Filled 2020-01-25 (×2): qty 2
  Filled 2020-01-25: qty 1

## 2020-01-25 MED ORDER — MIDAZOLAM HCL 2 MG/2ML IJ SOLN
INTRAMUSCULAR | Status: AC
  Filled 2020-01-25: qty 2

## 2020-01-25 MED ORDER — PROPOFOL 10 MG/ML IV BOLUS
INTRAVENOUS | Status: DC | PRN
  Administered 2020-01-25: 200 mg via INTRAVENOUS

## 2020-01-25 MED ORDER — DEXTROSE-NACL 5-0.45 % IV SOLN: INTRAVENOUS | Status: DC

## 2020-01-25 MED ORDER — PROPOFOL 10 MG/ML IV BOLUS
INTRAVENOUS | Status: AC
  Filled 2020-01-25: qty 20

## 2020-01-25 MED ORDER — KETOROLAC TROMETHAMINE 30 MG/ML IJ SOLN
INTRAMUSCULAR | Status: DC | PRN
  Administered 2020-01-25: 30 mg via INTRAVENOUS

## 2020-01-25 MED ORDER — MIDAZOLAM HCL 2 MG/2ML IJ SOLN
INTRAMUSCULAR | Status: DC | PRN
  Administered 2020-01-25: 2 mg via INTRAVENOUS

## 2020-01-25 MED ORDER — ACETAMINOPHEN 10 MG/ML IV SOLN
INTRAVENOUS | Status: AC
  Filled 2020-01-25: qty 100

## 2020-01-25 MED ORDER — ROCURONIUM BROMIDE 10 MG/ML (PF) SYRINGE
PREFILLED_SYRINGE | INTRAVENOUS | Status: AC
  Filled 2020-01-25: qty 10

## 2020-01-25 MED ORDER — ONDANSETRON HCL 4 MG PO TABS: 4.0000 mg | ORAL_TABLET | Freq: Four times a day (QID) | ORAL | Status: DC | PRN

## 2020-01-25 MED ORDER — LISINOPRIL 10 MG PO TABS
10.0000 mg | ORAL_TABLET | Freq: Every day | ORAL | Status: DC
  Administered 2020-01-25: 10 mg via ORAL
  Filled 2020-01-25 (×2): qty 1

## 2020-01-25 MED ORDER — SUGAMMADEX SODIUM 200 MG/2ML IV SOLN
INTRAVENOUS | Status: DC | PRN
  Administered 2020-01-25: 200 mg via INTRAVENOUS

## 2020-01-25 MED ORDER — DEXMEDETOMIDINE HCL 200 MCG/2ML IV SOLN
INTRAVENOUS | Status: DC | PRN
  Administered 2020-01-25: 12 ug via INTRAVENOUS

## 2020-01-25 MED ORDER — FAMOTIDINE 20 MG PO TABS
ORAL_TABLET | ORAL | Status: AC
  Administered 2020-01-25: 20 mg via ORAL
  Filled 2020-01-25: qty 1

## 2020-01-25 MED ORDER — FENTANYL CITRATE (PF) 100 MCG/2ML IJ SOLN
25.0000 ug | INTRAMUSCULAR | Status: DC | PRN
  Administered 2020-01-25 (×4): 25 ug via INTRAVENOUS

## 2020-01-25 MED ORDER — MORPHINE SULFATE (PF) 2 MG/ML IV SOLN
1.0000 mg | INTRAVENOUS | Status: DC | PRN
  Administered 2020-01-25: 2 mg via INTRAVENOUS
  Filled 2020-01-25: qty 1

## 2020-01-25 MED ORDER — ONDANSETRON HCL 4 MG/2ML IJ SOLN: 4.0000 mg | Freq: Once | INTRAMUSCULAR | Status: DC | PRN

## 2020-01-25 MED ORDER — PHENYLEPHRINE HCL (PRESSORS) 10 MG/ML IV SOLN
INTRAVENOUS | Status: DC | PRN
  Administered 2020-01-25 (×2): 200 ug via INTRAVENOUS
  Administered 2020-01-25: 100 ug via INTRAVENOUS
  Administered 2020-01-25 (×3): 200 ug via INTRAVENOUS

## 2020-01-25 MED ORDER — ACETAMINOPHEN 10 MG/ML IV SOLN
INTRAVENOUS | Status: DC | PRN
  Administered 2020-01-25: 1000 mg via INTRAVENOUS

## 2020-01-25 MED ORDER — EPHEDRINE SULFATE 50 MG/ML IJ SOLN
INTRAMUSCULAR | Status: DC | PRN
  Administered 2020-01-25: 5 mg via INTRAVENOUS

## 2020-01-25 SURGICAL SUPPLY — 56 items
APPLICATOR ARISTA FLEXITIP XL (MISCELLANEOUS) ×1 IMPLANT
BAG URINE DRAIN 2000ML AR STRL (UROLOGICAL SUPPLIES) ×2 IMPLANT
BLADE SURG SZ11 CARB STEEL (BLADE) ×2 IMPLANT
CANISTER SUCT 1200ML W/VALVE (MISCELLANEOUS) ×2 IMPLANT
CATH FOLEY 2WAY  5CC 16FR (CATHETERS) ×1
CATH URTH 16FR FL 2W BLN LF (CATHETERS) ×1 IMPLANT
CHLORAPREP W/TINT 26 (MISCELLANEOUS) ×2 IMPLANT
COVER WAND RF STERILE (DRAPES) ×2 IMPLANT
DEFOGGER SCOPE WARMER CLEARIFY (MISCELLANEOUS) ×2 IMPLANT
DERMABOND ADVANCED (GAUZE/BANDAGES/DRESSINGS) ×1
DERMABOND ADVANCED .7 DNX12 (GAUZE/BANDAGES/DRESSINGS) ×1 IMPLANT
DEVICE SUTURE ENDOST 10MM (ENDOMECHANICALS) ×2 IMPLANT
GAUZE 4X4 16PLY RFD (DISPOSABLE) ×2 IMPLANT
GLOVE BIO SURGEON STRL SZ7 (GLOVE) ×8 IMPLANT
GLOVE INDICATOR 7.5 STRL GRN (GLOVE) ×2 IMPLANT
GOWN STRL REUS W/ TWL LRG LVL3 (GOWN DISPOSABLE) ×2 IMPLANT
GOWN STRL REUS W/ TWL XL LVL3 (GOWN DISPOSABLE) ×1 IMPLANT
GOWN STRL REUS W/TWL LRG LVL3 (GOWN DISPOSABLE) ×2
GOWN STRL REUS W/TWL XL LVL3 (GOWN DISPOSABLE) ×1
GRASPER SUT TROCAR 14GX15 (MISCELLANEOUS) ×2 IMPLANT
HEMOSTAT ARISTA ABSORB 1G (HEMOSTASIS) ×1 IMPLANT
IRRIGATION STRYKERFLOW (MISCELLANEOUS) ×1 IMPLANT
IRRIGATOR STRYKERFLOW (MISCELLANEOUS) ×2
IV LACTATED RINGERS 1000ML (IV SOLUTION) ×2 IMPLANT
IV NS 1000ML (IV SOLUTION) ×1
IV NS 1000ML BAXH (IV SOLUTION) ×1 IMPLANT
KIT PINK PAD W/HEAD ARE REST (MISCELLANEOUS) ×2
KIT PINK PAD W/HEAD ARM REST (MISCELLANEOUS) ×1 IMPLANT
LABEL OR SOLS (LABEL) ×2 IMPLANT
MANIPULATOR VCARE LG CRV RETR (MISCELLANEOUS) IMPLANT
MANIPULATOR VCARE SML CRV RETR (MISCELLANEOUS) IMPLANT
MANIPULATOR VCARE STD CRV RETR (MISCELLANEOUS) ×1 IMPLANT
NS IRRIG 1000ML POUR BTL (IV SOLUTION) ×2 IMPLANT
NS IRRIG 500ML POUR BTL (IV SOLUTION) ×2 IMPLANT
OCCLUDER COLPOPNEUMO (BALLOONS) IMPLANT
PACK GYN LAPAROSCOPIC (MISCELLANEOUS) ×2 IMPLANT
PAD OB MATERNITY 4.3X12.25 (PERSONAL CARE ITEMS) ×2 IMPLANT
PAD PREP 24X41 OB/GYN DISP (PERSONAL CARE ITEMS) ×2 IMPLANT
SCISSORS METZENBAUM CVD 33 (INSTRUMENTS) ×2 IMPLANT
SET CYSTO W/LG BORE CLAMP LF (SET/KITS/TRAYS/PACK) ×2 IMPLANT
SHEARS HARMONIC ACE PLUS 36CM (ENDOMECHANICALS) ×2 IMPLANT
SLEEVE ENDOPATH XCEL 5M (ENDOMECHANICALS) ×2 IMPLANT
STRAP SAFETY 5IN WIDE (MISCELLANEOUS) ×1 IMPLANT
SURGILUBE 2OZ TUBE FLIPTOP (MISCELLANEOUS) ×2 IMPLANT
SUT MNCRL 4-0 (SUTURE) ×1
SUT MNCRL 4-0 27XMFL (SUTURE) ×1
SUT VIC AB 0 CT1 27 (SUTURE)
SUT VIC AB 0 CT1 27XCR 8 STRN (SUTURE) ×1 IMPLANT
SUT VIC AB 0 CT1 36 (SUTURE) ×1 IMPLANT
SUT VLOC 180 0 6IN GS21 (SUTURE) ×1 IMPLANT
SUTURE MNCRL 4-0 27XMF (SUTURE) ×2 IMPLANT
SYR 10ML LL (SYRINGE) ×2 IMPLANT
SYR 50ML LL SCALE MARK (SYRINGE) ×2 IMPLANT
TROCAR ENDO BLADELESS 11MM (ENDOMECHANICALS) ×2 IMPLANT
TROCAR XCEL NON-BLD 5MMX100MML (ENDOMECHANICALS) ×2 IMPLANT
TUBING EVAC SMOKE HEATED PNEUM (TUBING) ×2 IMPLANT

## 2020-01-25 NOTE — Transfer of Care (Signed)
Immediate Anesthesia Transfer of Care Note  Patient: Stacey Huynh  Procedure(s) Performed: TOTAL LAPAROSCOPIC HYSTERECTOMY WITH BILATERAL SALPINGECTOMY (N/A ) CYSTOSCOPY (N/A )  Patient Location: PACU  Anesthesia Type:General  Level of Consciousness: sedated and drowsy  Airway & Oxygen Therapy: Patient Spontanous Breathing and Patient connected to face mask oxygen  Post-op Assessment: Report given to RN and Post -op Vital signs reviewed and stable  Post vital signs: Reviewed and stable  Last Vitals:  Vitals Value Taken Time  BP 121/53 01/25/20 1130  Temp 36.2 C 01/25/20 1130  Pulse    Resp 14 01/25/20 1132  SpO2 100 % 01/25/20 1130  Vitals shown include unvalidated device data.  Last Pain:  Vitals:   01/25/20 0833  TempSrc: Temporal  PainSc: 0-No pain         Complications: No apparent anesthesia complications

## 2020-01-25 NOTE — Progress Notes (Signed)
Date of Initial H&P: 01/08/2020  History reviewed, patient examined, no change in status, stable for surgery.

## 2020-01-25 NOTE — Op Note (Signed)
Preoperative Diagnosis: 1) 34 y.o. with abnormal uterine bleeding  Postoperative Diagnosis: 1) 34 y.o. with abnormal uterine bleeding   Operation Performed: Total laparoscopic hysterectomy, bilateral salpingectomy, and cystoscopy  Indication: Abnormal uterine bleeding with failed medical management  Surgeon: Malachy Mood, MD  Assistant: Barnett Applebaum, MD this surgery required a high level surgical assistant with none other readily available  Anesthesia: General  Preoperative Antibiotics: none  Estimated Blood Loss: 50 mL  IV Fluids: 1558mL of crystaloid  Urine Output:: 338mL of urine  Drains or Tubes: none  Implants: none  Specimens Removed: Uterus, cervix, and bilateral fallopian tubes  Complications: none  Intraoperative Findings: Normal tubes, ovaries, and uterus  Patient Condition: stable  Procedure in Detail:  Patient was taken to the operating room where she was administered general anesthesia.  She was positioned in the dorsal lithotomy position utilizing Allen stirups, prepped and draped in the usual sterile fashion.  Prior to proceeding with procedure a time out was performed.  Attention was turned to the patient's pelvis.  An indwelling foley catheter was placed to decompress the patient's bladder.  An operative speculum was placed to allow visualization of the cervix.  The anterior lip of the cervix was grasped with a single tooth tenaculum, and a medium V-care uterine manipulator was placed to allow manipulation of the uterus.  The operative speculum and single tooth tenaculum were then removed.  Attention was turned to the patient's abdomen.  The umbilicus was infiltrated with 1% Sensorcaine, before making a stab incision using an 11 blade scalpel.  A 39mm Excel trocar was then used to gain direct entry into the peritoneal cavity utilizing the camera to visualize progress of the trocar during placement.  Once peritoneal entry had been achieved, insufflation was  started and pneumoperitoneum established at a pressure of 45mmHg.    One left and one right lower quadrant site were then injected with 1% Sensorcaine and a stab incision was made using an 11 blade scalpel.  Two additional 68mm Excel trocars were placed through these incisions under direct visualization. The umbilical trocar was stepped up to an 37mm Excel trocar.  General inspection of the abdomen revealed the above noted findings.   The leftt tube was identified and grasped at its fimbriated end.  The tube was transected from its attachments to the ovary and mesosalpinx using a 16mm Harmonic scalpel.  The utero ovarian ligament was identified ligated and transected using the Harmonic scalpel. The round ligament was then likewise ligated and transected.  The anterior leaf of the broad ligament was dissected down to the level of the internal cervical os and a bladder flap was started.  The posterior leaf of the broad ligament was dissected down to the utero-sacral ligament.  The uterine artery was skeletonized before being ligated and transected using the Harmonic scalpel with cephelad pressure applied to the V-care device to assure lateralization of the ureter.  A bite was then taken with Harmonic medial to transected portio of uterine artery to further lateralize the ureter and vessel off the V-care cup.  The patient right adnexal structures were then dissected in similar fashion by Dr. Kenton Kingfisher.  The bladder flap was completed and the bladder mobilized off the V-care cup.  An anterior colpotomy was scores and carried around in a clockwise fashion to free the specimen with Dr. Kenton Kingfisher aiding in carrying the colpotomy around the right aspect of specimen, which was then removed vaginally.  Inspection revealed all pedicles to be hemostatic before proceed with  vaginal closure.  An Endostitch device was used to close the cuff utilizing a V-loc load in a running fashion ensuring good bites of vaginal mucosa.  Edges were  noted to be well approximated with no palpable defects and hemostatic. A PMI was used to close the fascia of the 49mm port site which was also closed using a 4-0 Monocryl in a subcuticular fashion.-.Pneumoperitoneum was evacuated and the remaining trocars were removed.  All trocar sites were then dressed with surgical skin glue.     The indwelling foley catheter was removed.  Cystoscopy was performed noting and intact bladder dome as well as brisk efflux of urine from bother ureteral orifices.  The cystoscopy was removed and the indwelling foley catheter was replaced.    Sponge needle and instrument counts were correct time two.  The patient tolerated the procedure well and was taken to the recovery room in stable condition.

## 2020-01-25 NOTE — Anesthesia Procedure Notes (Signed)
Procedure Name: Intubation Date/Time: 01/25/2020 9:59 AM Performed by: Ginger Carne, CRNA Pre-anesthesia Checklist: Patient identified, Suction available, Emergency Drugs available, Patient being monitored and Timeout performed Patient Re-evaluated:Patient Re-evaluated prior to induction Oxygen Delivery Method: Circle system utilized Preoxygenation: Pre-oxygenation with 100% oxygen Induction Type: IV induction Ventilation: Mask ventilation without difficulty and Oral airway inserted - appropriate to patient size Laryngoscope Size: McGraph and 3 Grade View: Grade I Tube type: Oral Tube size: 7.0 mm Number of attempts: 1 Airway Equipment and Method: Stylet and Video-laryngoscopy Placement Confirmation: ETT inserted through vocal cords under direct vision,  positive ETCO2 and breath sounds checked- equal and bilateral Secured at: 21 cm Tube secured with: Tape Dental Injury: Teeth and Oropharynx as per pre-operative assessment

## 2020-01-25 NOTE — Anesthesia Postprocedure Evaluation (Signed)
Anesthesia Post Note  Patient: Stacey Huynh  Procedure(s) Performed: TOTAL LAPAROSCOPIC HYSTERECTOMY WITH BILATERAL SALPINGECTOMY (N/A ) CYSTOSCOPY (N/A )  Patient location during evaluation: PACU Anesthesia Type: General Level of consciousness: awake and alert Pain management: pain level controlled Vital Signs Assessment: post-procedure vital signs reviewed and stable Respiratory status: spontaneous breathing and respiratory function stable Cardiovascular status: stable Anesthetic complications: no     Last Vitals:  Vitals:   01/25/20 1207 01/25/20 1230  BP: 110/71 119/73  Pulse:  86  Resp: 12 16  Temp: (!) 36.3 C 36.6 C  SpO2: 97% 98%    Last Pain:  Vitals:   01/25/20 1230  TempSrc: Oral  PainSc: (P) 6                  Mekai Wilkinson K

## 2020-01-25 NOTE — Anesthesia Preprocedure Evaluation (Signed)
Anesthesia Evaluation  Patient identified by MRN, date of birth, ID band Patient awake    Reviewed: Allergy & Precautions, NPO status , Patient's Chart, lab work & pertinent test results  History of Anesthesia Complications Negative for: history of anesthetic complications  Airway Mallampati: II       Dental   Pulmonary neg sleep apnea, neg COPD, Not current smoker,           Cardiovascular hypertension, Pt. on medications (-) Past MI and (-) CHF (-) dysrhythmias (-) Valvular Problems/Murmurs     Neuro/Psych neg Seizures Anxiety    GI/Hepatic Neg liver ROS, neg GERD  ,  Endo/Other  neg diabetes  Renal/GU negative Renal ROS     Musculoskeletal   Abdominal   Peds  Hematology   Anesthesia Other Findings   Reproductive/Obstetrics                             Anesthesia Physical Anesthesia Plan  ASA: II  Anesthesia Plan: General   Post-op Pain Management:    Induction: Intravenous  PONV Risk Score and Plan: 3 and Ondansetron, Dexamethasone and Midazolam  Airway Management Planned: Oral ETT  Additional Equipment:   Intra-op Plan:   Post-operative Plan:   Informed Consent: I have reviewed the patients History and Physical, chart, labs and discussed the procedure including the risks, benefits and alternatives for the proposed anesthesia with the patient or authorized representative who has indicated his/her understanding and acceptance.       Plan Discussed with:   Anesthesia Plan Comments:         Anesthesia Quick Evaluation

## 2020-01-26 DIAGNOSIS — Z793 Long term (current) use of hormonal contraceptives: Secondary | ICD-10-CM | POA: Diagnosis not present

## 2020-01-26 DIAGNOSIS — I10 Essential (primary) hypertension: Secondary | ICD-10-CM | POA: Diagnosis not present

## 2020-01-26 DIAGNOSIS — N920 Excessive and frequent menstruation with regular cycle: Secondary | ICD-10-CM | POA: Diagnosis not present

## 2020-01-26 DIAGNOSIS — R102 Pelvic and perineal pain: Secondary | ICD-10-CM | POA: Diagnosis not present

## 2020-01-26 LAB — BASIC METABOLIC PANEL
Anion gap: 8 (ref 5–15)
BUN: 12 mg/dL (ref 6–20)
CO2: 24 mmol/L (ref 22–32)
Calcium: 8.6 mg/dL — ABNORMAL LOW (ref 8.9–10.3)
Chloride: 105 mmol/L (ref 98–111)
Creatinine, Ser: 0.77 mg/dL (ref 0.44–1.00)
GFR calc Af Amer: >60 mL/min (ref >60)
GFR calc non Af Amer: >60 mL/min (ref >60)
Glucose, Bld: 123 mg/dL — ABNORMAL HIGH (ref 70–99)
Potassium: 4.1 mmol/L (ref 3.5–5.1)
Sodium: 137 mmol/L (ref 135–145)

## 2020-01-26 LAB — CBC
HCT: 36.2 % (ref 36.0–46.0)
Hemoglobin: 12.2 g/dL (ref 12.0–15.0)
MCH: 31 pg (ref 26.0–34.0)
MCHC: 33.7 g/dL (ref 30.0–36.0)
MCV: 91.9 fL (ref 80.0–100.0)
Platelets: 279 10*3/uL (ref 150–400)
RBC: 3.94 MIL/uL (ref 3.87–5.11)
RDW: 12.4 % (ref 11.5–15.5)
WBC: 12.1 10*3/uL — ABNORMAL HIGH (ref 4.0–10.5)
nRBC: 0 % (ref 0.0–0.2)

## 2020-01-26 MED ORDER — IBUPROFEN 600 MG PO TABS: 600.0000 mg | ORAL_TABLET | Freq: Four times a day (QID) | ORAL | 0 refills | Status: DC

## 2020-01-26 MED ORDER — OXYCODONE-ACETAMINOPHEN 5-325 MG PO TABS: 1.0000 | ORAL_TABLET | ORAL | 0 refills | Status: DC | PRN

## 2020-01-26 NOTE — Progress Notes (Signed)
Patient discharged home. Discharge instructions, prescriptions and follow up appointment given to and reviewed with patient. Patient verbalized understanding. 

## 2020-01-26 NOTE — Discharge Summary (Signed)
Physician Discharge Summary  Patient ID: Stacey Huynh MRN: [id number] DOB/AGE: [birth year] 34 y.o.  Admit date: 01/25/2020 Discharge date: 01/26/2020  Admission Diagnoses:  Discharge Diagnoses:  Active Problems:   S/P laparoscopic hysterectomy   Abnormal uterine bleeding   Discharged Condition: good  Hospital Course: Patient underwent scheduled TLH, BS, cystoscopy for abnormal uterine bleeding.  Surgery uneventful.  The patient was admitted for standard overnight observation.  She had her follow removed on the afternoon of POD0.  At the time of discharge the patient reported good pain control on po analgesics, was voiding, ambulating, and tolerating po.  She remained hemodynamically stable and afebrile throughout admission.  POD1 H&H, BUN, and creatinine stable.    Consults: None  Significant Diagnostic Studies:  Results for orders placed or performed during the hospital encounter of 01/25/20 (from the past 24 hour(s))  Pregnancy, urine POC     Status: None   Collection Time: 01/25/20  8:28 AM  Result Value Ref Range   Preg Test, Ur NEGATIVE NEGATIVE  CBC     Status: Abnormal   Collection Time: 01/26/20  6:19 AM  Result Value Ref Range   WBC 12.1 (H) 4.0 - 10.5 K/uL   RBC 3.94 3.87 - 5.11 MIL/uL   Hemoglobin 12.2 12.0 - 15.0 g/dL   HCT 36.2 36.0 - 46.0 %   MCV 91.9 80.0 - 100.0 fL   MCH 31.0 26.0 - 34.0 pg   MCHC 33.7 30.0 - 36.0 g/dL   RDW 12.4 11.5 - 15.5 %   Platelets 279 150 - 400 K/uL   nRBC 0.0 0.0 - 0.2 %  Basic metabolic panel     Status: Abnormal   Collection Time: 01/26/20  6:19 AM  Result Value Ref Range   Sodium 137 135 - 145 mmol/L   Potassium 4.1 3.5 - 5.1 mmol/L   Chloride 105 98 - 111 mmol/L   CO2 24 22 - 32 mmol/L   Glucose, Bld 123 (H) 70 - 99 mg/dL   BUN 12 6 - 20 mg/dL   Creatinine, Ser 0.77 0.44 - 1.00 mg/dL   Calcium 8.6 (L) 8.9 - 10.3 mg/dL   GFR calc non Af Amer >60 >60 mL/min   GFR calc Af Amer >60 >60 mL/min   Anion gap 8 5 - 15      Treatments: surgery: TLH, BS, cystoscopy 01/26/2020  Discharge Exam: Blood pressure 114/64, pulse 64, temperature (!) 97.5 F (36.4 C), temperature source Oral, resp. rate 20, height 5\' 6"  (1.676 m), weight 99.3 kg, SpO2 97 %. General appearance: alert, appears stated age and no distress Resp: no increased work of breathing GI: soft, non-tender, non-distended Skin: Skin color, texture, turgor normal. No rashes or lesions Incision/Wound:D/C/I  Disposition: Discharge disposition: 01-Home or Self Care       Discharge Instructions    Call MD for:   Complete by: As directed    Heavy vaginal bleeding greater than 1 pad an hour   Call MD for:  difficulty breathing, headache or visual disturbances   Complete by: As directed    Call MD for:  extreme fatigue   Complete by: As directed    Call MD for:  hives   Complete by: As directed    Call MD for:  persistant dizziness or light-headedness   Complete by: As directed    Call MD for:  persistant nausea and vomiting   Complete by: As directed    Call MD for:  redness, tenderness, or signs  of infection (pain, swelling, redness, odor or green/yellow discharge around incision site)   Complete by: As directed    Call MD for:  severe uncontrolled pain   Complete by: As directed    Call MD for:  temperature >100.4   Complete by: As directed    Diet general   Complete by: As directed    Discharge wound care:   Complete by: As directed    You may apply a light dressing for minor discharge from the incisions or to keep waistbands of clothing from rubbing.  You may shower, use soap on your incision.  Avoid baths or soaking the incision in the first 6 weeks following your surgery..   Driving restriction   Complete by: As directed    Avoid driving for at least 2 weeks or while taking prescription narcotics.   Lifting restrictions   Complete by: As directed    Weight restriction of 10lbs for 6 weeks.     Allergies as of 01/26/2020       Reactions   Penicillins Other (See Comments)   Did it involve swelling of the face/tongue/throat, SOB, or low BP? Unknown Did it involve sudden or severe rash/hives, skin peeling, or any reaction on the inside of your mouth or nose? Unknown Did you need to seek medical attention at a hospital or doctor's office? Unknown When did it last happen? Childhood allergy If all above answers are "NO", may proceed with cephalosporin use.      Medication List    TAKE these medications   fexofenadine 180 MG tablet Commonly known as: ALLEGRA Take 180 mg by mouth daily.   ibuprofen 600 MG tablet Commonly known as: ADVIL Take 1 tablet (600 mg total) by mouth every 6 (six) hours.   lisinopril 10 MG tablet Commonly known as: ZESTRIL TAKE 1 TABLET BY MOUTH EVERY DAY   oxyCODONE-acetaminophen 5-325 MG tablet Commonly known as: PERCOCET/ROXICET Take 1 tablet by mouth every 4 (four) hours as needed for moderate pain or severe pain ((when tolerating fluids)).   traZODone 50 MG tablet Commonly known as: DESYREL Take 1 tablet (50 mg total) by mouth at bedtime.            Discharge Care Instructions  (From admission, onward)         Start     Ordered   01/26/20 0000  Discharge wound care:    Comments: You may apply a light dressing for minor discharge from the incisions or to keep waistbands of clothing from rubbing.  You may shower, use soap on your incision.  Avoid baths or soaking the incision in the first 6 weeks following your surgery.Marland Kitchen   01/26/20 0724         Follow-up Information    Vena Austria, MD In 1 week.   Specialty: Obstetrics and Gynecology Why: For wound re-check Contact information: [address] [place] [address] [phone]           Signed: Vena Austria 01/26/2020, 7:24 AM

## 2020-01-29 ENCOUNTER — Other Ambulatory Visit: Payer: Self-pay | Admitting: Obstetrics and Gynecology

## 2020-01-29 ENCOUNTER — Telehealth: Payer: Self-pay

## 2020-01-29 DIAGNOSIS — Z9889 Other specified postprocedural states: Secondary | ICD-10-CM

## 2020-01-29 DIAGNOSIS — R112 Nausea with vomiting, unspecified: Secondary | ICD-10-CM

## 2020-01-29 LAB — SURGICAL PATHOLOGY

## 2020-01-29 MED ORDER — ONDANSETRON 8 MG PO TBDP: 8.0000 mg | ORAL_TABLET | Freq: Four times a day (QID) | ORAL | 0 refills | Status: DC | PRN

## 2020-01-29 NOTE — Telephone Encounter (Signed)
I'll call in Zofran.  [facility] may consider calling her to check on her to make sure he doesn't need to see her sooner.

## 2020-01-29 NOTE — Telephone Encounter (Signed)
Pt states she had surgery on 4/15 with AMS. She has been having severe nausea. Can she get a medication called in or advise.

## 2020-01-31 ENCOUNTER — Ambulatory Visit (INDEPENDENT_AMBULATORY_CARE_PROVIDER_SITE_OTHER): Payer: BC Managed Care – PPO | Admitting: Obstetrics and Gynecology

## 2020-01-31 ENCOUNTER — Other Ambulatory Visit: Payer: Self-pay

## 2020-01-31 ENCOUNTER — Encounter: Payer: Self-pay | Admitting: Obstetrics and Gynecology

## 2020-01-31 VITALS — BP 120/80 | Ht 66.0 in | Wt 217.0 lb

## 2020-01-31 DIAGNOSIS — Z4889 Encounter for other specified surgical aftercare: Secondary | ICD-10-CM

## 2020-01-31 NOTE — Progress Notes (Signed)
Postoperative Follow-up Patient presents post op from [facility], BS, cystosocopy 1weeks ago for abnormal uterine bleeding.  Subjective: Patient reports some improvement in her preop symptoms. Eating a regular diet without difficulty. Pain is controlled without any medications.  Activity: normal activities of daily living.  Objective: Blood pressure 120/80, height 5\' 6"  (1.676 m), weight 217 lb (98.4 kg).  General: NAD Pulmonary: no increased work of breathing Abdomen: soft, non-tender, non-distended, incision(s) D/C/I Extremities: no edema Neurologic: normal gait    Admission on 01/25/2020, Discharged on 01/26/2020  Component Date Value Ref Range Status  . Preg Test, Ur 01/25/2020 NEGATIVE  NEGATIVE Final   Comment:        THE SENSITIVITY OF THIS METHODOLOGY IS >24 mIU/mL   . SURGICAL PATHOLOGY 01/25/2020    Final-Edited                   Value:SURGICAL PATHOLOGY CASE: [id number] PATIENT: Stacey Huynh Surgical Pathology Report     Specimen Submitted: A. Uterus, cervix, bilateral tubes  Clinical History: Abnormal uterine bleeding N93.9     DIAGNOSIS: A. UTERUS WITH CERVIX; HYSTERECTOMY: - CERVIX WITHOUT PATHOLOGIC CHANGES. - WEAKLY PROLIFERATIVE ENDOMETRIUM. - MYOMETRIUM WITHOUT PATHOLOGIC CHANGES.  BILATERAL FALLOPIAN TUBES; SALPINGECTOMY: - NO PATHOLOGIC CHANGES.  GROSS DESCRIPTION: A. Labeled: Uterus with cervix and bilateral tubes Received: Formalin Weight: 56 grams Dimensions:      Fundus - 5.0 x 4.5 x 3.3 cm      Cervix - 2.7 x 2.7 x 2.4 cm Serosa: Tan and smooth Cervix: The ectocervical mucosa is pale-tan, smooth, and grossly unremarkable.  The external os is 0.9 cm in diameter, patent, and slit-like shaped. Endocervix: 2.4 cm in length by 0.8 cm in diameter.  The endocervical mucosa is tan, striated, and grossly unremarkable. Endometrial cavity:      Dimensions - 3.4 cm in l                         ength by 1.8 cm cornu to cornu width       Thickness - 0.1 cm      Other findings - The endometrium is tan and grossly unremarkable. No polyps or abnormalities are grossly identified. Myometrium:     Thickness - 1.5 cm (average)     Other findings - Sectioning displays tan, grossly unremarkable myometrium.  No nodules or abnormalities are grossly identified. Adnexa:      Right fallopian tube           Measurements - 5.9 cm in length x 0.5 cm in diameter           Other findings - Fimbriated.  The external surface is tan-purple, smooth, and finely vascular.  Sectioning displays a pinpoint, grossly unremarkable lumen.      Left fallopian tube            Measurements - 5.9 cm in length x 0.5 cm in diameter           Other findings - Fimbriated.  The external surface is tan-purple, smooth, and finely vascular.  Sectioning displays a pinpoint, grossly unremarkable lumen.  Block summary: 1 - anterior cervix 2 - posterior cervix 3 - anterior endomyometrium 4 - posterior                          endomyometrium 5 - right fallopian tube with longitudinally sectioned fimbria and cross-sections 6 - left fallopian tube  with longitudinally sectioned fimbria and cross-sections   Final Diagnosis performed by Ronald Lobo, MD.   Electronically signed 01/29/2020 4:25:53PM The electronic signature indicates that the named Attending Pathologist has evaluated the specimen Technical component performed at [facility], [address], [place], [place] [address] Lab: [phone] Dir: Jolene Schimke, MD, MMM  Professional component performed at [facility], [facility], [address] [place], [place], [place] [address] Lab: [phone] Dir: Georgiann Cocker. Rubinas, MD   . WBC 01/26/2020 12.1* 4.0 - 10.5 K/uL Final  . RBC 01/26/2020 3.94  3.87 - 5.11 MIL/uL Final  . Hemoglobin 01/26/2020 12.2  12.0 - 15.0 g/dL Final  . HCT 40/07/2724 36.2  36.0 - 46.0 % Final  . MCV 01/26/2020 91.9  80.0 - 100.0 fL Final  . MCH 01/26/2020 31.0  26.0 - 34.0  pg Final  . MCHC 01/26/2020 33.7  30.0 - 36.0 g/dL Final  . RDW 36/64/4034 12.4  11.5 - 15.5 % Final  . Platelets 01/26/2020 279  150 - 400 K/uL Final  . nRBC 01/26/2020 0.0  0.0 - 0.2 % Final   Performed at [facility], [address], [place], [place] [address]  . Sodium 01/26/2020 137  135 - 145 mmol/L Final  . Potassium 01/26/2020 4.1  3.5 - 5.1 mmol/L Final  . Chloride 01/26/2020 105  98 - 111 mmol/L Final  . CO2 01/26/2020 24  22 - 32 mmol/L Final  . Glucose, Bld 01/26/2020 123* 70 - 99 mg/dL Final   Glucose reference range applies only to samples taken after fasting for at least 8 hours.  . BUN 01/26/2020 12  6 - 20 mg/dL Final  . Creatinine, Ser 01/26/2020 0.77  0.44 - 1.00 mg/dL Final  . Calcium 56/38/7564 8.6* 8.9 - 10.3 mg/dL Final  . GFR calc non Af Amer 01/26/2020 >60  >60 mL/min Final  . GFR calc Af Amer 01/26/2020 >60  >60 mL/min Final  . Anion gap 01/26/2020 8  5 - 15 Final   Performed at [facility], [address] [place], [place] [address]    Assessment: 34 y.o. s/p TLH, BS, cystoscopy stable  Plan: Patient has done well after surgery with no apparent complications.  I have discussed the post-operative course to date, and the expected progress moving forward.  The patient understands what complications to be concerned about.  I will see the patient in routine follow up, or sooner if needed.    Activity plan: No heavy lifting, no intercourse  [employer] and safety [employer] job is considering returning back to work at 2 weeks   Vena Austria, MD, [facility], [facility] 01/31/2020, 10:05 AM

## 2020-02-08 ENCOUNTER — Other Ambulatory Visit: Payer: Self-pay | Admitting: Obstetrics and Gynecology

## 2020-02-08 MED ORDER — OXYCODONE-ACETAMINOPHEN 5-325 MG PO TABS: 1.0000 | ORAL_TABLET | ORAL | 0 refills | Status: DC | PRN

## 2020-02-19 ENCOUNTER — Encounter: Payer: Self-pay | Admitting: Family Medicine

## 2020-02-19 ENCOUNTER — Ambulatory Visit (INDEPENDENT_AMBULATORY_CARE_PROVIDER_SITE_OTHER): Payer: BLUE CROSS/BLUE SHIELD | Admitting: Family Medicine

## 2020-02-19 ENCOUNTER — Other Ambulatory Visit: Payer: Self-pay | Admitting: Family Medicine

## 2020-02-19 ENCOUNTER — Other Ambulatory Visit: Payer: Self-pay

## 2020-02-19 VITALS — BP 117/63 | HR 82 | Temp 97.1°F | Ht 66.0 in | Wt 221.6 lb

## 2020-02-19 DIAGNOSIS — I10 Essential (primary) hypertension: Secondary | ICD-10-CM | POA: Diagnosis not present

## 2020-02-19 DIAGNOSIS — Z0000 Encounter for general adult medical examination without abnormal findings: Secondary | ICD-10-CM

## 2020-02-19 DIAGNOSIS — F419 Anxiety disorder, unspecified: Secondary | ICD-10-CM | POA: Diagnosis not present

## 2020-02-19 DIAGNOSIS — R7309 Other abnormal glucose: Secondary | ICD-10-CM | POA: Diagnosis not present

## 2020-02-19 DIAGNOSIS — J302 Other seasonal allergic rhinitis: Secondary | ICD-10-CM

## 2020-02-19 DIAGNOSIS — R635 Abnormal weight gain: Secondary | ICD-10-CM

## 2020-02-19 DIAGNOSIS — R140 Abdominal distension (gaseous): Secondary | ICD-10-CM

## 2020-02-19 LAB — POCT GLYCOSYLATED HEMOGLOBIN (HGB A1C): Hemoglobin A1C: 5.6 % (ref 4.0–5.6)

## 2020-02-19 MED ORDER — LISINOPRIL 10 MG PO TABS: 10.0000 mg | ORAL_TABLET | Freq: Every day | ORAL | 1 refills | Status: DC

## 2020-02-19 MED ORDER — FLUOXETINE HCL 10 MG PO CAPS: ORAL_CAPSULE | ORAL | 0 refills | Status: DC

## 2020-02-19 MED ORDER — FEXOFENADINE HCL 180 MG PO TABS: 180.0000 mg | ORAL_TABLET | Freq: Every day | ORAL | 1 refills | Status: DC

## 2020-02-19 NOTE — Assessment & Plan Note (Addendum)
Annual physical exam without new findings.  Plan: 1. Obtain health maintenance screenings as above according to age. - Increase physical activity to 30 minutes most days of the week.  - Eat healthy diet high in vegetables and fruits; low in refined carbohydrates. - Screening labs and tests as ordered 2. Return 1 year for annual physical.  

## 2020-02-19 NOTE — Assessment & Plan Note (Signed)
Reports increased anxiety as she gets closer to going to bed.  Has taken trazodone 50mg  nightly with some results, reports not working as well as would like.  Discussed medications that can help with depression/anxiety.  Has taken sertraline in the past and reports managed symptoms but felt like a zombie.  Open to trying fluoxetine.  Discussed setting up a sleep hygiene routine and provided handout on mood and sleep hygiene.  Plan: 1. Begin fluoxetine 10mg  daily x 7 days and then increase to fluoxetine 20mg  daily. 2. Work on a sleep hygiene routine 3. Read the mood handout and find ways to incorporate meditation, guided imagery, and mindfulness over the coming weeks 4. Follow up in 3-4 weeks for medication follow up

## 2020-02-19 NOTE — Telephone Encounter (Signed)
Per note from pharmacy- pt's insurance only pays for 1 tablet per day Note attached to request:Pharmacy comment: Alternative Requested:INSUR LIMITS ONE CAPSULE DAILY. Routing back to office for provider to review.

## 2020-02-19 NOTE — Assessment & Plan Note (Signed)
Reports feeling of GI bloating.  Does not have concerns around any particular foods but believes symptoms have happened since she was diagnosed with Lyme years ago.  Discussed looking at diet and can do an elimination diet to see if there are certain foods that are causing her symptoms.  Provided information for two books she can check out locally at the library/online.  Plan: 1. Can keep food journal/diary and perform elimination diet to see if triggers are found 2. Increase water intake to see if this will help 3. Follow up in 3-4 weeks

## 2020-02-19 NOTE — Progress Notes (Signed)
Subjective:    Patient ID: Stacey Huynh, female    DOB: [birth year], 34 y.o.   MRN: [id number]  Stacey Huynh is a 34 y.o. female presenting on 02/19/2020 for Annual Exam   HPI   Annual Physical Exam Patient has been feeling increased anxiety with interrupted sleep nightly.   HEALTH MAINTENANCE:  Weight/BMI: 16lb weight gain since last in office visit with our clinic on 12/08/2018 Physical activity: No structured exercise Diet: No structured diet Seatbelt: 100% of the time Sunscreen: Always Optometry: Once per year Dentistry: Every 6 months  IMMUNIZATIONS: Influenza: Due next season Tetanus: 01/09/2013 COVID: Discussed  Depression screen [facility] 2/9 06/16/2019 12/08/2018 03/24/2018  Decreased Interest 0 0 0  Down, Depressed, Hopeless 0 0 0  PHQ - 2 Score 0 0 0  Altered sleeping 3 - -  Tired, decreased energy 1 - -  Change in appetite 0 - -  Feeling bad or failure about yourself  0 - -  Trouble concentrating 0 - -  Moving slowly or fidgety/restless 0 - -  Suicidal thoughts 0 - -  PHQ-9 Score 4 - -  Difficult doing work/chores Not difficult at all - -    Past Medical History:  Diagnosis Date  . Abnormal Pap smear   . Abnormal Pap smear of cervix   . Anxiety   . Hypertension    Past Surgical History:  Procedure Laterality Date  . CYSTOSCOPY N/A 01/25/2020   Procedure: CYSTOSCOPY;  Surgeon: Vena Austria, MD;  Location: [facility] ORS;  Service: Gynecology;  Laterality: N/A;  . LAPAROSCOPIC OVARIAN CYSTECTOMY Right 04/13/2019   Procedure: LAPAROSCOPIC RIGHT OVARIAN CYSTECTOMY and removal of right paratubal cyst;  Surgeon: Vena Austria, MD;  Location: [facility] ORS;  Service: Gynecology;  Laterality: Right;  . LEEP    . TOTAL LAPAROSCOPIC HYSTERECTOMY WITH SALPINGECTOMY N/A 01/25/2020   Procedure: TOTAL LAPAROSCOPIC HYSTERECTOMY WITH BILATERAL SALPINGECTOMY;  Surgeon: Vena Austria, MD;  Location: [facility] ORS;  Service: Gynecology;  Laterality: N/A;  . WISDOM TOOTH  EXTRACTION     Social History   Socioeconomic History  . Marital status: Married    Spouse name: Not on file  . Number of children: Not on file  . Years of education: Not on file  . Highest education level: Not on file  Occupational History  . Not on file  Tobacco Use  . Smoking status: Never Smoker  . Smokeless tobacco: Never Used  Substance and Sexual Activity  . Alcohol use: Yes    Comment: rare  . Drug use: No  . Sexual activity: Yes    Birth control/protection: Pill  Other Topics Concern  . Not on file  Social History Narrative  . Not on file   Social Determinants of Health   Financial Resource Strain:   . Difficulty of Paying Living Expenses:   Food Insecurity:   . Worried About Programme researcher, broadcasting/film/video in the Last Year:   . Barista in the Last Year:   Transportation Needs:   . Freight forwarder (Medical):   Marland Kitchen Lack of Transportation (Non-Medical):   Physical Activity:   . Days of Exercise per Week:   . Minutes of Exercise per Session:   Stress:   . Feeling of Stress :   Social Connections:   . Frequency of Communication with Friends and Family:   . Frequency of Social Gatherings with Friends and Family:   . Attends Religious Services:   . Active Member of Clubs or  Organizations:   . Attends Banker Meetings:   Marland Kitchen Marital Status:   Intimate Partner Violence:   . Fear of Current or Ex-Partner:   . Emotionally Abused:   Marland Kitchen Physically Abused:   . Sexually Abused:    Family History  Problem Relation Age of Onset  . Pancreatic cancer Paternal Aunt   . Bone cancer Maternal Grandmother   . Lung cancer Maternal Grandmother   . Colon cancer Paternal Grandfather   . Depression Mother   . Depression Father   . Suicidality Father    Current Outpatient Medications on File Prior to Visit  Medication Sig  . traZODone (DESYREL) 50 MG tablet Take 1 tablet (50 mg total) by mouth at bedtime.   No current facility-administered medications on  file prior to visit.    Per HPI unless specifically indicated above     Objective:    BP 117/63   Pulse 82   Temp (!) 97.1 F (36.2 C) (Temporal)   Ht 5\' 6"  (1.676 m)   Wt 221 lb 9.6 oz (100.5 kg)   LMP 11/20/2019 (Approximate)   BMI 35.77 kg/m   Wt Readings from Last 3 Encounters:  02/19/20 221 lb 9.6 oz (100.5 kg)  01/31/20 217 lb (98.4 kg)  01/25/20 219 lb (99.3 kg)    Physical Exam Vitals reviewed.  Constitutional:      General: She is not in acute distress.    Appearance: Normal appearance. She is well-developed and well-groomed. She is obese. She is not ill-appearing or toxic-appearing.  HENT:     Head: Normocephalic.     Right Ear: Tympanic membrane, ear canal and external ear normal. There is no impacted cerumen.     Left Ear: Tympanic membrane, ear canal and external ear normal. There is no impacted cerumen.     Nose: Nose normal. No congestion or rhinorrhea.     Mouth/Throat:     Lips: Pink.     Mouth: Mucous membranes are moist.     Pharynx: Oropharynx is clear. Uvula midline. No oropharyngeal exudate or posterior oropharyngeal erythema.  Eyes:     General: Lids are normal. Vision grossly intact. No scleral icterus.       Right eye: No discharge.        Left eye: No discharge.     Extraocular Movements: Extraocular movements intact.     Conjunctiva/sclera: Conjunctivae normal.     Pupils: Pupils are equal, round, and reactive to light.  Neck:     Thyroid: No thyroid mass or thyromegaly.  Cardiovascular:     Rate and Rhythm: Normal rate and regular rhythm.     Pulses: Normal pulses.          Dorsalis pedis pulses are 2+ on the right side and 2+ on the left side.     Heart sounds: Normal heart sounds. No murmur. No friction rub. No gallop.   Pulmonary:     Effort: Pulmonary effort is normal. No respiratory distress.     Breath sounds: Normal breath sounds.  Abdominal:     General: Abdomen is flat. Bowel sounds are normal. There is no distension.      Palpations: Abdomen is soft. There is no hepatomegaly, splenomegaly or mass.     Tenderness: There is no abdominal tenderness. There is no guarding or rebound.     Hernia: No hernia is present.  Musculoskeletal:        General: Normal range of motion.     Cervical  back: Normal range of motion and neck supple. No tenderness.     Right lower leg: No edema.     Left lower leg: No edema.  Feet:     Right foot:     Skin integrity: Skin integrity normal.     Left foot:     Skin integrity: Skin integrity normal.  Lymphadenopathy:     Cervical: No cervical adenopathy.     Right cervical: No superficial cervical adenopathy.    Left cervical: No superficial cervical adenopathy.  Skin:    General: Skin is warm and dry.     Capillary Refill: Capillary refill takes less than 2 seconds.  Neurological:     General: No focal deficit present.     Mental Status: She is alert and oriented to person, place, and time.     Cranial Nerves: No cranial nerve deficit.     Sensory: No sensory deficit.     Motor: No weakness.     Coordination: Coordination normal.     Gait: Gait normal.     Deep Tendon Reflexes: Reflexes normal.  Psychiatric:        Attention and Perception: Attention and perception normal.        Mood and Affect: Mood and affect normal.        Speech: Speech normal.        Behavior: Behavior normal. Behavior is cooperative.        Thought Content: Thought content normal.        Cognition and Memory: Cognition and memory normal.        Judgment: Judgment normal.     Results for orders placed or performed in visit on 02/19/20  POCT glycosylated hemoglobin (Hb A1C)  Result Value Ref Range   Hemoglobin A1C 5.6 4.0 - 5.6 %   HbA1c POC (<> result, manual entry)     HbA1c, POC (prediabetic range)     HbA1c, POC (controlled diabetic range)        Assessment & Plan:   Problem List Items Addressed This Visit      Cardiovascular and Mediastinum   Essential hypertension    Stable and  well controlled hypertension.  BP is at goal < 130/80.  Pt is working on lifestyle modifications.  Taking medications tolerating well without side effects. No known complications.  Plan: 1. Continue taking lisinopril 10mg  daily 2. Obtain labs ordered today  3. Encouraged heart healthy diet and increasing exercise to 30 minutes most days of the week, going no more than 2 days in a row without exercise. 4. Check BP 1-2 x per week at home, keep log, and bring to clinic at next appointment. 5. Follow up 6 months.         Relevant Medications   lisinopril (ZESTRIL) 10 MG tablet   Other Relevant Orders   Urinalysis, microscopic only     Other   Anxiety    Reports increased anxiety as she gets closer to going to bed.  Has taken trazodone 50mg  nightly with some results, reports not working as well as would like.  Discussed medications that can help with depression/anxiety.  Has taken sertraline in the past and reports managed symptoms but felt like a zombie.  Open to trying fluoxetine.  Discussed setting up a sleep hygiene routine and provided handout on mood and sleep hygiene.  Plan: 1. Begin fluoxetine 10mg  daily x 7 days and then increase to fluoxetine 20mg  daily. 2. Work on a sleep hygiene routine 3.  Read the mood handout and find ways to incorporate meditation, guided imagery, and mindfulness over the coming weeks 4. Follow up in 3-4 weeks for medication follow up      Relevant Medications   FLUoxetine (PROZAC) 10 MG capsule   Routine medical exam - Primary    Annual physical exam without new findings.    Plan: 1. Obtain health maintenance screenings as above according to age. - Increase physical activity to 30 minutes most days of the week.  - Eat healthy diet high in vegetables and fruits; low in refined carbohydrates. - Screening labs and tests as ordered 2. Return 1 year for annual physical.       Relevant Orders   Lipid Profile   Thyroid Panel With TSH   Bloating     Reports feeling of GI bloating.  Does not have concerns around any particular foods but believes symptoms have happened since she was diagnosed with Lyme years ago.  Discussed looking at diet and can do an elimination diet to see if there are certain foods that are causing her symptoms.  Provided information for two books she can check out locally at the library/online.  Plan: 1. Can keep food journal/diary and perform elimination diet to see if triggers are found 2. Increase water intake to see if this will help 3. Follow up in 3-4 weeks       Other Visit Diagnoses    Seasonal allergies       Relevant Medications   fexofenadine (ALLEGRA) 180 MG tablet   Elevated glucose       Relevant Orders   POCT glycosylated hemoglobin (Hb A1C) (Completed)   Weight gain       Relevant Orders   Thyroid Panel With TSH      Meds ordered this encounter  Medications  . FLUoxetine (PROZAC) 10 MG capsule    Sig: Take 1 capsule (10 mg total) by mouth daily for 7 days, THEN 2 capsules (20 mg total) daily.    Dispense:  67 capsule    Refill:  0  . lisinopril (ZESTRIL) 10 MG tablet    Sig: Take 1 tablet (10 mg total) by mouth daily.    Dispense:  90 tablet    Refill:  1  . fexofenadine (ALLEGRA) 180 MG tablet    Sig: Take 1 tablet (180 mg total) by mouth daily.    Dispense:  90 tablet    Refill:  1      Follow up plan: Return in about 3 weeks (around 03/11/2020) for Anxiety & bloating f/u.  Harlin Rain, FNP-C Family Nurse Practitioner [facility] 02/19/2020, 8:46 AM

## 2020-02-19 NOTE — Assessment & Plan Note (Signed)
Stable and well controlled hypertension.  BP is at goal < 130/80.  Pt is working on lifestyle modifications.  Taking medications tolerating well without side effects. No known complications.  Plan: 1. Continue taking lisinopril 10mg  daily 2. Obtain labs ordered today  3. Encouraged heart healthy diet and increasing exercise to 30 minutes most days of the week, going no more than 2 days in a row without exercise. 4. Check BP 1-2 x per week at home, keep log, and bring to clinic at next appointment. 5. Follow up 6 months.

## 2020-02-19 NOTE — Patient Instructions (Addendum)
Continue your medications as directed.  As we discussed, have your labs drawn in the next 1-2 weeks and we will contact you with the results.   Can check out "The Plant Paradox" by Dr. Therisa Doyne and "Auto Immune Protocol" by Dr. Levin Bacon to look at GI fodmap's and bloating.  The following recommendations are helpful adjuncts for helping rebalance your mood.  Eat a nourishing diet. Ensure adequate intake of calories, protein, carbs, fat, vitamins, and minerals. Prioritize whole foods at each meal, including meats, vegetables, fruits, nuts and seeds, etc.   Avoid inflammatory and/or "junk" foods, such as sugar, omega-6 fats, refined grains, chemicals, and preservatives are common in packaged and prepared foods. Minimize or completely avoid these ingredients and stick to whole foods with little to no additives. Cook from scratch as much as possible for more control over what you eat  Get enough sleep. Poor sleep is significantly associated with depression and anxiety. Make 7-9 hours of sleep nightly a top priority  Exercise appropriately. Exercise is known to improve brain functioning and boost mood. Aim for 30 minutes of daily physical activity. Avoid "overtraining," which can cause mental disturbances  Assess your light exposure. Not enough natural light during the day and too much artificial light can have a major impact on your mood. Get outside as often as possible during daylight hours. Minimize light exposure after dark and avoid the use of electronics that give off blue light before bed  Manage your stress.  Use daily stress management techniques such as meditation, yoga, or mindfulness to retrain your brain to respond differently to stress. Try deep breathing to deactivate your "fight or flight" response.  There are many of sources with apps like Headspace, Calm or a variety of YouTube videos (videos from Gwynne Edinger have guided meditation)  Prioritize your social life.  Work on building social support with new friends or improve current relationships. Consider getting a pet that allows for companionship, social interaction, and physical touch. Try volunteering or joining a faith-based community to increase your sense of purpose  4-7-8 breathing technique at bedtime: breathe in to count of 4, hold breath for count of 7, exhale for count of 8; do 3-5 times for letting go of overactive thoughts  Take time to play Unstructured "play" time can help reduce anxiety and depression Options for play include music, games, sports, dance, art, etc.  Try to add daily omega 3 fatty acids, magnesium, B complex, and balanced amino acid supplements to help improve mood and anxiety.  Sleep hygiene is the single most effective treatment for sleep issues, but it is hard work.  Tips for a good night's sleep:  -Keep sleep environment comfortable and conducive to sleep -Keep regular sleep schedule 7 nights a week -Avoiding naps during the day -Avoiding going to bed until drowsy and ready to sleep, not trying to sleep, and not watching the clock -Get out of bed if not asleep within 15-20 minutes and returning only when drowsy -Avoiding caffeine, nicotine, alcohol, and other substances that interfere with sleep before bedtime -Take an hour before your set bedtime and start to wind down: bath/shower, no more TV or phone (the blue light can interfere with sleeping), listen to soothing music, or meditation -No TV in your bedroom -Exercising regularly, at least 6 hours before sleep. Yoga and Tai Chi can improve sleep quality  There are a lot of books and apps that may help guide you with any of the following:   -Progressive  muscle relaxation (involves methodical tension and relaxation of different Muscle groups throughout body)  Guided imagery  -YouTube - Gwynne Edinger has free videos on YouTube that can help with meditation and some   Abdominal breathing   Over the counter  sleep aid one hour before bed- and gradually wean your use over 2-4 weeks  Some examples are : *Melatonin 5-10 mg *Sleepology (Can find on [employer]) taken according to packaging directions  There are a few online evidence based online programs, unfortunately they are not free.   Developed by a sleep expert who created a drug-free program for insomnia proven more effective than sleeping pills.  www.cbtforinsomnia.com Sleepio is an evidence-based digital sleep improvement program   www.sleepio.com SHUTi is designed to actively help retrain your body and mind for great sleep through six engaging Cognitive Behavioral Therapy for Insomnia strategy and learning sessions  BloggerCourse.com   We will plan to see you back in 6 months for follow up on hypertension and 3 weeks for follow up on anxiety  You will receive a survey after today's visit either digitally by e-mail or paper by [employer]. Your experiences and feedback matter to [place].  Please respond so we know how we are doing as we provide care for you.  Call us with any questions/concerns/needs.  It is my goal to be available to you for your health concerns.  Thanks for choosing me to be a partner in your healthcare needs!  Harlin Rain, FNP-C Family Nurse Practitioner [facility] Phone: [phone]

## 2020-02-20 ENCOUNTER — Telehealth: Payer: Self-pay | Admitting: Family Medicine

## 2020-02-20 ENCOUNTER — Telehealth: Payer: Self-pay

## 2020-02-20 ENCOUNTER — Encounter: Payer: Self-pay | Admitting: Family Medicine

## 2020-02-20 LAB — LIPID PANEL
Cholesterol: 184 mg/dL (ref <200)
HDL: 47 mg/dL — ABNORMAL LOW (ref >=50)
LDL Cholesterol (Calc): 111 mg/dL (calc) — ABNORMAL HIGH
Non-HDL Cholesterol (Calc): 137 mg/dL (calc) — ABNORMAL HIGH (ref <130)
Total CHOL/HDL Ratio: 3.9 (calc) (ref <5.0)
Triglycerides: 147 mg/dL (ref <150)

## 2020-02-20 LAB — THYROID PANEL WITH TSH
Free Thyroxine Index: 2.5 (ref 1.4–3.8)
T3 Uptake: 33 % (ref 22–35)
T4, Total: 7.7 ug/dL (ref 5.1–11.9)
TSH: 2.77 mIU/L

## 2020-02-20 LAB — URINALYSIS, MICROSCOPIC ONLY
Bacteria, UA: NONE SEEN /HPF
Hyaline Cast: NONE SEEN /LPF
WBC, UA: NONE SEEN /HPF (ref 0–5)

## 2020-02-20 NOTE — Telephone Encounter (Signed)
Clarification of the direction for the Prozac was given to the pharmacist.

## 2020-02-20 NOTE — Telephone Encounter (Signed)
FLUoxetine (PROZAC) 20 MG capsule 30 capsule 0 02/19/2020    Sig: Please specify directions, refills and quantity   Sent to pharmacy as: FLUoxetine (PROZAC) 20 MG capsule   E-Prescribing Status: Receipt confirmed by pharmacy (02/19/2020 11:58 AM EDT)    Pt has called in and this was sent yesterday but the RX has no instructions on how to dispense. Please FU with directions. "Incomplete refill request"

## 2020-02-20 NOTE — Telephone Encounter (Signed)
Copied from CRM [id number]. Topic: General - Other >> Feb 19, 2020 12:17 PM Dalphine Handing A wrote: Patient stated that pharmacy is trying to get in touch with office to Please specify directions, refills and quantity on medication sent over

## 2020-02-20 NOTE — Telephone Encounter (Signed)
Copied from CRM [id number]. Topic: General - Inquiry >> Feb 20, 2020  9:36 AM Deborha Payment wrote: Reason for CRM: Patient had an appt yesterday in office, patient states she was directly exposed to covid yesterday morning.  Call back [phone]     Attempted to contact the patient to f/u from recent message about exposure to COVID.  To find out if she have been test? Also when was the exposure? Was it someone she was around without proper PPE? Was it a person who was confirmed positive? Do you need test site information?

## 2020-02-20 NOTE — Telephone Encounter (Signed)
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 She denies symptoms and state that her children doesn't have any symptoms as well. She is scheduled for testing and will give [place] a call when she gets the results. She called [place] to notify us because she possibly exposed [place].

## 2020-02-26 ENCOUNTER — Ambulatory Visit: Payer: BC Managed Care – PPO | Attending: Internal Medicine

## 2020-02-26 DIAGNOSIS — Z20822 Contact with and (suspected) exposure to covid-19: Secondary | ICD-10-CM

## 2020-02-27 LAB — SARS-COV-2, NAA 2 DAY TAT

## 2020-02-27 LAB — NOVEL CORONAVIRUS, NAA: SARS-CoV-2, NAA: NOT DETECTED

## 2020-03-08 ENCOUNTER — Ambulatory Visit (INDEPENDENT_AMBULATORY_CARE_PROVIDER_SITE_OTHER): Payer: BC Managed Care – PPO | Admitting: Obstetrics and Gynecology

## 2020-03-08 ENCOUNTER — Other Ambulatory Visit: Payer: Self-pay

## 2020-03-08 ENCOUNTER — Encounter: Payer: Self-pay | Admitting: Obstetrics and Gynecology

## 2020-03-08 VITALS — BP 104/66 | HR 78 | Ht 66.0 in | Wt 218.0 lb

## 2020-03-08 DIAGNOSIS — Z4889 Encounter for other specified surgical aftercare: Secondary | ICD-10-CM

## 2020-03-08 MED ORDER — NYSTATIN 100000 UNIT/GM EX CREA: 1.0000 "application " | TOPICAL_CREAM | Freq: Two times a day (BID) | CUTANEOUS | 1 refills | Status: DC

## 2020-03-08 NOTE — Progress Notes (Signed)
      Postoperative Follow-up Patient presents post op from TLH, BS, cystoscopy 1weeks ago for abnormal uterine bleeding.  Subjective: Patient reports marked improvement in her preop symptoms. Eating a regular diet without difficulty. The patient is not having any pain.  Activity: normal activities of daily living.  Objective: Blood pressure 104/66, pulse 78, height 5\' 6"  (1.676 m), weight 218 lb (98.9 kg), last menstrual period 11/20/2019.  General: NAD Pulmonary: no increased work of breathing Abdomen: soft, non-tender, non-distended, incision(s) D/C/I GU: normal external female genitalia vaginal cuff intact, well healed Extremities: no edema Neurologic: normal gait    Lab on 02/26/2020  Component Date Value Ref Range Status  . SARS-CoV-2, NAA 02/26/2020 Not Detected  Not Detected Final   Comment: This nucleic acid amplification test was developed and its performance characteristics determined by 02/28/2020. Nucleic acid amplification tests include RT-PCR and TMA. This test has not been FDA cleared or approved. This test has been authorized by FDA under an Emergency Use Authorization (EUA). This test is only authorized for the duration of time the declaration that circumstances exist justifying the authorization of the emergency use of in vitro diagnostic tests for detection of SARS-CoV-2 virus and/or diagnosis of COVID-19 infection under section 564(b)(1) of the Act, 21 U.S.C. [employer]) (1), unless the authorization is terminated or revoked sooner. When diagnostic testing is negative, the possibility of a false negative result should be considered in the context of a patient's recent exposures and the presence of clinical signs and symptoms consistent with COVID-19. An individual without symptoms of COVID-19 and who is not shedding SARS-CoV-2 virus wo                          uld expect to have a negative (not detected) result in this assay.   [id number](P SARS-CoV-2,  NAA 2 DAY TAT 02/26/2020 Performed   Final    Assessment: 34 y.o. s/p TLH, BS, cystoscopy stable  Plan: Patient has done well after surgery with no apparent complications.  I have discussed the post-operative course to date, and the expected progress moving forward.  The patient understands what complications to be concerned about.  I will see the patient in routine follow up, or sooner if needed.    Activity plan: No restriction.   31, MD, Vena Austria OB/GYN, [facility] 03/08/2020, 8:34 AM

## 2020-03-12 ENCOUNTER — Telehealth (INDEPENDENT_AMBULATORY_CARE_PROVIDER_SITE_OTHER): Payer: BC Managed Care – PPO | Admitting: Family Medicine

## 2020-03-12 ENCOUNTER — Encounter: Payer: Self-pay | Admitting: Family Medicine

## 2020-03-12 ENCOUNTER — Other Ambulatory Visit: Payer: Self-pay

## 2020-03-12 DIAGNOSIS — F5104 Psychophysiologic insomnia: Secondary | ICD-10-CM

## 2020-03-12 DIAGNOSIS — R140 Abdominal distension (gaseous): Secondary | ICD-10-CM

## 2020-03-12 DIAGNOSIS — F419 Anxiety disorder, unspecified: Secondary | ICD-10-CM | POA: Diagnosis not present

## 2020-03-12 MED ORDER — TRAZODONE HCL 50 MG PO TABS: 50.0000 mg | ORAL_TABLET | Freq: Every day | ORAL | 1 refills | Status: DC

## 2020-03-12 MED ORDER — FLUOXETINE HCL 20 MG PO CAPS: 20.0000 mg | ORAL_CAPSULE | Freq: Every day | ORAL | 1 refills | Status: DC

## 2020-03-12 NOTE — Assessment & Plan Note (Signed)
Reports since meeting 4 weeks ago has had an improvement in her bloating symptoms, and denies any concerns for bloating at this time.  Plan: 1. Follow up as needed

## 2020-03-12 NOTE — Progress Notes (Signed)
Virtual Visit via Telephone  The purpose of this virtual visit is to provide medical care while limiting exposure to the novel coronavirus (COVID19) for both patient and office staff.  Consent was obtained for phone visit:  Yes.   Answered questions that patient had about telehealth interaction:  Yes.   I discussed the limitations, risks, security and privacy concerns of performing an evaluation and management service by telephone. I also discussed with the patient that there may be a patient responsible charge related to this service. The patient expressed understanding and agreed to proceed.  Patient is at home and is accessed via telephone Services are provided by Charlaine Dalton, FNP-C from [facility])  ---------------------------------------------------------------------- Chief Complaint  Patient presents with  . Anxiety    S: Reviewed CMA documentation. I have called patient and gathered additional HPI as follows:  Stacey Huynh presents via telemedicine for a follow up on her anxiety and bloating from her last visit 4 weeks ago.  Has been taking the fluoxetine 20mg  daily and reports a decrease in her anxiety and depression, reports sleeping well and tolerating medication without side effects today.  Reports that her bloating concerns have gotten much better since her last visit of 4 weeks ago and has no acute concerns today.  Patient is currently working  Denies any high risk travel to areas of current concern for COVID19. Denies any known or suspected exposure to person with or possibly with COVID19.  Denies any fevers, chills, sweats, body ache, cough, shortness of breath, sinus pain or pressure, headache, abdominal pain, diarrhea  Past Medical History:  Diagnosis Date  . Abnormal Pap smear   . Abnormal Pap smear of cervix   . Anxiety   . Hypertension    Social History   Tobacco Use  . Smoking status: Never Smoker  . Smokeless tobacco: Never  Used  Substance Use Topics  . Alcohol use: Yes    Comment: rare  . Drug use: No    Current Outpatient Medications:  .  fexofenadine (ALLEGRA) 180 MG tablet, Take 1 tablet (180 mg total) by mouth daily., Disp: 90 tablet, Rfl: 1 .  FLUoxetine (PROZAC) 20 MG capsule, Take 1 capsule (20 mg total) by mouth daily., Disp: 90 capsule, Rfl: 1 .  lisinopril (ZESTRIL) 10 MG tablet, Take 1 tablet (10 mg total) by mouth daily., Disp: 90 tablet, Rfl: 1 .  nystatin cream (MYCOSTATIN), Apply 1 application topically 2 (two) times daily., Disp: 30 g, Rfl: 1 .  traZODone (DESYREL) 50 MG tablet, Take 1 tablet (50 mg total) by mouth at bedtime., Disp: 90 tablet, Rfl: 0  Depression screen [facility] 2/9 03/12/2020 06/16/2019 12/08/2018  Decreased Interest 0 0 0  Down, Depressed, Hopeless 0 0 0  PHQ - 2 Score 0 0 0  Altered sleeping 0 3 -  Tired, decreased energy 0 1 -  Change in appetite 0 0 -  Feeling bad or failure about yourself  0 0 -  Trouble concentrating 0 0 -  Moving slowly or fidgety/restless 0 0 -  Suicidal thoughts 0 0 -  PHQ-9 Score 0 4 -  Difficult doing work/chores Not difficult at all Not difficult at all -    GAD 7 : Generalized Anxiety Score 03/12/2020  Nervous, Anxious, on Edge 0  Control/stop worrying 0  Worry too much - different things 0  Trouble relaxing 0  Restless 0  Easily annoyed or irritable 0  Afraid - awful might happen 0  Total GAD 7 Score 0  Anxiety Difficulty Not difficult at all    -------------------------------------------------------------------------- O: No physical exam performed due to remote telephone encounter.  Physical Exam: Patient remotely monitored without video.  Verbal communication appropriate.  Cognition normal.  Recent Results (from the past 2160 hour(s))  Basic metabolic panel     Status: Abnormal   Collection Time: 01/15/20  8:49 AM  Result Value Ref Range   Sodium 139 135 - 145 mmol/L   Potassium 4.3 3.5 - 5.1 mmol/L   Chloride 106 98 - 111  mmol/L   CO2 25 22 - 32 mmol/L   Glucose, Bld 102 (H) 70 - 99 mg/dL    Comment: Glucose reference range applies only to samples taken after fasting for at least 8 hours.   BUN 13 6 - 20 mg/dL   Creatinine, Ser 0.75 0.44 - 1.00 mg/dL   Calcium 9.0 8.9 - 10.3 mg/dL   GFR calc non Af Amer >60 >60 mL/min   GFR calc Af Amer >60 >60 mL/min   Anion gap 8 5 - 15    Comment: Performed at [facility], [address] [place], [place] [address]  CBC     Status: None   Collection Time: 01/15/20  8:49 AM  Result Value Ref Range   WBC 7.4 4.0 - 10.5 K/uL   RBC 4.37 3.87 - 5.11 MIL/uL   Hemoglobin 13.2 12.0 - 15.0 g/dL   HCT 39.7 36.0 - 46.0 %   MCV 90.8 80.0 - 100.0 fL   MCH 30.2 26.0 - 34.0 pg   MCHC 33.2 30.0 - 36.0 g/dL   RDW 12.7 11.5 - 15.5 %   Platelets 347 150 - 400 K/uL   nRBC 0.0 0.0 - 0.2 %    Comment: Performed at [facility], [address] [place], [place] [address]  Type and screen [place]     Status: None   Collection Time: 01/15/20  8:49 AM  Result Value Ref Range   ABO/RH(D) A POS    Antibody Screen NEG    Sample Expiration 01/29/2020,2359    Extend sample reason      NO TRANSFUSIONS OR PREGNANCY IN THE PAST 3 MONTHS Performed at [facility], [address] [place], [place] [address]   SARS CORONAVIRUS 2 (TAT 6-24 HRS) Nasopharyngeal Nasopharyngeal Swab     Status: None   Collection Time: 01/23/20  9:14 AM   Specimen: Nasopharyngeal Swab  Result Value Ref Range   SARS Coronavirus 2 NEGATIVE NEGATIVE    Comment: (NOTE) SARS-CoV-2 target nucleic acids are NOT DETECTED. The SARS-CoV-2 RNA is generally detectable in upper and lower respiratory specimens during the acute phase of infection. Negative results do not preclude SARS-CoV-2 infection, do not rule out co-infections with other pathogens, and should not be used as the sole basis for treatment or other patient management decisions. Negative results must be  combined with clinical observations, patient history, and epidemiological information. The expected result is Negative. Fact Sheet for Patients: SugarRoll.be Fact Sheet for Healthcare Providers: https://www.woods-mathews.com/ This test is not yet approved or cleared by the [place] FDA and  has been authorized for detection and/or diagnosis of SARS-CoV-2 by FDA under an Emergency Use Authorization (EUA). This EUA will remain  in effect (meaning this test can be used) for the duration of the COVID-19 declaration under Section 56 4(b)(1) of the Act, 21 U.S.C. section 360bbb-3(b)(1), unless the authorization is terminated or revoked sooner. Performed  at [facility], [address] [place], [place] [address]   Pregnancy, urine POC     Status: None   Collection Time: 01/25/20  8:28 AM  Result Value Ref Range   Preg Test, Ur NEGATIVE NEGATIVE    Comment:        THE SENSITIVITY OF THIS METHODOLOGY IS >24 mIU/mL   Surgical pathology     Status: None   Collection Time: 01/25/20 10:55 AM  Result Value Ref Range   SURGICAL PATHOLOGY      SURGICAL PATHOLOGY CASE: [id number] PATIENT: [name] Surgical Pathology Report     Specimen Submitted: A. Uterus, cervix, bilateral tubes  Clinical History: Abnormal uterine bleeding N93.9     DIAGNOSIS: A. UTERUS WITH CERVIX; HYSTERECTOMY: - CERVIX WITHOUT PATHOLOGIC CHANGES. - WEAKLY PROLIFERATIVE ENDOMETRIUM. - MYOMETRIUM WITHOUT PATHOLOGIC CHANGES.  BILATERAL FALLOPIAN TUBES; SALPINGECTOMY: - NO PATHOLOGIC CHANGES.  GROSS DESCRIPTION: A. Labeled: Uterus with cervix and bilateral tubes Received: Formalin Weight: 56 grams Dimensions:      Fundus - 5.0 x 4.5 x 3.3 cm      Cervix - 2.7 x 2.7 x 2.4 cm Serosa: Tan and smooth Cervix: The ectocervical mucosa is pale-tan, smooth, and grossly unremarkable.  The external os is 0.9 cm in diameter, patent, and slit-like  shaped. Endocervix: 2.4 cm in length by 0.8 cm in diameter.  The endocervical mucosa is tan, striated, and grossly unremarkable. Endometrial cavity:      Dimensions - 3.4 cm in l ength by 1.8 cm cornu to cornu width      Thickness - 0.1 cm      Other findings - The endometrium is tan and grossly unremarkable. No polyps or abnormalities are grossly identified. Myometrium:     Thickness - 1.5 cm (average)     Other findings - Sectioning displays tan, grossly unremarkable myometrium.  No nodules or abnormalities are grossly identified. Adnexa:      Right fallopian tube           Measurements - 5.9 cm in length x 0.5 cm in diameter           Other findings - Fimbriated.  The external surface is tan-purple, smooth, and finely vascular.  Sectioning displays a pinpoint, grossly unremarkable lumen.      Left fallopian tube            Measurements - 5.9 cm in length x 0.5 cm in diameter           Other findings - Fimbriated.  The external surface is tan-purple, smooth, and finely vascular.  Sectioning displays a pinpoint, grossly unremarkable lumen.  Block summary: 1 - anterior cervix 2 - posterior cervix 3 - anterior endomyometrium 4 - posterior  endomyometrium 5 - right fallopian tube with longitudinally sectioned fimbria and cross-sections 6 - left fallopian tube with longitudinally sectioned fimbria and cross-sections   Final Diagnosis performed by Ronald Lobo, MD.   Electronically signed 01/29/2020 4:25:53PM The electronic signature indicates that the named Attending Pathologist has evaluated the specimen Technical component performed at [facility], [address], [place], [place] [address] Lab: [phone] Dir: Jolene Schimke, MD, MMM  Professional component performed at [facility], [facility], [address] [place], [place], [place] [address] Lab: [phone] Dir: Georgiann Cocker. Rubinas, MD   CBC     Status: Abnormal   Collection Time: 01/26/20  6:19 AM  Result Value Ref  Range   WBC 12.1 (H) 4.0 - 10.5 K/uL   RBC 3.94  3.87 - 5.11 MIL/uL   Hemoglobin 12.2 12.0 - 15.0 g/dL   HCT 16.136.2 09.636.0 - 04.546.0 %   MCV 91.9 80.0 - 100.0 fL   MCH 31.0 26.0 - 34.0 pg   MCHC 33.7 30.0 - 36.0 g/dL   RDW 40.912.4 81.111.5 - 91.415.5 %   Platelets 279 150 - 400 K/uL   nRBC 0.0 0.0 - 0.2 %    Comment: Performed at [facility], [address] [place], [place] [address]  Basic metabolic panel     Status: Abnormal   Collection Time: 01/26/20  6:19 AM  Result Value Ref Range   Sodium 137 135 - 145 mmol/L   Potassium 4.1 3.5 - 5.1 mmol/L   Chloride 105 98 - 111 mmol/L   CO2 24 22 - 32 mmol/L   Glucose, Bld 123 (H) 70 - 99 mg/dL    Comment: Glucose reference range applies only to samples taken after fasting for at least 8 hours.   BUN 12 6 - 20 mg/dL   Creatinine, Ser 6.210.77 0.44 - 1.00 mg/dL   Calcium 8.6 (L) 8.9 - 10.3 mg/dL   GFR calc non Af Amer >60 >60 mL/min   GFR calc Af Amer >60 >60 mL/min   Anion gap 8 5 - 15    Comment: Performed at [facility], [address] [place], [place] [address]  POCT glycosylated hemoglobin (Hb A1C)     Status: Normal   Collection Time: 02/19/20  8:31 AM  Result Value Ref Range   Hemoglobin A1C 5.6 4.0 - 5.6 %   HbA1c POC (<> result, manual entry)     HbA1c, POC (prediabetic range)     HbA1c, POC (controlled diabetic range)    Lipid Profile     Status: Abnormal   Collection Time: 02/19/20  8:39 AM  Result Value Ref Range   Cholesterol 184 <200 mg/dL   HDL 47 (L) > OR = 50 mg/dL   Triglycerides 784147 <696<150 mg/dL   LDL Cholesterol (Calc) 111 (H) mg/dL (calc)    Comment: Reference range: <100 . Desirable range <100 mg/dL for primary prevention;   <70 mg/dL for patients with CHD or diabetic patients  with > or = 2 CHD risk factors. Marland Kitchen. LDL-C is now calculated using the Martin-Hopkins  calculation, which is a validated novel method providing  better accuracy than the Friedewald equation in the  estimation of LDL-C.  Horald PollenMartin SS  et al. Lenox AhrJAMA. 2952;841(322013;310(19): 2061-2068  (http://education.QuestDiagnostics.com/faq/FAQ164)    Total CHOL/HDL Ratio 3.9 <5.0 (calc)   Non-HDL Cholesterol (Calc) 137 (H) <130 mg/dL (calc)    Comment: For patients with diabetes plus 1 major ASCVD risk  factor, treating to a non-HDL-C goal of <100 mg/dL  (LDL-C of <44<70 mg/dL) is considered a therapeutic  option.   Thyroid Panel With TSH     Status: None   Collection Time: 02/19/20  8:39 AM  Result Value Ref Range   T3 Uptake 33 22 - 35 %   T4, Total 7.7 5.1 - 11.9 mcg/dL   Free Thyroxine Index 2.5 1.4 - 3.8   TSH 2.77 mIU/L    Comment:           Reference Range .           > or = 20 Years  0.40-4.50 .                Pregnancy Ranges           First  trimester    0.26-2.66           Second trimester   0.55-2.73           Third trimester    0.43-2.91   Urinalysis, microscopic only     Status: None   Collection Time: 02/19/20  8:56 AM  Result Value Ref Range   WBC, UA NONE SEEN 0 - 5 /HPF   RBC / HPF 0-2 0 - 2 /HPF   Squamous Epithelial / LPF 0-5 < OR = 5 /HPF   Bacteria, UA NONE SEEN NONE SEEN /HPF   Hyaline Cast NONE SEEN NONE SEEN /LPF  Novel Coronavirus, NAA ([facility])     Status: None   Collection Time: 02/26/20  8:50 AM   Specimen: Nasopharyngeal(NP) swabs in vial transport medium   NASOPHARYNGE  TESTING  Result Value Ref Range   SARS-CoV-2, NAA Not Detected Not Detected    Comment: This nucleic acid amplification test was developed and its performance characteristics determined by [employer]. Nucleic acid amplification tests include RT-PCR and TMA. This test has not been FDA cleared or approved. This test has been authorized by FDA under an Emergency Use Authorization (EUA). This test is only authorized for the duration of time the declaration that circumstances exist justifying the authorization of the emergency use of in vitro diagnostic tests for detection of SARS-CoV-2 virus and/or diagnosis of COVID-19  infection under section 564(b)(1) of the Act, 21 U.S.C. 371GGY-6(R) (1), unless the authorization is terminated or revoked sooner. When diagnostic testing is negative, the possibility of a false negative result should be considered in the context of a patient's recent exposures and the presence of clinical signs and symptoms consistent with COVID-19. An individual without symptoms of COVID-19 and who is not shedding SARS-CoV-2 virus wo uld expect to have a negative (not detected) result in this assay.   SARS-COV-2, NAA 2 DAY TAT     Status: None   Collection Time: 02/26/20  8:50 AM   NASOPHARYNGE  TESTING  Result Value Ref Range   SARS-CoV-2, NAA 2 DAY TAT Performed     -------------------------------------------------------------------------- A&P:  Problem List Items Addressed This Visit      Other   Anxiety    Currently well controlled and stable anxiety with beginning fluoxetine 20mg .  Reports improvement in anxiety/depression and better sleep.  Denies any side effects or inability to tolerate medications.  Plan: 1. Continue fluoxetine 20mg  daily 2. Continue sleep hygiene routine 3. Follow up in 6 months.      Relevant Medications   FLUoxetine (PROZAC) 20 MG capsule   Bloating - Primary    Reports since meeting 4 weeks ago has had an improvement in her bloating symptoms, and denies any concerns for bloating at this time.  Plan: 1. Follow up as needed         Meds ordered this encounter  Medications  . FLUoxetine (PROZAC) 20 MG capsule    Sig: Take 1 capsule (20 mg total) by mouth daily.    Dispense:  90 capsule    Refill:  1    Follow-up: - Return in 6 months for anxiety and hypertension follow up  Patient verbalizes understanding with the above medical recommendations including the limitation of remote medical advice.  Specific follow-up and call-back criteria were given for patient to follow-up or seek medical care more urgently if needed.   - Time  spent in direct consultation with patient on phone: 5 minutes  ,  FNP-C [facility] 03/12/2020, 9:03 AM

## 2020-03-12 NOTE — Patient Instructions (Signed)
Continue fluoxetine 22m daily and medication refills sent to pharmacy on file. I have added additional information below about helping to balance mood and for sleep hygiene.  The following recommendations are helpful adjuncts for helping rebalance your mood.  Eat a nourishing diet. Ensure adequate intake of calories, protein, carbs, fat, vitamins, and minerals. Prioritize whole foods at each meal, including meats, vegetables, fruits, nuts and seeds, etc.   Avoid inflammatory and/or "junk" foods, such as sugar, omega-6 fats, refined grains, chemicals, and preservatives are common in packaged and prepared foods. Minimize or completely avoid these ingredients and stick to whole foods with little to no additives. Cook from scratch as much as possible for more control over what you eat  Get enough sleep. Poor sleep is significantly associated with depression and anxiety. Make 7-9 hours of sleep nightly a top priority  Exercise appropriately. Exercise is known to improve brain functioning and boost mood. Aim for 30 minutes of daily physical activity. Avoid "overtraining," which can cause mental disturbances  Assess your light exposure. Not enough natural light during the day and too much artificial light can have a major impact on your mood. Get outside as often as possible during daylight hours. Minimize light exposure after dark and avoid the use of electronics that give off blue light before bed  Manage your stress.  Use daily stress management techniques such as meditation, yoga, or mindfulness to retrain your brain to respond differently to stress. Try deep breathing to deactivate your "fight or flight" response.  There are many of sources with apps like Headspace, Calm or a variety of YouTube videos (videos from JGwynne Edingerhave guided meditation)  Prioritize your social life. Work on building social support with new friends or improve current relationships. Consider getting a pet that allows  for companionship, social interaction, and physical touch. Try volunteering or joining a faith-based community to increase your sense of purpose  4-7-8 breathing technique at bedtime: breathe in to count of 4, hold breath for count of 7, exhale for count of 8; do 3-5 times for letting go of overactive thoughts  Take time to play Unstructured "play" time can help reduce anxiety and depression Options for play include music, games, sports, dance, art, etc.  Try to add daily omega 3 fatty acids, magnesium, B complex, and balanced amino acid supplements to help improve mood and anxiety.  Sleep hygiene is the single most effective treatment for sleep issues, but it is hard work.  Tips for a good night's sleep:  -Keep sleep environment comfortable and conducive to sleep -Keep regular sleep schedule 7 nights a week -Avoiding naps during the day -Avoiding going to bed until drowsy and ready to sleep, not trying to sleep, and not watching the clock -Get out of bed if not asleep within 15-20 minutes and returning only when drowsy -Avoiding caffeine, nicotine, alcohol, and other substances that interfere with sleep before bedtime -Take an hour before your set bedtime and start to wind down: bath/shower, no more TV or phone (the blue light can interfere with sleeping), listen to soothing music, or meditation -No TV in your bedroom -Exercising regularly, at least 6 hours before sleep. Yoga and Tai Chi can improve sleep quality  There are a lot of books and apps that may help guide you with any of the following:   -Progressive muscle relaxation (involves methodical tension and relaxation of different Muscle groups throughout body)  Guided imagery  -YouTube - JGwynne Edingerhas free videos on YouTube  that can help with meditation and some   Abdominal breathing   Over the counter sleep aid one hour before bed- and gradually wean your use over 2-4 weeks  Some examples are : *Melatonin 5-10  mg *Sleepology (Can find on [employer]) taken according to packaging directions  There are a few online evidence based online programs, unfortunately they are not free.   Developed by a sleep expert who created a drug-free program for insomnia proven more effective than sleeping pills.  www.cbtforinsomnia.com Sleepio is an evidence-based digital sleep improvement program   www.sleepio.com SHUTi is designed to actively help retrain your body and mind for great sleep through six engaging Cognitive Behavioral Therapy for Insomnia strategy and learning sessions  BloggerCourse.com  We will plan to see you back in 6 months for anxiety and hypertension follow up  You will receive a survey after today's visit either digitally by e-mail or paper by [employer]. Your experiences and feedback matter to [place].  Please respond so we know how we are doing as we provide care for you.  Call us with any questions/concerns/needs.  It is my goal to be available to you for your health concerns.  Thanks for choosing me to be a partner in your healthcare needs!  Harlin Rain, FNP-C Family Nurse Practitioner [facility] Phone: [phone]

## 2020-03-12 NOTE — Assessment & Plan Note (Signed)
Currently well controlled and stable anxiety with beginning fluoxetine 20mg .  Reports improvement in anxiety/depression and better sleep.  Denies any side effects or inability to tolerate medications.  Plan: 1. Continue fluoxetine 20mg  daily 2. Continue sleep hygiene routine 3. Follow up in 6 months.

## 2020-04-09 ENCOUNTER — Other Ambulatory Visit: Payer: Self-pay | Admitting: Certified Nurse Midwife

## 2020-04-09 ENCOUNTER — Encounter: Payer: Self-pay | Admitting: Family Medicine

## 2020-04-09 ENCOUNTER — Ambulatory Visit (INDEPENDENT_AMBULATORY_CARE_PROVIDER_SITE_OTHER): Payer: BC Managed Care – PPO

## 2020-04-09 ENCOUNTER — Telehealth: Payer: Self-pay | Admitting: Certified Nurse Midwife

## 2020-04-09 ENCOUNTER — Other Ambulatory Visit: Payer: Self-pay

## 2020-04-09 ENCOUNTER — Emergency Department
Admission: EM | Admit: 2020-04-09 | Discharge: 2020-04-09 | Disposition: A | Discharge status: Home / Self Care | Payer: BC Managed Care – PPO | Attending: Emergency Medicine | Admitting: Emergency Medicine

## 2020-04-09 DIAGNOSIS — Z5321 Procedure and treatment not carried out due to patient leaving prior to being seen by health care provider: Secondary | ICD-10-CM | POA: Insufficient documentation

## 2020-04-09 DIAGNOSIS — R350 Frequency of micturition: Secondary | ICD-10-CM

## 2020-04-09 DIAGNOSIS — R300 Dysuria: Secondary | ICD-10-CM

## 2020-04-09 DIAGNOSIS — R310 Gross hematuria: Secondary | ICD-10-CM

## 2020-04-09 DIAGNOSIS — N939 Abnormal uterine and vaginal bleeding, unspecified: Secondary | ICD-10-CM | POA: Diagnosis not present

## 2020-04-09 LAB — COMPREHENSIVE METABOLIC PANEL
ALT: 20 U/L (ref 0–44)
AST: 17 U/L (ref 15–41)
Albumin: 4 g/dL (ref 3.5–5.0)
Alkaline Phosphatase: 67 U/L (ref 38–126)
Anion gap: 11 (ref 5–15)
BUN: 14 mg/dL (ref 6–20)
CO2: 24 mmol/L (ref 22–32)
Calcium: 9.3 mg/dL (ref 8.9–10.3)
Chloride: 102 mmol/L (ref 98–111)
Creatinine, Ser: 0.82 mg/dL (ref 0.44–1.00)
GFR calc Af Amer: >60 mL/min (ref >60)
GFR calc non Af Amer: >60 mL/min (ref >60)
Glucose, Bld: 104 mg/dL — ABNORMAL HIGH (ref 70–99)
Potassium: 3.8 mmol/L (ref 3.5–5.1)
Sodium: 137 mmol/L (ref 135–145)
Total Bilirubin: 0.6 mg/dL (ref 0.3–1.2)
Total Protein: 7.5 g/dL (ref 6.5–8.1)

## 2020-04-09 LAB — CBC
HCT: 40.6 % (ref 36.0–46.0)
Hemoglobin: 14.1 g/dL (ref 12.0–15.0)
MCH: 30.9 pg (ref 26.0–34.0)
MCHC: 34.7 g/dL (ref 30.0–36.0)
MCV: 89 fL (ref 80.0–100.0)
Platelets: 324 10*3/uL (ref 150–400)
RBC: 4.56 MIL/uL (ref 3.87–5.11)
RDW: 12.3 % (ref 11.5–15.5)
WBC: 11.1 10*3/uL — ABNORMAL HIGH (ref 4.0–10.5)
nRBC: 0 % (ref 0.0–0.2)

## 2020-04-09 LAB — URINALYSIS, COMPLETE (UACMP) WITH MICROSCOPIC
Bilirubin Urine: NEGATIVE
Glucose, UA: NEGATIVE mg/dL
Ketones, ur: NEGATIVE mg/dL
Nitrite: NEGATIVE
Protein, ur: NEGATIVE mg/dL
Specific Gravity, Urine: 1.011 (ref 1.005–1.030)
pH: 6 (ref 5.0–8.0)

## 2020-04-09 LAB — POCT URINALYSIS DIPSTICK
Bilirubin, UA: NEGATIVE
Glucose, UA: NEGATIVE
Ketones, UA: NEGATIVE
Nitrite, UA: POSITIVE
Protein, UA: POSITIVE — AB
Spec Grav, UA: 1.025 (ref 1.010–1.025)
Urobilinogen, UA: NEGATIVE E.U./dL — AB
pH, UA: 6 (ref 5.0–8.0)

## 2020-04-09 LAB — LIPASE, BLOOD: Lipase: 41 U/L (ref 11–51)

## 2020-04-09 MED ORDER — PHENAZOPYRIDINE HCL 200 MG PO TABS
200.0000 mg | ORAL_TABLET | Freq: Three times a day (TID) | ORAL | 0 refills | Status: DC | PRN
Start: 2020-04-09 — End: 2020-06-06

## 2020-04-09 MED ORDER — SODIUM CHLORIDE 0.9% FLUSH: 3.0000 mL | Freq: Once | INTRAVENOUS | Status: DC

## 2020-04-09 MED ORDER — SULFAMETHOXAZOLE-TRIMETHOPRIM 800-160 MG PO TABS: 1.0000 | ORAL_TABLET | Freq: Two times a day (BID) | ORAL | 0 refills | Status: DC

## 2020-04-09 NOTE — Progress Notes (Addendum)
Pt came to drop off a urine sample; c/o burning and frequency.  See u/a results as well.  Pt also c/o to front desk, [facility], she has had a hyst and is bleeding.  Adv [facility] to sched pt an appt.

## 2020-04-09 NOTE — Telephone Encounter (Signed)
Called and spoke with patient about scheduling a UA drop off. Patient is going to call back to be scheduled

## 2020-04-09 NOTE — Telephone Encounter (Signed)
Called patient regarding dysuria, gross hematuria, and  urinary frequency. Was see here by the CMA who did a urine dipstick revealing the following: +3 blood, +1 leukocytes, positive nitrite, +1 protein. She is currently sitting in the ER. States also having pains in lower abdomen. Asked patient if she wanted me to call in RX for antibiotic and to help with burning or if she wished to continue work up at ER. She wanted me to call in RX: Bactrim DS BID x 7days and Pyridium 200 mgm tid prn burning called to [facility] in [place]. Advised if symptoms (particularly the pain) worsen to go back to ER. Farrel Conners, CNM

## 2020-04-09 NOTE — ED Triage Notes (Signed)
Pt to the er for vaginal bleeding 2 months after a hysterectomy. Pt show this RN pictures in triage. Minimal blood noted on toilet paper. Pt is not having to use feminine pads. Pt reports burning with urination.

## 2020-04-10 DIAGNOSIS — R300 Dysuria: Secondary | ICD-10-CM | POA: Diagnosis not present

## 2020-04-10 DIAGNOSIS — R310 Gross hematuria: Secondary | ICD-10-CM | POA: Diagnosis not present

## 2020-04-13 LAB — URINE CULTURE

## 2020-04-16 ENCOUNTER — Telehealth: Payer: Self-pay | Admitting: Certified Nurse Midwife

## 2020-04-16 MED ORDER — SULFAMETHOXAZOLE-TRIMETHOPRIM 800-160 MG PO TABS
1.0000 | ORAL_TABLET | Freq: Two times a day (BID) | ORAL | 0 refills | Status: AC
Start: 2020-04-16 — End: 2020-04-19

## 2020-04-16 NOTE — Telephone Encounter (Signed)
Called patient with urine culture results: 1 pansensitive E. Coli and 2) 10-25K GBS. Still having some mild burning with urination at times. Will extend treatment to 10 days with Bactrim DS. Advised patient to let me know if she continues to have symptoms after completing antibiotics. Farrel Conners, CNM

## 2020-04-22 DIAGNOSIS — M5416 Radiculopathy, lumbar region: Secondary | ICD-10-CM | POA: Diagnosis not present

## 2020-04-22 DIAGNOSIS — M9901 Segmental and somatic dysfunction of cervical region: Secondary | ICD-10-CM | POA: Diagnosis not present

## 2020-04-22 DIAGNOSIS — G43001 Migraine without aura, not intractable, with status migrainosus: Secondary | ICD-10-CM | POA: Diagnosis not present

## 2020-04-22 DIAGNOSIS — M9903 Segmental and somatic dysfunction of lumbar region: Secondary | ICD-10-CM | POA: Diagnosis not present

## 2020-04-23 DIAGNOSIS — M9901 Segmental and somatic dysfunction of cervical region: Secondary | ICD-10-CM | POA: Diagnosis not present

## 2020-04-23 DIAGNOSIS — M5416 Radiculopathy, lumbar region: Secondary | ICD-10-CM | POA: Diagnosis not present

## 2020-04-23 DIAGNOSIS — M9903 Segmental and somatic dysfunction of lumbar region: Secondary | ICD-10-CM | POA: Diagnosis not present

## 2020-04-23 DIAGNOSIS — G43001 Migraine without aura, not intractable, with status migrainosus: Secondary | ICD-10-CM | POA: Diagnosis not present

## 2020-04-24 DIAGNOSIS — M9903 Segmental and somatic dysfunction of lumbar region: Secondary | ICD-10-CM | POA: Diagnosis not present

## 2020-04-24 DIAGNOSIS — M5416 Radiculopathy, lumbar region: Secondary | ICD-10-CM | POA: Diagnosis not present

## 2020-04-24 DIAGNOSIS — G43001 Migraine without aura, not intractable, with status migrainosus: Secondary | ICD-10-CM | POA: Diagnosis not present

## 2020-04-24 DIAGNOSIS — M9901 Segmental and somatic dysfunction of cervical region: Secondary | ICD-10-CM | POA: Diagnosis not present

## 2020-04-26 DIAGNOSIS — M5416 Radiculopathy, lumbar region: Secondary | ICD-10-CM | POA: Diagnosis not present

## 2020-04-26 DIAGNOSIS — M9903 Segmental and somatic dysfunction of lumbar region: Secondary | ICD-10-CM | POA: Diagnosis not present

## 2020-04-26 DIAGNOSIS — G43001 Migraine without aura, not intractable, with status migrainosus: Secondary | ICD-10-CM | POA: Diagnosis not present

## 2020-04-26 DIAGNOSIS — M9901 Segmental and somatic dysfunction of cervical region: Secondary | ICD-10-CM | POA: Diagnosis not present

## 2020-04-30 DIAGNOSIS — M9903 Segmental and somatic dysfunction of lumbar region: Secondary | ICD-10-CM | POA: Diagnosis not present

## 2020-04-30 DIAGNOSIS — G43001 Migraine without aura, not intractable, with status migrainosus: Secondary | ICD-10-CM | POA: Diagnosis not present

## 2020-04-30 DIAGNOSIS — M9901 Segmental and somatic dysfunction of cervical region: Secondary | ICD-10-CM | POA: Diagnosis not present

## 2020-04-30 DIAGNOSIS — M5416 Radiculopathy, lumbar region: Secondary | ICD-10-CM | POA: Diagnosis not present

## 2020-05-01 DIAGNOSIS — M9903 Segmental and somatic dysfunction of lumbar region: Secondary | ICD-10-CM | POA: Diagnosis not present

## 2020-05-01 DIAGNOSIS — M9901 Segmental and somatic dysfunction of cervical region: Secondary | ICD-10-CM | POA: Diagnosis not present

## 2020-05-01 DIAGNOSIS — G43001 Migraine without aura, not intractable, with status migrainosus: Secondary | ICD-10-CM | POA: Diagnosis not present

## 2020-05-01 DIAGNOSIS — M5416 Radiculopathy, lumbar region: Secondary | ICD-10-CM | POA: Diagnosis not present

## 2020-05-03 DIAGNOSIS — M9903 Segmental and somatic dysfunction of lumbar region: Secondary | ICD-10-CM | POA: Diagnosis not present

## 2020-05-03 DIAGNOSIS — G43001 Migraine without aura, not intractable, with status migrainosus: Secondary | ICD-10-CM | POA: Diagnosis not present

## 2020-05-03 DIAGNOSIS — M5416 Radiculopathy, lumbar region: Secondary | ICD-10-CM | POA: Diagnosis not present

## 2020-05-03 DIAGNOSIS — M9901 Segmental and somatic dysfunction of cervical region: Secondary | ICD-10-CM | POA: Diagnosis not present

## 2020-05-06 DIAGNOSIS — M5416 Radiculopathy, lumbar region: Secondary | ICD-10-CM | POA: Diagnosis not present

## 2020-05-06 DIAGNOSIS — M9901 Segmental and somatic dysfunction of cervical region: Secondary | ICD-10-CM | POA: Diagnosis not present

## 2020-05-06 DIAGNOSIS — G43001 Migraine without aura, not intractable, with status migrainosus: Secondary | ICD-10-CM | POA: Diagnosis not present

## 2020-05-06 DIAGNOSIS — M9903 Segmental and somatic dysfunction of lumbar region: Secondary | ICD-10-CM | POA: Diagnosis not present

## 2020-05-08 DIAGNOSIS — M9903 Segmental and somatic dysfunction of lumbar region: Secondary | ICD-10-CM | POA: Diagnosis not present

## 2020-05-08 DIAGNOSIS — M9901 Segmental and somatic dysfunction of cervical region: Secondary | ICD-10-CM | POA: Diagnosis not present

## 2020-05-08 DIAGNOSIS — M5416 Radiculopathy, lumbar region: Secondary | ICD-10-CM | POA: Diagnosis not present

## 2020-05-08 DIAGNOSIS — G43001 Migraine without aura, not intractable, with status migrainosus: Secondary | ICD-10-CM | POA: Diagnosis not present

## 2020-05-09 DIAGNOSIS — G43001 Migraine without aura, not intractable, with status migrainosus: Secondary | ICD-10-CM | POA: Diagnosis not present

## 2020-05-09 DIAGNOSIS — M9901 Segmental and somatic dysfunction of cervical region: Secondary | ICD-10-CM | POA: Diagnosis not present

## 2020-05-09 DIAGNOSIS — M5416 Radiculopathy, lumbar region: Secondary | ICD-10-CM | POA: Diagnosis not present

## 2020-05-09 DIAGNOSIS — M9903 Segmental and somatic dysfunction of lumbar region: Secondary | ICD-10-CM | POA: Diagnosis not present

## 2020-05-13 DIAGNOSIS — G43001 Migraine without aura, not intractable, with status migrainosus: Secondary | ICD-10-CM | POA: Diagnosis not present

## 2020-05-13 DIAGNOSIS — M9903 Segmental and somatic dysfunction of lumbar region: Secondary | ICD-10-CM | POA: Diagnosis not present

## 2020-05-13 DIAGNOSIS — M5416 Radiculopathy, lumbar region: Secondary | ICD-10-CM | POA: Diagnosis not present

## 2020-05-13 DIAGNOSIS — M9901 Segmental and somatic dysfunction of cervical region: Secondary | ICD-10-CM | POA: Diagnosis not present

## 2020-05-15 DIAGNOSIS — M9903 Segmental and somatic dysfunction of lumbar region: Secondary | ICD-10-CM | POA: Diagnosis not present

## 2020-05-15 DIAGNOSIS — M9901 Segmental and somatic dysfunction of cervical region: Secondary | ICD-10-CM | POA: Diagnosis not present

## 2020-05-15 DIAGNOSIS — G43001 Migraine without aura, not intractable, with status migrainosus: Secondary | ICD-10-CM | POA: Diagnosis not present

## 2020-05-15 DIAGNOSIS — M5416 Radiculopathy, lumbar region: Secondary | ICD-10-CM | POA: Diagnosis not present

## 2020-05-18 ENCOUNTER — Ambulatory Visit
Admission: EM | Admit: 2020-05-18 | Discharge: 2020-05-18 | Disposition: A | Discharge status: Home / Self Care | Payer: BC Managed Care – PPO | Attending: Emergency Medicine | Admitting: Emergency Medicine

## 2020-05-18 ENCOUNTER — Other Ambulatory Visit: Payer: Self-pay

## 2020-05-18 ENCOUNTER — Ambulatory Visit (INDEPENDENT_AMBULATORY_CARE_PROVIDER_SITE_OTHER): Payer: BC Managed Care – PPO

## 2020-05-18 DIAGNOSIS — S90122A Contusion of left lesser toe(s) without damage to nail, initial encounter: Secondary | ICD-10-CM | POA: Diagnosis not present

## 2020-05-18 DIAGNOSIS — S99922A Unspecified injury of left foot, initial encounter: Secondary | ICD-10-CM | POA: Diagnosis not present

## 2020-05-18 MED ORDER — IBUPROFEN 600 MG PO TABS
600.0000 mg | ORAL_TABLET | Freq: Four times a day (QID) | ORAL | 0 refills | Status: DC | PRN
Start: 2020-05-18 — End: 2021-02-20

## 2020-05-18 NOTE — Discharge Instructions (Signed)
600 mg of ibuprofen combined with 1000 mg of Tylenol 3-4 times a day as needed for pain.  Ice.  Buddy tape.

## 2020-05-18 NOTE — ED Triage Notes (Addendum)
Patient states that on Thursday evening she kicked a door by accident and has since had left 4th toe pain that now radiates through the top of her foot. Patient with bruising to the area.

## 2020-05-18 NOTE — ED Provider Notes (Signed)
HPI  SUBJECTIVE:  Stacey Huynh is a 34 y.o. female who presents with accidentally kicked a door with her bare left foot 2 days ago.  She reports constant pain with radiation of the dorsum of the foot along the third and fourth toes, bruising of the fourth toe, limitation of motion of her fourth toe.  She denies numbness or tingling.  She states that the rest of her foot is without injury.  She has tried ibuprofen 800 mg 1-2 times with improvement in her symptoms.  Symptoms are worse with walking, palpation.  Past medical history negative for diabetes, smoking.  LMP: Hysterectomy.  PMD: [facility].  Past Medical History:  Diagnosis Date  . Abnormal Pap smear   . Abnormal Pap smear of cervix   . Anxiety   . Hypertension     Past Surgical History:  Procedure Laterality Date  . CYSTOSCOPY N/A 01/25/2020   Procedure: CYSTOSCOPY;  Surgeon: Vena Austria, MD;  Location: [facility] ORS;  Service: Gynecology;  Laterality: N/A;  . LAPAROSCOPIC OVARIAN CYSTECTOMY Right 04/13/2019   Procedure: LAPAROSCOPIC RIGHT OVARIAN CYSTECTOMY and removal of right paratubal cyst;  Surgeon: Vena Austria, MD;  Location: [facility] ORS;  Service: Gynecology;  Laterality: Right;  . LEEP    . TOTAL LAPAROSCOPIC HYSTERECTOMY WITH SALPINGECTOMY N/A 01/25/2020   Procedure: TOTAL LAPAROSCOPIC HYSTERECTOMY WITH BILATERAL SALPINGECTOMY;  Surgeon: Vena Austria, MD;  Location: [facility] ORS;  Service: Gynecology;  Laterality: N/A;  . WISDOM TOOTH EXTRACTION      Family History  Problem Relation Age of Onset  . Pancreatic cancer Paternal Aunt   . Bone cancer Maternal Grandmother   . Lung cancer Maternal Grandmother   . Colon cancer Paternal Grandfather   . Depression Mother   . Depression Father   . Suicidality Father     Social History   Tobacco Use  . Smoking status: Never Smoker  . Smokeless tobacco: Never Used  Vaping Use  . Vaping Use: Never used  Substance Use Topics  . Alcohol use: Yes     Comment: rare  . Drug use: No    No current facility-administered medications for this encounter.  Current Outpatient Medications:  .  fexofenadine (ALLEGRA) 180 MG tablet, Take 1 tablet (180 mg total) by mouth daily., Disp: 90 tablet, Rfl: 1 .  FLUoxetine (PROZAC) 20 MG capsule, Take 1 capsule (20 mg total) by mouth daily., Disp: 90 capsule, Rfl: 1 .  lisinopril (ZESTRIL) 10 MG tablet, Take 1 tablet (10 mg total) by mouth daily., Disp: 90 tablet, Rfl: 1 .  nystatin cream (MYCOSTATIN), Apply 1 application topically 2 (two) times daily., Disp: 30 g, Rfl: 1 .  phenazopyridine (PYRIDIUM) 200 MG tablet, Take 1 tablet (200 mg total) by mouth 3 (three) times daily as needed for pain (urethral spasm)., Disp: 10 tablet, Rfl: 0 .  traZODone (DESYREL) 50 MG tablet, Take 1 tablet (50 mg total) by mouth at bedtime., Disp: 90 tablet, Rfl: 1 .  ibuprofen (ADVIL) 600 MG tablet, Take 1 tablet (600 mg total) by mouth every 6 (six) hours as needed., Disp: 30 tablet, Rfl: 0  Allergies  Allergen Reactions  . Penicillins Other (See Comments)    Did it involve swelling of the face/tongue/throat, SOB, or low BP? Unknown Did it involve sudden or severe rash/hives, skin peeling, or any reaction on the inside of your mouth or nose? Unknown Did you need to seek medical attention at a hospital or doctor's office? Unknown When did it last  happen? Childhood allergy If all above answers are "NO", may proceed with cephalosporin use.       ROS  As noted in HPI.   Physical Exam  BP 123/75 (BP Location: Left Arm)   Pulse 63   Temp 99.3 F (37.4 C) (Oral)   Resp 16   Ht 5\' 6"  (1.676 m)   Wt 99.8 kg   LMP 11/20/2019 (Approximate)   SpO2 99%   BMI 35.51 kg/m   Constitutional: Well developed, well nourished, no acute distress Eyes:  EOMI, conjunctiva normal bilaterally HENT: Normocephalic, atraumatic,mucus membranes moist Respiratory: Normal inspiratory effort Cardiovascular: Normal rate GI:  nondistended skin: No rash, skin intact Musculoskeletal: Left fourth toe tender, bruised.  Positive limitation of motion of the toe.  Positive tenderness along the third toe.  No other tenderness over the rest of the foot.  Sensation intact.  DP 2+. Neurologic: Alert & oriented x 3, no focal neuro deficits Psychiatric: Speech and behavior appropriate   ED Course   Medications - No data to display  Orders Placed This Encounter  Procedures  . DG Foot Complete Left    Standing Status:   Standing    Number of Occurrences:   1    Order Specific Question:   Reason for Exam (SYMPTOM  OR DIAGNOSIS REQUIRED)    Answer:   kicked a door, pain, bruising    No results found for this or any previous visit (from the past 24 hour(s)). DG Foot Complete Left  Result Date: 05/18/2020 CLINICAL DATA:  Kicked door 2 days prior with left fourth rib pain EXAM: LEFT FOOT - COMPLETE 3+ VIEW COMPARISON:  None. FINDINGS: There is no evidence of fracture or dislocation. There is no evidence of arthropathy or other focal bone abnormality. Soft tissues are unremarkable. IMPRESSION: No left foot fracture or dislocation. Electronically Signed   By: 07/18/2020 M.D.   On: 05/18/2020 13:57    ED Clinical Impression  1. Contusion of fourth toe of left foot, initial encounter      ED Assessment/Plan  Reviewed imaging independently.  No fracture, dislocation.  See radiology report for full details.  Patient with contusion of the fourth toe.  There is no fracture on x-ray.  Went over the films with the patient.  Buddy tape, Tylenol/ibuprofen, ice.  Follow-up with PMD as needed.   Meds ordered this encounter  Medications  . ibuprofen (ADVIL) 600 MG tablet    Sig: Take 1 tablet (600 mg total) by mouth every 6 (six) hours as needed.    Dispense:  30 tablet    Refill:  0    *This clinic note was created using 07/18/2020. Therefore, there may be occasional mistakes despite careful  proofreading.   ?    Scientist, clinical (histocompatibility and immunogenetics), MD 05/20/20 [phone]

## 2020-05-20 DIAGNOSIS — M9901 Segmental and somatic dysfunction of cervical region: Secondary | ICD-10-CM | POA: Diagnosis not present

## 2020-05-20 DIAGNOSIS — M5416 Radiculopathy, lumbar region: Secondary | ICD-10-CM | POA: Diagnosis not present

## 2020-05-20 DIAGNOSIS — G43001 Migraine without aura, not intractable, with status migrainosus: Secondary | ICD-10-CM | POA: Diagnosis not present

## 2020-05-20 DIAGNOSIS — M9903 Segmental and somatic dysfunction of lumbar region: Secondary | ICD-10-CM | POA: Diagnosis not present

## 2020-05-22 DIAGNOSIS — M5416 Radiculopathy, lumbar region: Secondary | ICD-10-CM | POA: Diagnosis not present

## 2020-05-22 DIAGNOSIS — M9903 Segmental and somatic dysfunction of lumbar region: Secondary | ICD-10-CM | POA: Diagnosis not present

## 2020-05-22 DIAGNOSIS — G43001 Migraine without aura, not intractable, with status migrainosus: Secondary | ICD-10-CM | POA: Diagnosis not present

## 2020-05-22 DIAGNOSIS — M9901 Segmental and somatic dysfunction of cervical region: Secondary | ICD-10-CM | POA: Diagnosis not present

## 2020-05-27 DIAGNOSIS — M9903 Segmental and somatic dysfunction of lumbar region: Secondary | ICD-10-CM | POA: Diagnosis not present

## 2020-05-27 DIAGNOSIS — M9901 Segmental and somatic dysfunction of cervical region: Secondary | ICD-10-CM | POA: Diagnosis not present

## 2020-05-27 DIAGNOSIS — G43001 Migraine without aura, not intractable, with status migrainosus: Secondary | ICD-10-CM | POA: Diagnosis not present

## 2020-05-27 DIAGNOSIS — M5416 Radiculopathy, lumbar region: Secondary | ICD-10-CM | POA: Diagnosis not present

## 2020-05-29 DIAGNOSIS — M5416 Radiculopathy, lumbar region: Secondary | ICD-10-CM | POA: Diagnosis not present

## 2020-05-29 DIAGNOSIS — M9901 Segmental and somatic dysfunction of cervical region: Secondary | ICD-10-CM | POA: Diagnosis not present

## 2020-05-29 DIAGNOSIS — G43001 Migraine without aura, not intractable, with status migrainosus: Secondary | ICD-10-CM | POA: Diagnosis not present

## 2020-05-29 DIAGNOSIS — M9903 Segmental and somatic dysfunction of lumbar region: Secondary | ICD-10-CM | POA: Diagnosis not present

## 2020-06-04 DIAGNOSIS — M9903 Segmental and somatic dysfunction of lumbar region: Secondary | ICD-10-CM | POA: Diagnosis not present

## 2020-06-04 DIAGNOSIS — M5416 Radiculopathy, lumbar region: Secondary | ICD-10-CM | POA: Diagnosis not present

## 2020-06-04 DIAGNOSIS — G43001 Migraine without aura, not intractable, with status migrainosus: Secondary | ICD-10-CM | POA: Diagnosis not present

## 2020-06-04 DIAGNOSIS — M9901 Segmental and somatic dysfunction of cervical region: Secondary | ICD-10-CM | POA: Diagnosis not present

## 2020-06-06 ENCOUNTER — Encounter: Payer: Self-pay | Admitting: Family Medicine

## 2020-06-06 ENCOUNTER — Other Ambulatory Visit: Payer: Self-pay

## 2020-06-06 ENCOUNTER — Ambulatory Visit (INDEPENDENT_AMBULATORY_CARE_PROVIDER_SITE_OTHER): Payer: BC Managed Care – PPO | Admitting: Family Medicine

## 2020-06-06 VITALS — BP 125/59 | HR 79 | Temp 98.5°F | Resp 17 | Ht 66.0 in | Wt 226.0 lb

## 2020-06-06 DIAGNOSIS — R221 Localized swelling, mass and lump, neck: Secondary | ICD-10-CM

## 2020-06-06 LAB — CBC WITH DIFFERENTIAL/PLATELET
Absolute Monocytes: 507 cells/uL (ref 200–950)
Basophils Absolute: 52 cells/uL (ref 0–200)
Basophils Relative: 0.6 %
Eosinophils Absolute: 172 cells/uL (ref 15–500)
Eosinophils Relative: 2 %
HCT: 39.6 % (ref 35.0–45.0)
Hemoglobin: 13.4 g/dL (ref 11.7–15.5)
Lymphs Abs: 1883 cells/uL (ref 850–3900)
MCH: 30.1 pg (ref 27.0–33.0)
MCHC: 33.8 g/dL (ref 32.0–36.0)
MCV: 89 fL (ref 80.0–100.0)
MPV: 10.3 fL (ref 7.5–12.5)
Monocytes Relative: 5.9 %
Neutro Abs: 5986 cells/uL (ref 1500–7800)
Neutrophils Relative %: 69.6 %
Platelets: 342 10*3/uL (ref 140–400)
RBC: 4.45 10*6/uL (ref 3.80–5.10)
RDW: 12.6 % (ref 11.0–15.0)
Total Lymphocyte: 21.9 %
WBC: 8.6 10*3/uL (ref 3.8–10.8)

## 2020-06-06 LAB — COMPLETE METABOLIC PANEL WITH GFR
AG Ratio: 1.6 (calc) (ref 1.0–2.5)
ALT: 14 U/L (ref 6–29)
AST: 13 U/L (ref 10–30)
Albumin: 4.2 g/dL (ref 3.6–5.1)
Alkaline phosphatase (APISO): 59 U/L (ref 31–125)
BUN: 10 mg/dL (ref 7–25)
CO2: 24 mmol/L (ref 20–32)
Calcium: 9.4 mg/dL (ref 8.6–10.2)
Chloride: 105 mmol/L (ref 98–110)
Creat: 0.91 mg/dL (ref 0.50–1.10)
GFR, Est African American: 95 mL/min/{1.73_m2} (ref >=60)
GFR, Est Non African American: 82 mL/min/{1.73_m2} (ref >=60)
Globulin: 2.6 g/dL (calc) (ref 1.9–3.7)
Glucose, Bld: 115 mg/dL — ABNORMAL HIGH (ref 65–99)
Potassium: 4.3 mmol/L (ref 3.5–5.3)
Sodium: 137 mmol/L (ref 135–146)
Total Bilirubin: 0.4 mg/dL (ref 0.2–1.2)
Total Protein: 6.8 g/dL (ref 6.1–8.1)

## 2020-06-06 NOTE — Patient Instructions (Signed)
We have sent your blood work off to the lab, we will contact you when we receive the results.  I have sent in an order for a neck/head soft tissue ultrasound.  You should receive a call in the next few days to schedule.  If you have not heard anything by next Thursday, to please let me know and I will follow up on this.  We will plan to see you back in 4 weeks for neck swelling follow up visit  You will receive a survey after today's visit either digitally by e-mail or paper by USPS mail. Your experiences and feedback matter to [place].  Please respond so we know how we are doing as we provide care for you.  Call us with any questions/concerns/needs.  It is my goal to be available to you for your health concerns.  Thanks for choosing me to be a partner in your healthcare needs!  Charlaine Dalton, FNP-C Family Nurse Practitioner [facility] Phone: [phone]

## 2020-06-06 NOTE — Progress Notes (Signed)
Subjective:    Patient ID: Stacey Huynh, female    DOB: 08-03-1986, 34 y.o.   MRN: [id number]  Stacey Huynh is a 34 y.o. female presenting on 06/06/2020 for neck swelling (swelling at the base of the left side of the neck x 1 day. She denies any pain or stiffiness.)   HPI  Ms. Hata presents to clinic for concerns of left sided neck swelling x 1 day.  Reports a coworker had brought it to her attention.  Denies difficulty swallowing, breathing, sore throat, pain with neck movement, fever, lymphadenopathy, easy bruisability, or unintentional weight loss.  No recent immunizations.  Last labs for thyroid function completed 02/19/2020.  No previous history of thyroid diagnosis.  Depression screen [facility] 2/9 03/12/2020 06/16/2019 12/08/2018  Decreased Interest 0 0 0  Down, Depressed, Hopeless 0 0 0  PHQ - 2 Score 0 0 0  Altered sleeping 0 3 -  Tired, decreased energy 0 1 -  Change in appetite 0 0 -  Feeling bad or failure about yourself  0 0 -  Trouble concentrating 0 0 -  Moving slowly or fidgety/restless 0 0 -  Suicidal thoughts 0 0 -  PHQ-9 Score 0 4 -  Difficult doing work/chores Not difficult at all Not difficult at all -    Social History   Tobacco Use  . Smoking status: Never Smoker  . Smokeless tobacco: Never Used  Vaping Use  . Vaping Use: Never used  Substance Use Topics  . Alcohol use: Yes    Comment: rare  . Drug use: No    Review of Systems  Constitutional: Negative.   HENT: Negative.        Left sided base of neck swelling anterior  Eyes: Negative.   Respiratory: Negative.   Cardiovascular: Negative.   Gastrointestinal: Negative.   Endocrine: Negative.   Genitourinary: Negative.   Musculoskeletal: Negative.   Skin: Negative.   Allergic/Immunologic: Negative.   Neurological: Negative.   Hematological: Negative.   Psychiatric/Behavioral: Negative.    Per HPI unless specifically indicated above     Objective:    BP (!) 125/59 (BP Location:  Left Arm, Patient Position: Sitting, Cuff Size: Large)   Pulse 79   Temp 98.5 F (36.9 C) (Oral)   Resp 17   Ht 5\' 6"  (1.676 m)   Wt 226 lb (102.5 kg)   LMP 11/20/2019 (Approximate)   SpO2 100%   BMI 36.48 kg/m   Wt Readings from Last 3 Encounters:  06/06/20 226 lb (102.5 kg)  05/18/20 220 lb (99.8 kg)  04/09/20 215 lb (97.5 kg)    Physical Exam Vitals reviewed.  Constitutional:      General: She is not in acute distress.    Appearance: Normal appearance. She is well-developed and well-groomed. She is obese. She is not ill-appearing or toxic-appearing.  HENT:     Head: Normocephalic and atraumatic.     Nose:     Comments: 04/11/20 is in place, covering mouth and nose. Eyes:     General: Lids are normal. Vision grossly intact.        Right eye: No discharge.        Left eye: No discharge.     Extraocular Movements: Extraocular movements intact.     Conjunctiva/sclera: Conjunctivae normal.     Pupils: Pupils are equal, round, and reactive to light.  Neck:     Thyroid: No thyroid mass, thyromegaly or thyroid tenderness.   Cardiovascular:  Rate and Rhythm: Normal rate and regular rhythm.     Pulses: Normal pulses.     Heart sounds: Normal heart sounds. No murmur heard.  No friction rub. No gallop.   Pulmonary:     Effort: Pulmonary effort is normal. No respiratory distress.     Breath sounds: Normal breath sounds.  Musculoskeletal:     Cervical back: Full passive range of motion without pain and normal range of motion. Edema present. No pain with movement. Normal range of motion.     Right lower leg: No edema.     Left lower leg: No edema.  Lymphadenopathy:     Cervical: No cervical adenopathy.  Skin:    General: Skin is warm and dry.     Capillary Refill: Capillary refill takes less than 2 seconds.  Neurological:     General: No focal deficit present.     Mental Status: She is alert and oriented to person, place, and time.  Psychiatric:        Attention and  Perception: Attention and perception normal.        Mood and Affect: Mood and affect normal.        Speech: Speech normal.        Behavior: Behavior normal. Behavior is cooperative.        Thought Content: Thought content normal.        Cognition and Memory: Cognition and memory normal.        Judgment: Judgment normal.    Results for orders placed or performed in visit on 06/06/20  CBC with Differential  Result Value Ref Range   WBC 8.6 3.8 - 10.8 Thousand/uL   RBC 4.45 3.80 - 5.10 Million/uL   Hemoglobin 13.4 11.7 - 15.5 g/dL   HCT 10.2 35 - 45 %   MCV 89.0 80.0 - 100.0 fL   MCH 30.1 27.0 - 33.0 pg   MCHC 33.8 32.0 - 36.0 g/dL   RDW 72.5 36.6 - 44.0 %   Platelets 342 140 - 400 Thousand/uL   MPV 10.3 7.5 - 12.5 fL   Neutro Abs 5,986 1,500 - 7,800 cells/uL   Lymphs Abs 1,883 850 - 3,900 cells/uL   Absolute Monocytes 507 200 - 950 cells/uL   Eosinophils Absolute 172 15 - 500 cells/uL   Basophils Absolute 52 0 - 200 cells/uL   Neutrophils Relative % 69.6 %   Total Lymphocyte 21.9 %   Monocytes Relative 5.9 %   Eosinophils Relative 2.0 %   Basophils Relative 0.6 %  COMPLETE METABOLIC PANEL WITH GFR  Result Value Ref Range   Glucose, Bld 115 (H) 65 - 99 mg/dL   BUN 10 7 - 25 mg/dL   Creat 3.47 4.25 - 9.56 mg/dL   GFR, Est Non African American 82 > OR = 60 mL/min/1.62m2   GFR, Est African American 95 > OR = 60 mL/min/1.2m2   BUN/Creatinine Ratio NOT APPLICABLE 6 - 22 (calc)   Sodium 137 135 - 146 mmol/L   Potassium 4.3 3.5 - 5.3 mmol/L   Chloride 105 98 - 110 mmol/L   CO2 24 20 - 32 mmol/L   Calcium 9.4 8.6 - 10.2 mg/dL   Total Protein 6.8 6.1 - 8.1 g/dL   Albumin 4.2 3.6 - 5.1 g/dL   Globulin 2.6 1.9 - 3.7 g/dL (calc)   AG Ratio 1.6 1.0 - 2.5 (calc)   Total Bilirubin 0.4 0.2 - 1.2 mg/dL   Alkaline phosphatase (APISO) 59 31 - 125 U/L  AST 13 10 - 30 U/L   ALT 14 6 - 29 U/L      Assessment & Plan:   Problem List Items Addressed This Visit      Other   Neck  swelling - Primary    Left sided neck swelling with onset x 1 day.  No previous thyroid diagnoses, no recent immunizations.  Will have CBC and CMP drawn to evaluate for infection.  No palpable lymphadenopathy for anterior/posterior cervical lymphadenopathy.  Will order head/neck ultrasound due to amount of swelling present.  Patient requesting COVID swab due to neck swelling.  Plan: 1. Labs drawn today for CBC and CMP 2. Head/neck US ordered 3. COVID swab taken and sent to lab      Relevant Orders   US Soft Tissue Head/Neck (NON-THYROID)   CBC with Differential (Completed)   COMPLETE METABOLIC PANEL WITH GFR (Completed)   Novel Coronavirus, NAA ([facility])      No orders of the defined types were placed in this encounter.   Follow up plan: Return in about 4 weeks (around 07/04/2020) for Left neck swelling follow up.   Charlaine Dalton, FNP Family Nurse Practitioner [facility] 06/06/2020, 4:15 PM

## 2020-06-07 ENCOUNTER — Encounter: Payer: Self-pay | Admitting: Family Medicine

## 2020-06-07 DIAGNOSIS — R221 Localized swelling, mass and lump, neck: Secondary | ICD-10-CM | POA: Insufficient documentation

## 2020-06-07 NOTE — Assessment & Plan Note (Signed)
Left sided neck swelling with onset x 1 day.  No previous thyroid diagnoses, no recent immunizations.  Will have CBC and CMP drawn to evaluate for infection.  No palpable lymphadenopathy for anterior/posterior cervical lymphadenopathy.  Will order head/neck ultrasound due to amount of swelling present.  Patient requesting COVID swab due to neck swelling.  Plan: 1. Labs drawn today for CBC and CMP 2. Head/neck US ordered 3. COVID swab taken and sent to lab

## 2020-06-08 LAB — NOVEL CORONAVIRUS, NAA: SARS-CoV-2, NAA: NOT DETECTED

## 2020-06-08 LAB — SPECIMEN STATUS REPORT

## 2020-06-08 LAB — SARS-COV-2, NAA 2 DAY TAT

## 2020-06-12 ENCOUNTER — Ambulatory Visit
Admission: RE | Admit: 2020-06-12 | Discharge: 2020-06-12 | Disposition: A | Discharge status: Home / Self Care | Payer: BC Managed Care – PPO | Source: Ambulatory Visit | Attending: Family Medicine | Admitting: Family Medicine

## 2020-06-12 ENCOUNTER — Other Ambulatory Visit: Payer: Self-pay

## 2020-06-12 DIAGNOSIS — R221 Localized swelling, mass and lump, neck: Secondary | ICD-10-CM | POA: Diagnosis not present

## 2020-06-12 DIAGNOSIS — G43001 Migraine without aura, not intractable, with status migrainosus: Secondary | ICD-10-CM | POA: Diagnosis not present

## 2020-06-12 DIAGNOSIS — M9903 Segmental and somatic dysfunction of lumbar region: Secondary | ICD-10-CM | POA: Diagnosis not present

## 2020-06-12 DIAGNOSIS — M9901 Segmental and somatic dysfunction of cervical region: Secondary | ICD-10-CM | POA: Diagnosis not present

## 2020-06-12 DIAGNOSIS — M5416 Radiculopathy, lumbar region: Secondary | ICD-10-CM | POA: Diagnosis not present

## 2020-06-23 DIAGNOSIS — B349 Viral infection, unspecified: Secondary | ICD-10-CM | POA: Diagnosis not present

## 2020-06-23 DIAGNOSIS — Z20822 Contact with and (suspected) exposure to covid-19: Secondary | ICD-10-CM | POA: Diagnosis not present

## 2020-08-15 ENCOUNTER — Other Ambulatory Visit: Payer: Self-pay | Admitting: Family Medicine

## 2020-08-15 DIAGNOSIS — J302 Other seasonal allergic rhinitis: Secondary | ICD-10-CM

## 2020-08-21 ENCOUNTER — Ambulatory Visit: Payer: BLUE CROSS/BLUE SHIELD | Admitting: Family Medicine

## 2020-09-19 ENCOUNTER — Other Ambulatory Visit: Payer: Self-pay | Admitting: Family Medicine

## 2020-09-19 DIAGNOSIS — F419 Anxiety disorder, unspecified: Secondary | ICD-10-CM

## 2020-09-19 DIAGNOSIS — F5104 Psychophysiologic insomnia: Secondary | ICD-10-CM

## 2020-09-29 ENCOUNTER — Emergency Department: Payer: HRSA Program

## 2020-09-29 ENCOUNTER — Encounter: Payer: Self-pay | Admitting: Emergency Medicine

## 2020-09-29 ENCOUNTER — Other Ambulatory Visit: Payer: Self-pay

## 2020-09-29 ENCOUNTER — Emergency Department
Admission: EM | Admit: 2020-09-29 | Discharge: 2020-09-30 | Disposition: A | Discharge status: Home / Self Care | Payer: HRSA Program | Attending: Emergency Medicine | Admitting: Emergency Medicine

## 2020-09-29 DIAGNOSIS — J1282 Pneumonia due to coronavirus disease 2019: Secondary | ICD-10-CM | POA: Insufficient documentation

## 2020-09-29 DIAGNOSIS — R112 Nausea with vomiting, unspecified: Secondary | ICD-10-CM | POA: Diagnosis not present

## 2020-09-29 DIAGNOSIS — Z79899 Other long term (current) drug therapy: Secondary | ICD-10-CM | POA: Insufficient documentation

## 2020-09-29 DIAGNOSIS — U071 COVID-19: Secondary | ICD-10-CM

## 2020-09-29 DIAGNOSIS — R197 Diarrhea, unspecified: Secondary | ICD-10-CM

## 2020-09-29 DIAGNOSIS — R509 Fever, unspecified: Secondary | ICD-10-CM

## 2020-09-29 DIAGNOSIS — R059 Cough, unspecified: Secondary | ICD-10-CM | POA: Diagnosis present

## 2020-09-29 DIAGNOSIS — I10 Essential (primary) hypertension: Secondary | ICD-10-CM | POA: Insufficient documentation

## 2020-09-29 LAB — URINALYSIS, COMPLETE (UACMP) WITH MICROSCOPIC
Bilirubin Urine: NEGATIVE
Glucose, UA: NEGATIVE mg/dL
Hgb urine dipstick: NEGATIVE
Ketones, ur: NEGATIVE mg/dL
Leukocytes,Ua: NEGATIVE
Nitrite: NEGATIVE
Protein, ur: 30 mg/dL — AB
Specific Gravity, Urine: 1.029 (ref 1.005–1.030)
pH: 5 (ref 5.0–8.0)

## 2020-09-29 LAB — COMPREHENSIVE METABOLIC PANEL
ALT: 65 U/L — ABNORMAL HIGH (ref 0–44)
AST: 50 U/L — ABNORMAL HIGH (ref 15–41)
Albumin: 4.1 g/dL (ref 3.5–5.0)
Alkaline Phosphatase: 67 U/L (ref 38–126)
Anion gap: 10 (ref 5–15)
BUN: 12 mg/dL (ref 6–20)
CO2: 27 mmol/L (ref 22–32)
Calcium: 9 mg/dL (ref 8.9–10.3)
Chloride: 103 mmol/L (ref 98–111)
Creatinine, Ser: 0.76 mg/dL (ref 0.44–1.00)
GFR, Estimated: >60 mL/min (ref >60)
Glucose, Bld: 116 mg/dL — ABNORMAL HIGH (ref 70–99)
Potassium: 3.7 mmol/L (ref 3.5–5.1)
Sodium: 140 mmol/L (ref 135–145)
Total Bilirubin: 0.5 mg/dL (ref 0.3–1.2)
Total Protein: 8 g/dL (ref 6.5–8.1)

## 2020-09-29 LAB — CBC
HCT: 44.3 % (ref 36.0–46.0)
Hemoglobin: 14.8 g/dL (ref 12.0–15.0)
MCH: 30.6 pg (ref 26.0–34.0)
MCHC: 33.4 g/dL (ref 30.0–36.0)
MCV: 91.5 fL (ref 80.0–100.0)
Platelets: 177 10*3/uL (ref 150–400)
RBC: 4.84 MIL/uL (ref 3.87–5.11)
RDW: 12.2 % (ref 11.5–15.5)
WBC: 3.5 10*3/uL — ABNORMAL LOW (ref 4.0–10.5)
nRBC: 0 % (ref 0.0–0.2)

## 2020-09-29 LAB — RESP PANEL BY RT-PCR (FLU A&B, COVID) ARPGX2
Influenza A by PCR: NEGATIVE
Influenza B by PCR: NEGATIVE
SARS Coronavirus 2 by RT PCR: POSITIVE — AB

## 2020-09-29 LAB — LIPASE, BLOOD: Lipase: 42 U/L (ref 11–51)

## 2020-09-29 MED ORDER — KETOROLAC TROMETHAMINE 30 MG/ML IJ SOLN
10.0000 mg | Freq: Once | INTRAMUSCULAR | Status: AC
  Administered 2020-09-30: 9.9 mg via INTRAVENOUS
  Filled 2020-09-29: qty 1

## 2020-09-29 MED ORDER — SODIUM CHLORIDE 0.9 % IV BOLUS
1000.0000 mL | Freq: Once | INTRAVENOUS | Status: AC
  Administered 2020-09-30: 1000 mL via INTRAVENOUS

## 2020-09-29 MED ORDER — ONDANSETRON HCL 4 MG/2ML IJ SOLN
4.0000 mg | Freq: Once | INTRAMUSCULAR | Status: AC
  Administered 2020-09-30: 4 mg via INTRAVENOUS
  Filled 2020-09-29: qty 2

## 2020-09-29 MED ORDER — METHYLPREDNISOLONE SODIUM SUCC 125 MG IJ SOLR
125.0000 mg | Freq: Once | INTRAMUSCULAR | Status: AC
  Administered 2020-09-30: 125 mg via INTRAVENOUS
  Filled 2020-09-29: qty 2

## 2020-09-29 NOTE — ED Triage Notes (Signed)
Patient ambulatory to triage with complaints of COVID positive fever (pt c/o not being able to get rid of fever), pt also reports difficulty breathing with mask on, N/VD with continuous diarrhea and vomiting x 1 in the last 24 hours,.  Pt reports last tylenol x 2 extra strength at 1830.  COVID positive 6 days prior.     Speaking in complete coherent sentences. No acute breathing distress noted.

## 2020-09-29 NOTE — ED Notes (Signed)
Date and time results received: 09/29/20 11:14 PM (use smartphrase ".now" to insert current time)  Test: Covid-19 Critical Value: Positive  Name of Provider Notified: Dr. Dolores Frame  Orders Received? Or Actions Taken?: No new orders given

## 2020-09-29 NOTE — ED Provider Notes (Signed)
[facility] Emergency Department Provider Note   ____________________________________________   Event Date/Time   First MD Initiated Contact with Patient 09/29/20 2320     (approximate)  I have reviewed the triage vital signs and the nursing notes.   HISTORY  Chief Complaint Fever, Emesis, Diarrhea, and Covid Positive    HPI Stacey Huynh is a 34 y.o. female who presents to the ED from home with a chief complaint of cough, shortness of breath, fever, nausea, vomiting and diarrhea.  Patient had a positive Covid at home test 6 days ago; symptoms began 7 days ago.  She is unvaccinated against COVID-19.  Presents with the above symptoms.  Denies chest pain, abdominal pain, dysuria.     Past Medical History:  Diagnosis Date  . Abnormal Pap smear   . Abnormal Pap smear of cervix   . Anxiety   . Hypertension     Patient Active Problem List   Diagnosis Date Noted  . Neck swelling 06/07/2020  . Anxiety 02/19/2020  . Routine medical exam 02/19/2020  . Bloating 02/19/2020  . S/P laparoscopic hysterectomy 01/25/2020  . Abnormal uterine bleeding   . Essential hypertension 03/25/2018    Past Surgical History:  Procedure Laterality Date  . CYSTOSCOPY N/A 01/25/2020   Procedure: CYSTOSCOPY;  Surgeon: Vena AustriaStaebler, Andreas, MD;  Location: [facility] ORS;  Service: Gynecology;  Laterality: N/A;  . LAPAROSCOPIC OVARIAN CYSTECTOMY Right 04/13/2019   Procedure: LAPAROSCOPIC RIGHT OVARIAN CYSTECTOMY and removal of right paratubal cyst;  Surgeon: Vena AustriaStaebler, Andreas, MD;  Location: [facility] ORS;  Service: Gynecology;  Laterality: Right;  . LEEP    . TOTAL LAPAROSCOPIC HYSTERECTOMY WITH SALPINGECTOMY N/A 01/25/2020   Procedure: TOTAL LAPAROSCOPIC HYSTERECTOMY WITH BILATERAL SALPINGECTOMY;  Surgeon: Vena AustriaStaebler, Andreas, MD;  Location: [facility] ORS;  Service: Gynecology;  Laterality: N/A;  . WISDOM TOOTH EXTRACTION      Prior to Admission medications   Medication Sig Start Date End  Date Taking? Authorizing Provider  fexofenadine (ALLEGRA) 180 MG tablet TAKE 1 TABLET BY MOUTH EVERY DAY 08/15/20  Yes Malfi, Jodelle GrossNicole M, FNP  FLUoxetine (PROZAC) 20 MG capsule TAKE 1 CAPSULE BY MOUTH EVERY DAY 09/19/20  Yes Malfi, Jodelle GrossNicole M, FNP  lisinopril (ZESTRIL) 10 MG tablet Take 1 tablet (10 mg total) by mouth daily. 02/19/20  Yes Malfi, Jodelle GrossNicole M, FNP  traZODone (DESYREL) 50 MG tablet TAKE 1 TABLET BY MOUTH EVERYDAY AT BEDTIME 09/19/20  Yes Malfi, Jodelle GrossNicole M, FNP  albuterol (VENTOLIN HFA) 108 (90 Base) MCG/ACT inhaler Inhale 2 puffs into the lungs every 4 (four) hours as needed for wheezing or shortness of breath. 09/30/20   Irean HongSung, Merdis Snodgrass J, MD  azithromycin (ZITHROMAX) 250 MG tablet Take 1 tablet (250 mg total) by mouth daily. 09/30/20   Irean HongSung, Masiyah Jorstad J, MD  chlorpheniramine-HYDROcodone [facility](TUSSIONEX PENNKINETIC ER) 10-8 MG/5ML SUER Take 5 mLs by mouth 2 (two) times daily. 09/30/20   Irean HongSung, Sammye Staff J, MD  ibuprofen (ADVIL) 600 MG tablet Take 1 tablet (600 mg total) by mouth every 6 (six) hours as needed. Patient not taking: Reported on 09/29/2020 05/18/20   Domenick GongMortenson, Ashley, MD  nystatin cream (MYCOSTATIN) Apply 1 application topically 2 (two) times daily. Patient not taking: No sig reported 03/08/20   Vena AustriaStaebler, Andreas, MD  ondansetron (ZOFRAN ODT) 4 MG disintegrating tablet Take 1 tablet (4 mg total) by mouth every 8 (eight) hours as needed for nausea or vomiting. 09/30/20   Irean HongSung, Lakeita Panther J, MD  predniSONE (DELTASONE) 50 MG tablet 1 tablet daily until finished  09/30/20   Irean Hong, MD    Allergies Penicillins  Family History  Problem Relation Age of Onset  . Pancreatic cancer Paternal Aunt   . Bone cancer Maternal Grandmother   . Lung cancer Maternal Grandmother   . Colon cancer Paternal Grandfather   . Depression Mother   . Depression Father   . Suicidality Father     Social History Social History   Tobacco Use  . Smoking status: Never Smoker  . Smokeless tobacco: Never Used  Vaping Use  .  Vaping Use: Never used  Substance Use Topics  . Alcohol use: Yes    Comment: rare  . Drug use: No    Review of Systems  Constitutional: Positive for fever Eyes: No visual changes. ENT: No sore throat. Cardiovascular: Denies chest pain. Respiratory: Positive for cough and shortness of breath. Gastrointestinal: No abdominal pain.  Positive for nausea, vomiting and diarrhea.  No constipation. Genitourinary: Negative for dysuria. Musculoskeletal: Negative for back pain. Skin: Negative for rash. Neurological: Negative for headaches, focal weakness or numbness.   ____________________________________________   PHYSICAL EXAM:  VITAL SIGNS: ED Triage Vitals [09/29/20 2121]  Enc Vitals Group     BP 132/81     Pulse Rate 96     Resp 20     Temp 98.1 F (36.7 C)     Temp Source Oral     SpO2 97 %     Weight 217 lb (98.4 kg)     Height 5\' 6"  (1.676 m)     Head Circumference      Peak Flow      Pain Score 0     Pain Loc      Pain Edu?      Excl. in GC?     Constitutional: Alert and oriented. Well appearing and in mild acute distress. Eyes: Conjunctivae are normal. PERRL. EOMI. Head: Atraumatic. Nose: No congestion/rhinnorhea. Mouth/Throat: Mucous membranes are mildly dry.   Neck: No stridor.   Cardiovascular: Normal rate, regular rhythm. Grossly normal heart sounds.  Good peripheral circulation. Respiratory: Normal respiratory effort.  No retractions. Lungs CTAB. Gastrointestinal: Soft and nontender to light or deep palpation. No distention. No abdominal bruits. No CVA tenderness. Musculoskeletal: No lower extremity tenderness nor edema.  No joint effusions. Neurologic:  Normal speech and language. No gross focal neurologic deficits are appreciated. No gait instability. Skin:  Skin is warm, dry and intact. No rash noted.  No petechiae. Psychiatric: Mood and affect are normal. Speech and behavior are normal.  ____________________________________________   LABS (all  labs ordered are listed, but only abnormal results are displayed)  Labs Reviewed  RESP PANEL BY RT-PCR (FLU A&B, COVID) ARPGX2 - Abnormal; Notable for the following components:      Result Value   SARS Coronavirus 2 by RT PCR POSITIVE (*)    All other components within normal limits  COMPREHENSIVE METABOLIC PANEL - Abnormal; Notable for the following components:   Glucose, Bld 116 (*)    AST 50 (*)    ALT 65 (*)    All other components within normal limits  CBC - Abnormal; Notable for the following components:   WBC 3.5 (*)    All other components within normal limits  URINALYSIS, COMPLETE (UACMP) WITH MICROSCOPIC - Abnormal; Notable for the following components:   Color, Urine YELLOW (*)    APPearance HAZY (*)    Protein, ur 30 (*)    Bacteria, UA RARE (*)    All other  components within normal limits  LIPASE, BLOOD   ____________________________________________  EKG  None ____________________________________________  RADIOLOGY I, Jesstin Studstill J, personally viewed and evaluated these images (plain radiographs) as part of my medical decision making, as well as reviewing the written report by the radiologist.  ED MD interpretation: Early COVID-19 pneumonia  Official radiology report(s): DG Chest 2 View  Result Date: 09/29/2020 CLINICAL DATA:  Fever.  COVID positive. EXAM: CHEST - 2 VIEW COMPARISON:  June 30, 2018 FINDINGS: Very mild areas of atelectasis and/or early infiltrate are seen within the bilateral lung bases. There is no evidence of a pleural effusion or pneumothorax. The heart size and mediastinal contours are within normal limits. The visualized skeletal structures are unremarkable. IMPRESSION: Very mild bibasilar atelectasis and/or early infiltrate. Electronically Signed   By: Aram Candela M.D.   On: 09/29/2020 22:16    ____________________________________________   PROCEDURES  Procedure(s) performed (including Critical  Care):  Procedures   ____________________________________________   INITIAL IMPRESSION / ASSESSMENT AND PLAN / ED COURSE  As part of my medical decision making, I reviewed the following data within the electronic MEDICAL RECORD NUMBER Nursing notes reviewed and incorporated, Labs reviewed, Old chart reviewed, Radiograph reviewed, Notes from prior ED visits and [place] Controlled Substance Database     34 year old unvaccinated Covid+ female presenting with fever, cough, shortness of breath, nausea/vomiting/diarrhea.  Differential diagnosis includes but is not limited to symptoms of viral process, particularly COVID-19, CAP, gastroenteritis, etc.  PCR confirmation positive.  Laboratory results unremarkable.  Will administer supportive treatment with IV fluid hydration, IV Toradol, IV Zofran.  Will start steroid and azithromycin.  Will refer patient to antibiotic infusion clinic.  Clinical Course as of 09/30/20 0433  Mon Sep 30, 2020  0231 Delay due to other critical care patients.  Patient feeling significantly better.  Tolerated ice chips without emesis.  Will refer to monoclonal antibody infusion clinic.  Will discharge home on prednisone, azithromycin, Tussionex and albuterol.  Strict return precautions given.  Patient verbalizes understanding agrees with plan of care. [JS]    Clinical Course User Index [JS] Irean Hong, MD     ____________________________________________   FINAL CLINICAL IMPRESSION(S) / ED DIAGNOSES  Final diagnoses:  Pneumonia due to COVID-19 virus  Nausea vomiting and diarrhea  Fever, unspecified fever cause     ED Discharge Orders         Ordered    predniSONE (DELTASONE) 50 MG tablet        09/30/20 0029    albuterol (VENTOLIN HFA) 108 (90 Base) MCG/ACT inhaler  Every 4 hours PRN        09/30/20 0029    azithromycin (ZITHROMAX) 250 MG tablet  Daily        09/30/20 0029    chlorpheniramine-HYDROcodone (TUSSIONEX PENNKINETIC ER) 10-8 MG/5ML SUER  2 times  daily        09/30/20 0029    ondansetron (ZOFRAN ODT) 4 MG disintegrating tablet  Every 8 hours PRN        09/30/20 0029          *Please note:  Stacey Huynh was evaluated in Emergency Department on 09/30/2020 for the symptoms described in the history of present illness. She was evaluated in the context of the global COVID-19 pandemic, which necessitated consideration that the patient might be at risk for infection with the SARS-CoV-2 virus that causes COVID-19. Institutional protocols and algorithms that pertain to the evaluation of patients at risk for COVID-19 are in a state  of rapid change based on information released by regulatory bodies including the CDC and federal and state organizations. These policies and algorithms were followed during the patient's care in the ED.  Some ED evaluations and interventions may be delayed as a result of limited staffing during and the pandemic.*   Note:  This document was prepared using Dragon voice recognition software and may include unintentional dictation errors.   Irean Hong, MD 09/30/20 [phone]

## 2020-09-30 MED ORDER — ONDANSETRON 4 MG PO TBDP
4.0000 mg | ORAL_TABLET | Freq: Three times a day (TID) | ORAL | 0 refills | Status: DC | PRN
Start: 2020-09-30 — End: 2021-02-20

## 2020-09-30 MED ORDER — PREDNISONE 50 MG PO TABS: ORAL_TABLET | ORAL | 0 refills | Status: DC

## 2020-09-30 MED ORDER — HYDROCOD POLST-CPM POLST ER 10-8 MG/5ML PO SUER: 5.0000 mL | Freq: Two times a day (BID) | ORAL | 0 refills | Status: DC

## 2020-09-30 MED ORDER — ALBUTEROL SULFATE HFA 108 (90 BASE) MCG/ACT IN AERS
2.0000 | INHALATION_SPRAY | RESPIRATORY_TRACT | 0 refills | Status: DC | PRN
Start: 2020-09-30 — End: 2021-12-06

## 2020-09-30 MED ORDER — AZITHROMYCIN 250 MG PO TABS
250.0000 mg | ORAL_TABLET | Freq: Every day | ORAL | 0 refills | Status: DC
Start: 2020-09-30 — End: 2021-02-20

## 2020-09-30 NOTE — Discharge Instructions (Addendum)
1.  Someone will contact you from the antibody infusion clinic to talk to you and schedule your infusion if you are eligible. 2.  Finish antibiotic and steroid as prescribed (Prednisone 50mg  daily x 4 days, Azithromycin  250mg  daily x 4 days). 3.  You may take cough medicine as needed (Tussionex). 4.  You may take nausea medicine as needed (Zofran). 5.  You may go back to work on December 23.  This will finish 10 days of quarantine from the onset of your symptoms. 6.  Return to the ER for worsening symptoms, persistent vomiting, difficulty breathing or other concerns.

## 2020-10-01 ENCOUNTER — Telehealth (HOSPITAL_COMMUNITY): Payer: Self-pay | Admitting: Family

## 2020-10-01 NOTE — Telephone Encounter (Signed)
Called to discuss with Stacey Huynh about Covid symptoms and potential candidacy for the use of sotrovimab, a combination monoclonal antibody infusion for those with mild to moderate Covid symptoms and at a high risk of hospitalization.     Pt does not appear to be qualified for this infusion at this time as current policy is to infuse individuals within 7 days of onset and the patient is on day 9 per the ED notes. Attempted to call and advise patient of this, but unable to reach patient.   Bitania Shankland,NP

## 2021-01-17 ENCOUNTER — Encounter: Payer: Self-pay | Admitting: Physician Assistant

## 2021-02-20 ENCOUNTER — Encounter: Payer: Self-pay | Admitting: Internal Medicine

## 2021-02-20 ENCOUNTER — Other Ambulatory Visit: Payer: Self-pay

## 2021-02-20 ENCOUNTER — Ambulatory Visit (INDEPENDENT_AMBULATORY_CARE_PROVIDER_SITE_OTHER): Payer: BC Managed Care – PPO | Admitting: Internal Medicine

## 2021-02-20 VITALS — BP 130/79 | HR 72 | Ht 66.0 in | Wt 225.8 lb

## 2021-02-20 DIAGNOSIS — Z1159 Encounter for screening for other viral diseases: Secondary | ICD-10-CM | POA: Diagnosis not present

## 2021-02-20 DIAGNOSIS — I10 Essential (primary) hypertension: Secondary | ICD-10-CM | POA: Diagnosis not present

## 2021-02-20 DIAGNOSIS — K219 Gastro-esophageal reflux disease without esophagitis: Secondary | ICD-10-CM | POA: Insufficient documentation

## 2021-02-20 DIAGNOSIS — Z0001 Encounter for general adult medical examination with abnormal findings: Secondary | ICD-10-CM | POA: Diagnosis not present

## 2021-02-20 DIAGNOSIS — F419 Anxiety disorder, unspecified: Secondary | ICD-10-CM | POA: Diagnosis not present

## 2021-02-20 DIAGNOSIS — Z6836 Body mass index (BMI) 36.0-36.9, adult: Secondary | ICD-10-CM

## 2021-02-20 MED ORDER — LISINOPRIL 10 MG PO TABS: 10.0000 mg | ORAL_TABLET | Freq: Every day | ORAL | 3 refills | Status: DC

## 2021-02-20 NOTE — Assessment & Plan Note (Signed)
Avoid foods that trigger your reflux Encourage weight loss as this will reduce reflux symptoms Okay to continue Tums/Rolaids OTC as needed

## 2021-02-20 NOTE — Assessment & Plan Note (Signed)
Currently not an issue off meds Will monitor 

## 2021-02-20 NOTE — Assessment & Plan Note (Signed)
Slightly elevated today Reinforced DASH diet and exercise for weight loss Lisinopril refilled C-Met today Will monitor

## 2021-02-20 NOTE — Progress Notes (Signed)
Subjective:    Patient ID: Stacey Huynh, female    DOB: Dec 13, 1985, 35 y.o.   MRN: [id number]  HPI  Pt presents to the clinic today for her annual exam. She is also due to follow up chronic conditions.  Anxiety: Currently not an issue.  She is no longer taking Fluoxetine or Trazodone.  She is not seeing a therapist.  She denies depression, SI/HI.  HTN: Her BP today is 130/79.  She is not currently taking her Lisinopril.  ECG from 01/2020 reviewed.  GERD: Triggered by greasy and fried foods.  Not occurring more than 3 days/week.  She takes Tums/Rolaids as needed with good relief of symptoms.  There is no upper GI on file.  Flu: 10/2018 Tetanus: 12/2012 Covid: never Pap Smear: 05/2017, partial hysterectomy Dentist: biannually  Diet: She does eat meat. She consumes fruits and veggies. She does eat some fried foods. She drinks mostly soda. Exercise: Walking  Review of Systems      Past Medical History:  Diagnosis Date  . Abnormal Pap smear   . Abnormal Pap smear of cervix   . Anxiety   . Hypertension     Current Outpatient Medications  Medication Sig Dispense Refill  . albuterol (VENTOLIN HFA) 108 (90 Base) MCG/ACT inhaler Inhale 2 puffs into the lungs every 4 (four) hours as needed for wheezing or shortness of breath. 18 g 0  . azithromycin (ZITHROMAX) 250 MG tablet Take 1 tablet (250 mg total) by mouth daily. 4 each 0  . chlorpheniramine-HYDROcodone (TUSSIONEX PENNKINETIC ER) 10-8 MG/5ML SUER Take 5 mLs by mouth 2 (two) times daily. 70 mL 0  . fexofenadine (ALLEGRA) 180 MG tablet TAKE 1 TABLET BY MOUTH EVERY DAY 90 tablet 1  . FLUoxetine (PROZAC) 20 MG capsule TAKE 1 CAPSULE BY MOUTH EVERY DAY 90 capsule 0  . ibuprofen (ADVIL) 600 MG tablet Take 1 tablet (600 mg total) by mouth every 6 (six) hours as needed. (Patient not taking: Reported on 09/29/2020) 30 tablet 0  . lisinopril (ZESTRIL) 10 MG tablet Take 1 tablet (10 mg total) by mouth daily. 90 tablet 1  . nystatin  cream (MYCOSTATIN) Apply 1 application topically 2 (two) times daily. (Patient not taking: No sig reported) 30 g 1  . ondansetron (ZOFRAN ODT) 4 MG disintegrating tablet Take 1 tablet (4 mg total) by mouth every 8 (eight) hours as needed for nausea or vomiting. 20 tablet 0  . predniSONE (DELTASONE) 50 MG tablet 1 tablet daily until finished 4 tablet 0  . traZODone (DESYREL) 50 MG tablet TAKE 1 TABLET BY MOUTH EVERYDAY AT BEDTIME 90 tablet 0   No current facility-administered medications for this visit.    Allergies  Allergen Reactions  . Penicillins Other (See Comments)    Did it involve swelling of the face/tongue/throat, SOB, or low BP? Unknown Did it involve sudden or severe rash/hives, skin peeling, or any reaction on the inside of your mouth or nose? Unknown Did you need to seek medical attention at a hospital or doctor's office? Unknown When did it last happen? Childhood allergy If all above answers are "NO", may proceed with cephalosporin use.      Family History  Problem Relation Age of Onset  . Pancreatic cancer Paternal Aunt   . Bone cancer Maternal Grandmother   . Lung cancer Maternal Grandmother   . Colon cancer Paternal Grandfather   . Depression Mother   . Depression Father   . Suicidality Father     Social  History   Socioeconomic History  . Marital status: Married    Spouse name: Not on file  . Number of children: Not on file  . Years of education: Not on file  . Highest education level: Not on file  Occupational History  . Not on file  Tobacco Use  . Smoking status: Never Smoker  . Smokeless tobacco: Never Used  Vaping Use  . Vaping Use: Never used  Substance and Sexual Activity  . Alcohol use: Yes    Comment: rare  . Drug use: No  . Sexual activity: Yes    Birth control/protection: Surgical    Comment: Hysterectomy  Other Topics Concern  . Not on file  Social History Narrative  . Not on file   Social Determinants of Health   Financial  Resource Strain: Not on file  Food Insecurity: Not on file  Transportation Needs: Not on file  Physical Activity: Not on file  Stress: Not on file  Social Connections: Not on file  Intimate Partner Violence: Not on file     Constitutional: Denies fever, malaise, fatigue, headache or abrupt weight changes.  HEENT: Denies eye pain, eye redness, ear pain, ringing in the ears, wax buildup, runny nose, nasal congestion, bloody nose, or sore throat. Respiratory: Denies difficulty breathing, shortness of breath, cough or sputum production.   Cardiovascular: Denies chest pain, chest tightness, palpitations or swelling in the hands or feet.  Gastrointestinal: Patient reports intermittent reflux.  Denies abdominal pain, bloating, constipation, diarrhea or blood in the stool.  GU: Denies urgency, frequency, pain with urination, burning sensation, blood in urine, odor or discharge. Musculoskeletal: Denies decrease in range of motion, difficulty with gait, muscle pain or joint pain and swelling.  Skin: Denies redness, rashes, lesions or ulcercations.  Neurological: Denies dizziness, difficulty with memory, difficulty with speech or problems with balance and coordination.  Psych: Patient has a history of anxiety.  Denies depression, SI/HI.  No other specific complaints in a complete review of systems (except as listed in HPI above).  Objective:   Physical Exam   BP 128/82   Pulse 72   Ht '5\' 6"'  (1.676 m)   Wt 225 lb 12.8 oz (102.4 kg)   LMP 11/20/2019 (Approximate)   SpO2 100%   BMI 36.45 kg/m   Wt Readings from Last 3 Encounters:  09/29/20 217 lb (98.4 kg)  06/06/20 226 lb (102.5 kg)  05/18/20 220 lb (99.8 kg)    General: Appears her stated age, obese, in NAD. Skin: Warm, dry and intact. No rashesnoted. HEENT: Head: normal shape and size; Eyes: sclera white, no icterus, conjunctiva pink, PERRLA and EOMs intact;  Neck:  Neck supple, trachea midline. No masses, lumps or thyromegaly  present.  Cardiovascular: Normal rate and rhythm. S1,S2 noted.  No murmur, rubs or gallops noted. No JVD or BLE edema.  Pulmonary/Chest: Normal effort and positive vesicular breath sounds. No respiratory distress. No wheezes, rales or ronchi noted.  Abdomen: Soft and nontender. Normal bowel sounds. No distention or masses noted. Liver, spleen and kidneys non palpable. Musculoskeletal: Strength 5/5 BUE/BLE.  No difficulty with gait.  Neurological: Alert and oriented. Cranial nerves II-XII grossly intact. Coordination normal.  Psychiatric: Mood and affect normal. Behavior is normal. Judgment and thought content normal.     BMET    Component Value Date/Time   NA 140 09/29/2020 2129   NA 139 05/12/2017 0000   K 3.7 09/29/2020 2129   CL 103 09/29/2020 2129   CO2 27 09/29/2020 2129  GLUCOSE 116 (H) 09/29/2020 2129   BUN 12 09/29/2020 2129   BUN 9 05/12/2017 0000   CREATININE 0.76 09/29/2020 2129   CREATININE 0.91 06/06/2020 1618   CALCIUM 9.0 09/29/2020 2129   GFRNONAA >60 09/29/2020 2129   GFRNONAA 82 06/06/2020 1618   GFRAA 95 06/06/2020 1618    Lipid Panel     Component Value Date/Time   CHOL 184 02/19/2020 0839   CHOL 193 05/12/2017 0000   TRIG 147 02/19/2020 0839   HDL 47 (L) 02/19/2020 0839   HDL 40 05/12/2017 0000   CHOLHDL 3.9 02/19/2020 0839   LDLCALC 111 (H) 02/19/2020 0839    CBC    Component Value Date/Time   WBC 3.5 (L) 09/29/2020 2129   RBC 4.84 09/29/2020 2129   HGB 14.8 09/29/2020 2129   HGB 14.4 05/12/2017 0000   HCT 44.3 09/29/2020 2129   HCT 41.5 05/12/2017 0000   PLT 177 09/29/2020 2129   PLT 273 05/12/2017 0000   MCV 91.5 09/29/2020 2129   MCV 89 05/12/2017 0000   MCH 30.6 09/29/2020 2129   MCHC 33.4 09/29/2020 2129   RDW 12.2 09/29/2020 2129   RDW 13.3 05/12/2017 0000   LYMPHSABS 1,883 06/06/2020 1618   MONOABS 0.6 04/17/2019 1925   EOSABS 172 06/06/2020 1618   BASOSABS 52 06/06/2020 1618    Hgb A1C Lab Results  Component Value Date    HGBA1C 5.6 02/19/2020           Assessment & Plan:  Preventative Health Maintenance:  Encouraged her to get a flu shot in fall Tetanus UTD She declines COVID-vaccine She no longer needs Pap smears Encouraged her to consume a balanced diet and exercise regimen Advised her to see a dentist annually We will check CBC, c-Met, TSH, lipid, A1c and hep C today  RTC in 1 year, sooner if needed   Webb Silversmith, NP This visit occurred during the SARS-CoV-2 public health emergency.  Safety protocols were in place, including screening questions prior to the visit, additional usage of staff PPE, and extensive cleaning of exam room while observing appropriate contact time as indicated for disinfecting solutions.

## 2021-02-20 NOTE — Patient Instructions (Signed)
Health Maintenance, Female Adopting a healthy lifestyle and getting preventive care are important in promoting health and wellness. Ask your health care provider about:  The right schedule for you to have regular tests and exams.  Things you can do on your own to prevent diseases and keep yourself healthy. What should I know about diet, weight, and exercise? Eat a healthy diet  Eat a diet that includes plenty of vegetables, fruits, low-fat dairy products, and lean protein.  Do not eat a lot of foods that are high in solid fats, added sugars, or sodium.   Maintain a healthy weight Body mass index (BMI) is used to identify weight problems. It estimates body fat based on height and weight. Your health care provider can help determine your BMI and help you achieve or maintain a healthy weight. Get regular exercise Get regular exercise. This is one of the most important things you can do for your health. Most adults should:  Exercise for at least 150 minutes each week. The exercise should increase your heart rate and make you sweat (moderate-intensity exercise).  Do strengthening exercises at least twice a week. This is in addition to the moderate-intensity exercise.  Spend less time sitting. Even light physical activity can be beneficial. Watch cholesterol and blood lipids Have your blood tested for lipids and cholesterol at 36 years of age, then have this test every 5 years. Have your cholesterol levels checked more often if:  Your lipid or cholesterol levels are high.  You are older than 36 years of age.  You are at high risk for heart disease. What should I know about cancer screening? Depending on your health history and family history, you may need to have cancer screening at various ages. This may include screening for:  Breast cancer.  Cervical cancer.  Colorectal cancer.  Skin cancer.  Lung cancer. What should I know about heart disease, diabetes, and high blood  pressure? Blood pressure and heart disease  High blood pressure causes heart disease and increases the risk of stroke. This is more likely to develop in people who have high blood pressure readings, are of African descent, or are overweight.  Have your blood pressure checked: ? Every 3-5 years if you are 18-39 years of age. ? Every year if you are 40 years old or older. Diabetes Have regular diabetes screenings. This checks your fasting blood sugar level. Have the screening done:  Once every three years after age 40 if you are at a normal weight and have a low risk for diabetes.  More often and at a younger age if you are overweight or have a high risk for diabetes. What should I know about preventing infection? Hepatitis B If you have a higher risk for hepatitis B, you should be screened for this virus. Talk with your health care provider to find out if you are at risk for hepatitis B infection. Hepatitis C Testing is recommended for:  Everyone born from 1945 through 1965.  Anyone with known risk factors for hepatitis C. Sexually transmitted infections (STIs)  Get screened for STIs, including gonorrhea and chlamydia, if: ? You are sexually active and are younger than 36 years of age. ? You are older than 36 years of age and your health care provider tells you that you are at risk for this type of infection. ? Your sexual activity has changed since you were last screened, and you are at increased risk for chlamydia or gonorrhea. Ask your health care provider   if you are at risk.  Ask your health care provider about whether you are at high risk for HIV. Your health care provider may recommend a prescription medicine to help prevent HIV infection. If you choose to take medicine to prevent HIV, you should first get tested for HIV. You should then be tested every 3 months for as long as you are taking the medicine. Pregnancy  If you are about to stop having your period (premenopausal) and  you may become pregnant, seek counseling before you get pregnant.  Take 400 to 800 micrograms (mcg) of folic acid every day if you become pregnant.  Ask for birth control (contraception) if you want to prevent pregnancy. Osteoporosis and menopause Osteoporosis is a disease in which the bones lose minerals and strength with aging. This can result in bone fractures. If you are 65 years old or older, or if you are at risk for osteoporosis and fractures, ask your health care provider if you should:  Be screened for bone loss.  Take a calcium or vitamin D supplement to lower your risk of fractures.  Be given hormone replacement therapy (HRT) to treat symptoms of menopause. Follow these instructions at home: Lifestyle  Do not use any products that contain nicotine or tobacco, such as cigarettes, e-cigarettes, and chewing tobacco. If you need help quitting, ask your health care provider.  Do not use street drugs.  Do not share needles.  Ask your health care provider for help if you need support or information about quitting drugs. Alcohol use  Do not drink alcohol if: ? Your health care provider tells you not to drink. ? You are pregnant, may be pregnant, or are planning to become pregnant.  If you drink alcohol: ? Limit how much you use to 0-1 drink a day. ? Limit intake if you are breastfeeding.  Be aware of how much alcohol is in your drink. In the [place] one drink equals one 12 oz bottle of beer (355 mL), one 5 oz glass of wine (148 mL), or one 1 oz glass of hard liquor (44 mL). General instructions  Schedule regular health, dental, and eye exams.  Stay current with your vaccines.  Tell your health care provider if: ? You often feel depressed. ? You have ever been abused or do not feel safe at home. Summary  Adopting a healthy lifestyle and getting preventive care are important in promoting health and wellness.  Follow your health care provider's instructions about healthy  diet, exercising, and getting tested or screened for diseases.  Follow your health care provider's instructions on monitoring your cholesterol and blood pressure. This information is not intended to replace advice given to you by your health care provider. Make sure you discuss any questions you have with your health care provider. Document Revised: 09/21/2018 Document Reviewed: 09/21/2018 Elsevier Patient Education  2021 Elsevier Inc.  

## 2021-02-21 LAB — HEMOGLOBIN A1C
Hgb A1c MFr Bld: 5.4 % of total Hgb (ref <5.7)
Mean Plasma Glucose: 108 mg/dL
eAG (mmol/L): 6 mmol/L

## 2021-02-21 LAB — COMPREHENSIVE METABOLIC PANEL
AG Ratio: 1.5 (calc) (ref 1.0–2.5)
ALT: 21 U/L (ref 6–29)
AST: 15 U/L (ref 10–30)
Albumin: 4.1 g/dL (ref 3.6–5.1)
Alkaline phosphatase (APISO): 68 U/L (ref 31–125)
BUN: 11 mg/dL (ref 7–25)
CO2: 27 mmol/L (ref 20–32)
Calcium: 9.3 mg/dL (ref 8.6–10.2)
Chloride: 106 mmol/L (ref 98–110)
Creat: 0.78 mg/dL (ref 0.50–1.10)
Globulin: 2.7 g/dL (calc) (ref 1.9–3.7)
Glucose, Bld: 85 mg/dL (ref 65–99)
Potassium: 4.2 mmol/L (ref 3.5–5.3)
Sodium: 141 mmol/L (ref 135–146)
Total Bilirubin: 0.6 mg/dL (ref 0.2–1.2)
Total Protein: 6.8 g/dL (ref 6.1–8.1)

## 2021-02-21 LAB — CBC
HCT: 42.9 % (ref 35.0–45.0)
Hemoglobin: 14.1 g/dL (ref 11.7–15.5)
MCH: 30.4 pg (ref 27.0–33.0)
MCHC: 32.9 g/dL (ref 32.0–36.0)
MCV: 92.5 fL (ref 80.0–100.0)
MPV: 10.2 fL (ref 7.5–12.5)
Platelets: 333 10*3/uL (ref 140–400)
RBC: 4.64 10*6/uL (ref 3.80–5.10)
RDW: 12.2 % (ref 11.0–15.0)
WBC: 6.4 10*3/uL (ref 3.8–10.8)

## 2021-02-21 LAB — LIPID PANEL
Cholesterol: 187 mg/dL (ref <200)
HDL: 48 mg/dL — ABNORMAL LOW (ref >=50)
LDL Cholesterol (Calc): 114 mg/dL (calc) — ABNORMAL HIGH
Non-HDL Cholesterol (Calc): 139 mg/dL (calc) — ABNORMAL HIGH (ref <130)
Total CHOL/HDL Ratio: 3.9 (calc) (ref <5.0)
Triglycerides: 142 mg/dL (ref <150)

## 2021-02-21 LAB — HEPATITIS C ANTIBODY
Hepatitis C Ab: NONREACTIVE
SIGNAL TO CUT-OFF: 0.01 (ref <1.00)

## 2021-02-21 LAB — TSH: TSH: 2.05 mIU/L

## 2021-03-19 ENCOUNTER — Other Ambulatory Visit: Payer: Self-pay

## 2021-03-19 DIAGNOSIS — J302 Other seasonal allergic rhinitis: Secondary | ICD-10-CM

## 2021-03-19 MED ORDER — FEXOFENADINE HCL 180 MG PO TABS: 180.0000 mg | ORAL_TABLET | Freq: Every day | ORAL | 1 refills | Status: DC

## 2021-06-20 ENCOUNTER — Other Ambulatory Visit: Payer: Self-pay

## 2021-06-20 DIAGNOSIS — I10 Essential (primary) hypertension: Secondary | ICD-10-CM

## 2021-06-20 MED ORDER — LISINOPRIL 10 MG PO TABS: 10.0000 mg | ORAL_TABLET | Freq: Every day | ORAL | 3 refills | Status: DC

## 2021-09-08 DIAGNOSIS — M25562 Pain in left knee: Secondary | ICD-10-CM | POA: Diagnosis not present

## 2021-12-06 ENCOUNTER — Ambulatory Visit
Admission: EM | Admit: 2021-12-06 | Discharge: 2021-12-06 | Disposition: A | Discharge status: Home / Self Care | Payer: BC Managed Care – PPO | Attending: Family Medicine | Admitting: Family Medicine

## 2021-12-06 ENCOUNTER — Encounter: Payer: Self-pay | Admitting: Emergency Medicine

## 2021-12-06 DIAGNOSIS — J208 Acute bronchitis due to other specified organisms: Secondary | ICD-10-CM | POA: Diagnosis not present

## 2021-12-06 MED ORDER — PROMETHAZINE-DM 6.25-15 MG/5ML PO SYRP: 5.0000 mL | ORAL_SOLUTION | Freq: Four times a day (QID) | ORAL | 0 refills | Status: DC | PRN

## 2021-12-06 MED ORDER — PREDNISONE 20 MG PO TABS: 40.0000 mg | ORAL_TABLET | Freq: Every day | ORAL | 0 refills | Status: DC

## 2021-12-06 MED ORDER — ALBUTEROL SULFATE HFA 108 (90 BASE) MCG/ACT IN AERS: 2.0000 | INHALATION_SPRAY | RESPIRATORY_TRACT | 0 refills | Status: DC | PRN

## 2021-12-06 NOTE — ED Provider Notes (Signed)
[name]    CSN: [id number] Arrival date & time: 12/06/21  1037      History   Chief Complaint Chief Complaint  Patient presents with   Cough   Nasal Congestion   Sore Throat    HPI Stacey Huynh is a 36 y.o. female.   HPI Patient presents today for evaluation of cough, congestion, and sore throat x 5 days. Patient endorses symptoms have worsened and she is now experiencing chest congestion and tightness. She had sore throat, which has since resolved. No fever. No known sick contacts.  Past Medical History:  Diagnosis Date   Abnormal Pap smear    Abnormal Pap smear of cervix    Anxiety    Hypertension     Patient Active Problem List   Diagnosis Date Noted   GERD (gastroesophageal reflux disease) 02/20/2021   Anxiety 02/19/2020   Essential hypertension 03/25/2018    Past Surgical History:  Procedure Laterality Date   CYSTOSCOPY N/A 01/25/2020   Procedure: CYSTOSCOPY;  Surgeon: Vena Austria, MD;  Location: [facility];  Service: Gynecology;  Laterality: N/A;   LAPAROSCOPIC OVARIAN CYSTECTOMY Right 04/13/2019   Procedure: LAPAROSCOPIC RIGHT OVARIAN CYSTECTOMY and removal of right paratubal cyst;  Surgeon: Vena Austria, MD;  Location: [facility];  Service: Gynecology;  Laterality: Right;   LEEP     TOTAL LAPAROSCOPIC HYSTERECTOMY WITH SALPINGECTOMY N/A 01/25/2020   Procedure: TOTAL LAPAROSCOPIC HYSTERECTOMY WITH BILATERAL SALPINGECTOMY;  Surgeon: Vena Austria, MD;  Location: [facility];  Service: Gynecology;  Laterality: N/A;   WISDOM TOOTH EXTRACTION      OB History     Gravida  1   Para  1   Term  1   Preterm      AB      Living  1      SAB      IAB      Ectopic      Multiple      Live Births  1            Home Medications    Prior to Admission medications   Medication Sig Start Date End Date Taking? Authorizing Provider  albuterol (VENTOLIN HFA) 108 (90 Base) MCG/ACT inhaler Inhale 2 puffs into the lungs every  4 (four) hours as needed for wheezing or shortness of breath. 09/30/20   Irean Hong, MD  fexofenadine (ALLEGRA) 180 MG tablet Take 1 tablet (180 mg total) by mouth daily. 03/19/21   Lorre Munroe, NP  lisinopril (ZESTRIL) 10 MG tablet Take 1 tablet (10 mg total) by mouth daily. 06/20/21   Lorre Munroe, NP    Family History Family History  Problem Relation Age of Onset   Pancreatic cancer Paternal Aunt    Bone cancer Maternal Grandmother    Lung cancer Maternal Grandmother    Colon cancer Paternal Grandfather    Depression Mother    Depression Father    Suicidality Father     Social History Social History   Tobacco Use   Smoking status: Never   Smokeless tobacco: Never  Vaping Use   Vaping Use: Never used  Substance Use Topics   Alcohol use: Yes    Comment: rare   Drug use: No     Allergies   Penicillins   Review of Systems Review of Systems Pertinent negatives listed in HPI   Physical Exam Triage Vital Signs ED Triage Vitals  Enc Vitals Group     BP 12/06/21 1047 (!)  141/89     Pulse Rate 12/06/21 1047 90     Resp 12/06/21 1047 18     Temp 12/06/21 1047 98.6 F (37 C)     Temp src --      SpO2 12/06/21 1047 98 %     Weight --      Height --      Head Circumference --      Peak Flow --      Pain Score 12/06/21 1050 4     Pain Loc --      Pain Edu? --      Excl. in GC? --    No data found.  Updated Vital Signs BP (!) 141/89    Pulse 90    Temp 98.6 F (37 C)    Resp 18    LMP 11/20/2019 (Approximate)    SpO2 98%   Visual Acuity Right Eye Distance:   Left Eye Distance:   Bilateral Distance:    Right Eye Near:   Left Eye Near:    Bilateral Near:     Physical Exam  General Appearance:    Alert, acutely -ill appearing, cooperative  HENT:   Normocephalic, ears normal, nares mucosal edema with congestion, rhinorrhea, oropharynx    Eyes:    PERRL, conjunctiva/corneas clear, EOM's intact       Lungs:     Coarse lungs to auscultation bilaterally  w/o wheezing, rales, or rhonchi, respirations unlabored  Heart:    Regular rate and rhythm  Neurologic:   Awake, alert, oriented x 3. No apparent focal neurological           defect.     UC Treatments / Results  Labs (all labs ordered are listed, but only abnormal results are displayed) Labs Reviewed - No data to display  EKG   Radiology No results found.  Procedures Procedures (including critical care time)  Medications Ordered in UC Medications - No data to display  Initial Impression / Assessment and Plan / UC Course  I have reviewed the triage vital signs and the nursing notes.  Pertinent labs & imaging results that were available during my care of the patient were reviewed by me and considered in my medical decision making (see chart for details).    Acute bronchitis, viral  Start prednisone 40 mg daily x 5 days Albuterol 2 puffs every 4-6 hours as needed for wheezing Promethazine DM PRN for cough Work note  provided Strict return precaution, if symptoms worsen or do no readily improve. Final Clinical Impressions(s) / UC Diagnoses   Final diagnoses:  Acute bronchitis due to other specified organisms   Discharge Instructions   None    ED Prescriptions     Medication Sig Dispense Auth. Provider   albuterol (VENTOLIN HFA) 108 (90 Base) MCG/ACT inhaler  (Status: Discontinued) Inhale 2 puffs into the lungs every 4 (four) hours as needed for wheezing or shortness of breath. 18 g Bing Neighbors, FNP   predniSONE (DELTASONE) 20 MG tablet Take 2 tablets (40 mg total) by mouth daily with breakfast. 10 tablet Bing Neighbors, FNP   promethazine-dextromethorphan (PROMETHAZINE-DM) 6.25-15 MG/5ML syrup Take 5 mLs by mouth 4 (four) times daily as needed for cough. 180 mL Bing Neighbors, FNP   albuterol (VENTOLIN HFA) 108 (90 Base) MCG/ACT inhaler Inhale 2 puffs into the lungs every 4 (four) hours as needed for wheezing or shortness of breath. 18 g Bing Neighbors,  FNP  PDMP not reviewed this encounter.   Bing Neighbors, FNP 12/06/21 1122

## 2021-12-06 NOTE — ED Triage Notes (Signed)
Pt here with cough, congestion and resolved sore throat x 5 days. No fevers.

## 2021-12-19 IMAGING — US US SOFT TISSUE HEAD/NECK
1 series · 14 of 17 positions shown · non-contrast
Comparison: None.

CLINICAL DATA: Left-sided neck swelling

EXAM:
ULTRASOUND OF HEAD/NECK SOFT TISSUES
TECHNIQUE: Ultrasound examination of the head and neck soft tissues was
performed in the area of clinical concern.

[Series 1: us soft tissue head & neck (non-thyroid) · 14 of 17 slices shown]
[im 1/17]
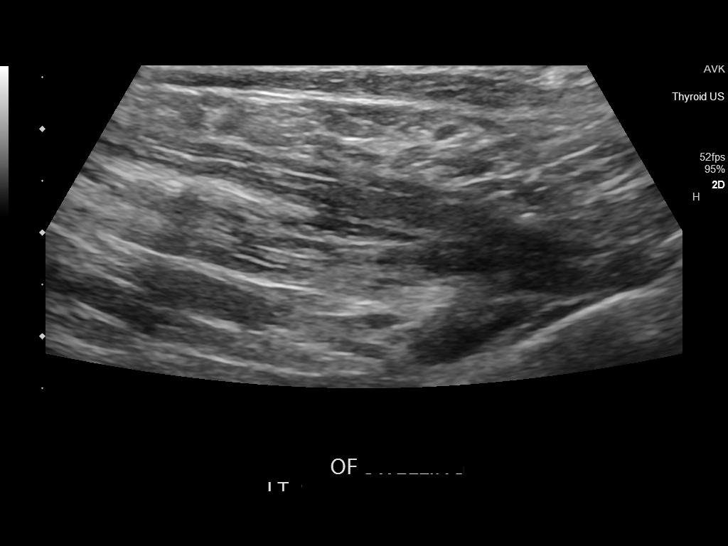
[im 2/17]
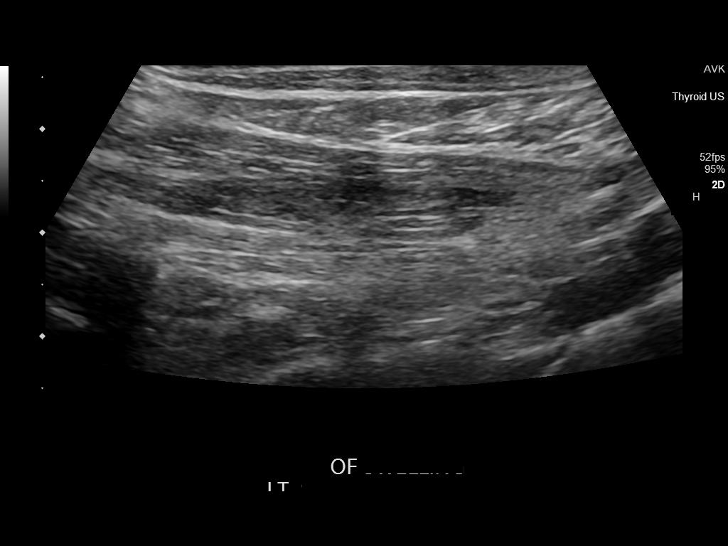
[im 4/17]
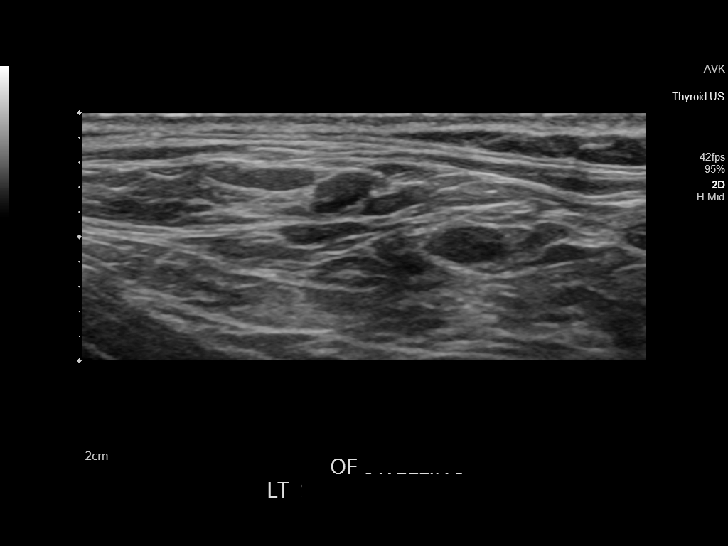
[im 5/17]
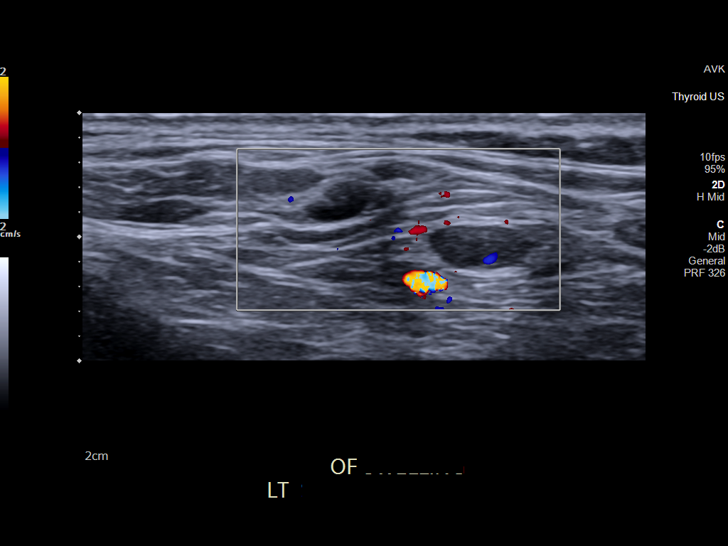
[im 6/17]
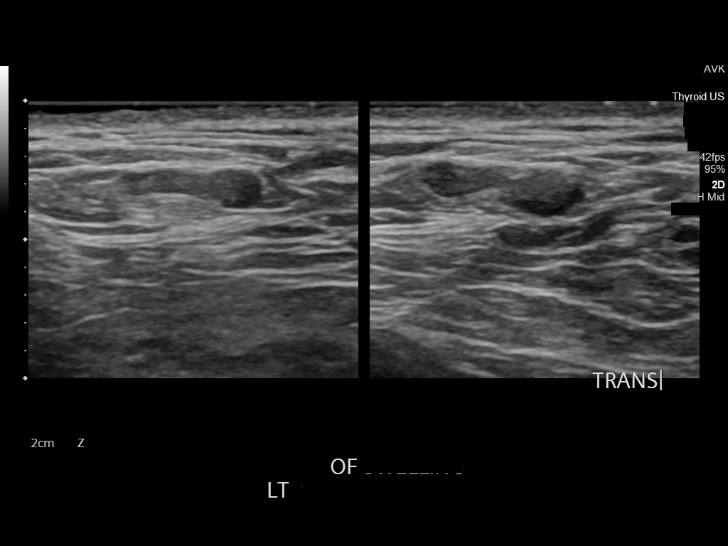
[im 7/17]
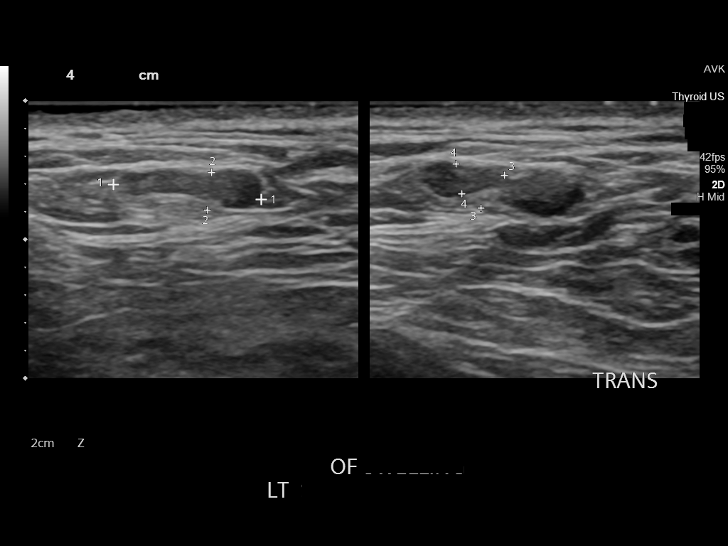
[im 8/17]
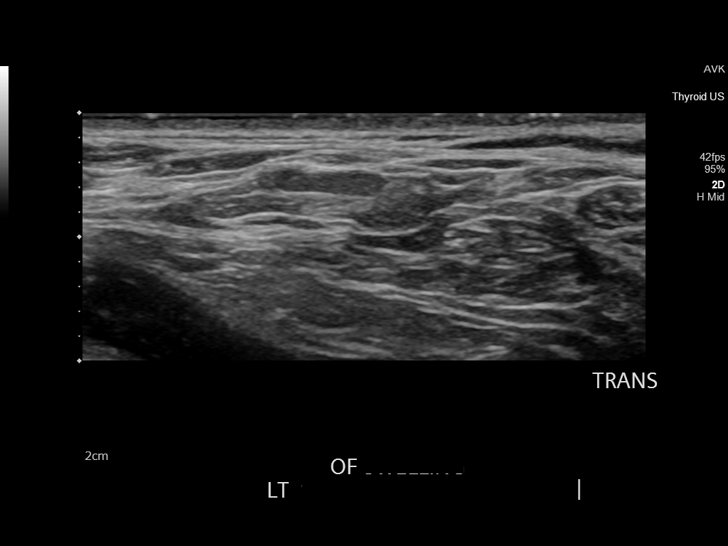
[im 10/17]
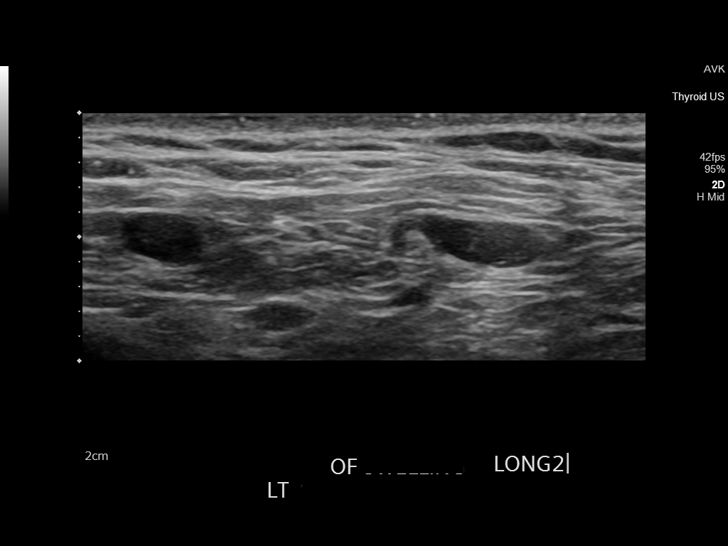
[im 11/17]
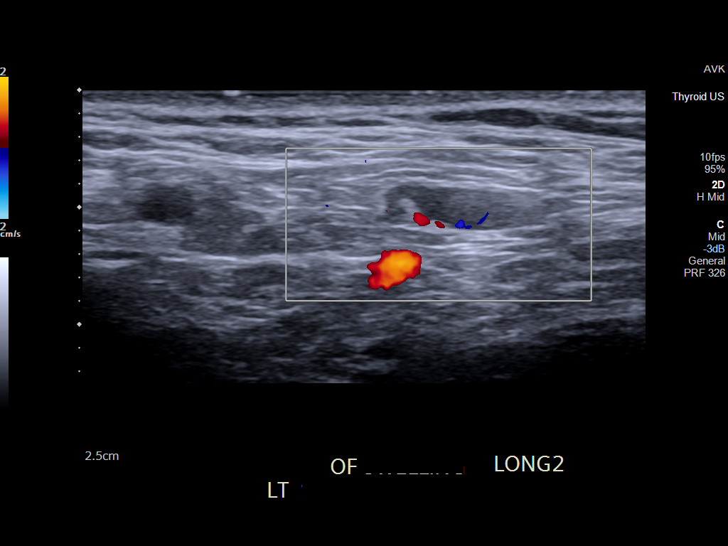
[im 12/17]
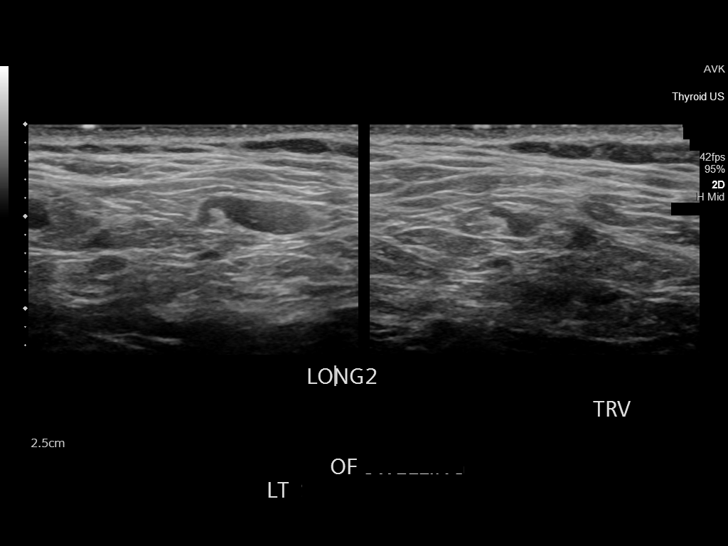
[im 13/17]
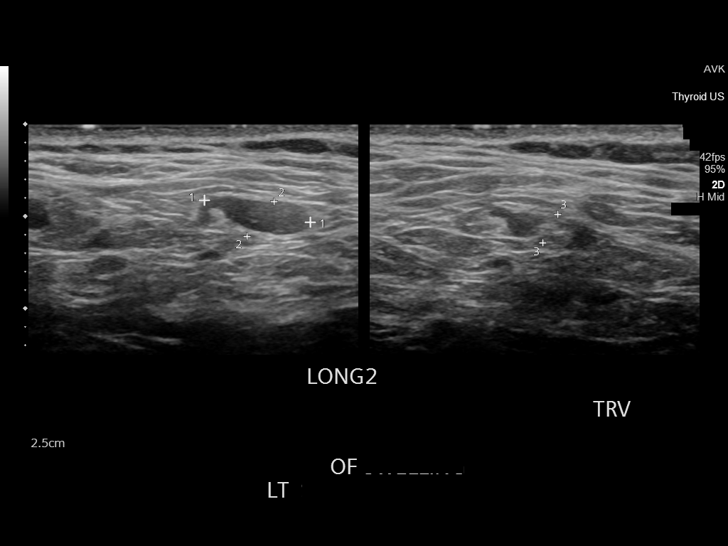
[im 14/17]
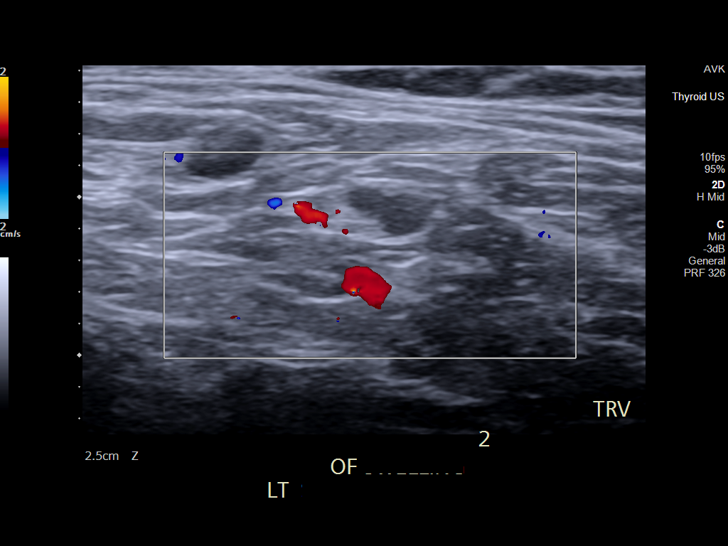
[im 16/17]
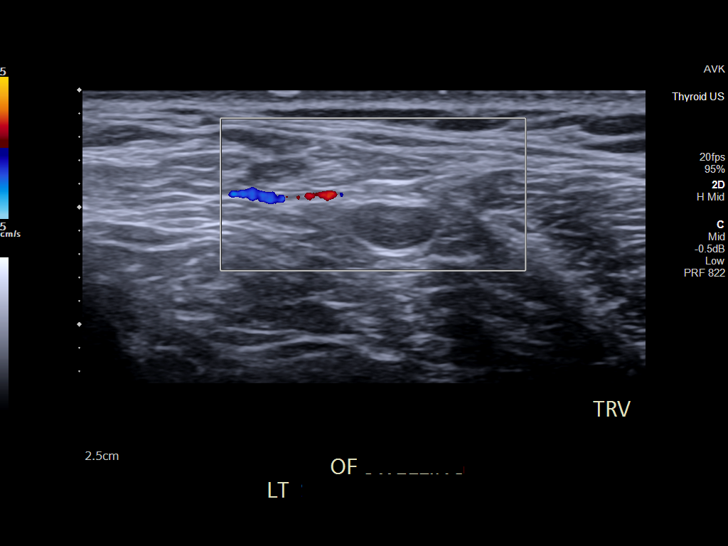
[im 17/17]
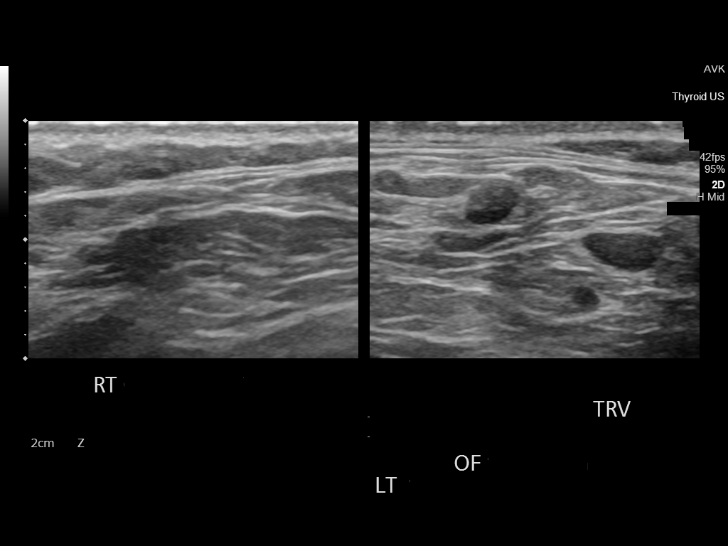

[14 of 17 positions shown; findings below may reference images not displayed]

FINDINGS: Benign-appearing lymph nodes in the left supraclavicular region,
measuring up to 12 x 5 x 4 mm.
IMPRESSION: Benign-appearing lymph nodes in the left supraclavicular region.

## 2022-01-29 ENCOUNTER — Encounter: Payer: Self-pay | Admitting: Internal Medicine

## 2022-03-23 ENCOUNTER — Ambulatory Visit
Admission: RE | Admit: 2022-03-23 | Discharge: 2022-03-23 | Disposition: A | Discharge status: Home / Self Care | Payer: BC Managed Care – PPO | Source: Ambulatory Visit | Attending: Emergency Medicine | Admitting: Emergency Medicine

## 2022-03-23 ENCOUNTER — Ambulatory Visit (INDEPENDENT_AMBULATORY_CARE_PROVIDER_SITE_OTHER): Payer: BC Managed Care – PPO

## 2022-03-23 VITALS — BP 127/87 | HR 96 | Temp 98.1°F | Resp 18

## 2022-03-23 DIAGNOSIS — R058 Other specified cough: Secondary | ICD-10-CM

## 2022-03-23 DIAGNOSIS — R0602 Shortness of breath: Secondary | ICD-10-CM | POA: Diagnosis not present

## 2022-03-23 DIAGNOSIS — J189 Pneumonia, unspecified organism: Secondary | ICD-10-CM

## 2022-03-23 DIAGNOSIS — R059 Cough, unspecified: Secondary | ICD-10-CM | POA: Diagnosis not present

## 2022-03-23 DIAGNOSIS — R0600 Dyspnea, unspecified: Secondary | ICD-10-CM | POA: Diagnosis not present

## 2022-03-23 MED ORDER — AZITHROMYCIN 250 MG PO TABS: 250.0000 mg | ORAL_TABLET | Freq: Every day | ORAL | 0 refills | Status: DC

## 2022-03-23 MED ORDER — CEFDINIR 300 MG PO CAPS: 300.0000 mg | ORAL_CAPSULE | Freq: Two times a day (BID) | ORAL | 0 refills | Status: AC

## 2022-03-23 NOTE — ED Provider Notes (Signed)
Stacey Huynh    CSN: [id number] Arrival date & time: 03/23/22  1036      History   Chief Complaint Chief Complaint  Patient presents with   Cough    I have a tendency to get bronchitis and I'm pretty sure I have it again. I have a congested cough and spitting up some mucous. I'm also getting short of breath easily. The congestion is all sitting in my chest as well. - Entered by patient    HPI Stacey Huynh is a 36 y.o. female.  Patient presents with 1 week history of congestion and productive cough.  She states that the congestion feels like it has settled in her chest; she feels short of breath and chest tightness when she coughs.  She also reports fever of 101 this morning.  She denies ear pain, sore throat, vomiting, diarrhea, or other symptoms.  She does not smoke or vape.  She reports history of bronchitis.  The history is provided by the patient and medical records.    Past Medical History:  Diagnosis Date   Abnormal Pap smear    Abnormal Pap smear of cervix    Anxiety    Hypertension     Patient Active Problem List   Diagnosis Date Noted   GERD (gastroesophageal reflux disease) 02/20/2021   Anxiety 02/19/2020   Essential hypertension 03/25/2018    Past Surgical History:  Procedure Laterality Date   CYSTOSCOPY N/A 01/25/2020   Procedure: CYSTOSCOPY;  Surgeon: Vena Austria, MD;  Location: [facility];  Service: Gynecology;  Laterality: N/A;   LAPAROSCOPIC OVARIAN CYSTECTOMY Right 04/13/2019   Procedure: LAPAROSCOPIC RIGHT OVARIAN CYSTECTOMY and removal of right paratubal cyst;  Surgeon: Vena Austria, MD;  Location: [facility];  Service: Gynecology;  Laterality: Right;   LEEP     TOTAL LAPAROSCOPIC HYSTERECTOMY WITH SALPINGECTOMY N/A 01/25/2020   Procedure: TOTAL LAPAROSCOPIC HYSTERECTOMY WITH BILATERAL SALPINGECTOMY;  Surgeon: Vena Austria, MD;  Location: [facility];  Service: Gynecology;  Laterality: N/A;   WISDOM TOOTH EXTRACTION      OB  History     Gravida  1   Para  1   Term  1   Preterm      AB      Living  1      SAB      IAB      Ectopic      Multiple      Live Births  1            Home Medications    Prior to Admission medications   Medication Sig Start Date End Date Taking? Authorizing Provider  azithromycin (ZITHROMAX) 250 MG tablet Take 1 tablet (250 mg total) by mouth daily. Take first 2 tablets together, then 1 every day until finished. 03/23/22  Yes Mickie Bail, NP  cefdinir (OMNICEF) 300 MG capsule Take 1 capsule (300 mg total) by mouth 2 (two) times daily for 7 days. 03/23/22 03/30/22 Yes Mickie Bail, NP  albuterol (VENTOLIN HFA) 108 (90 Base) MCG/ACT inhaler Inhale 2 puffs into the lungs every 4 (four) hours as needed for wheezing or shortness of breath. 12/06/21   Bing Neighbors, FNP  fexofenadine (ALLEGRA) 180 MG tablet Take 1 tablet (180 mg total) by mouth daily. 03/19/21   Lorre Munroe, NP  lisinopril (ZESTRIL) 10 MG tablet Take 1 tablet (10 mg total) by mouth daily. 06/20/21   Lorre Munroe, NP  predniSONE (DELTASONE) 20 MG tablet Take  2 tablets (40 mg total) by mouth daily with breakfast. 12/06/21   Bing NeighborsHarris, Kimberly S, FNP  promethazine-dextromethorphan (PROMETHAZINE-DM) 6.25-15 MG/5ML syrup Take 5 mLs by mouth 4 (four) times daily as needed for cough. 12/06/21   Bing NeighborsHarris, Kimberly S, FNP    Family History Family History  Problem Relation Age of Onset   Pancreatic cancer Paternal Aunt    Bone cancer Maternal Grandmother    Lung cancer Maternal Grandmother    Colon cancer Paternal Grandfather    Depression Mother    Depression Father    Suicidality Father     Social History Social History   Tobacco Use   Smoking status: Never   Smokeless tobacco: Never  Vaping Use   Vaping Use: Never used  Substance Use Topics   Alcohol use: Yes    Comment: rare   Drug use: No     Allergies   Penicillins   Review of Systems Review of Systems  Constitutional:  Positive  for fever. Negative for chills.  HENT:  Positive for congestion. Negative for ear pain and sore throat.   Respiratory:  Positive for cough, chest tightness and shortness of breath.   Cardiovascular:  Negative for chest pain and palpitations.  Gastrointestinal:  Negative for diarrhea and vomiting.  Skin:  Negative for color change and rash.  Neurological:  Negative for syncope.  All other systems reviewed and are negative.    Physical Exam Triage Vital Signs ED Triage Vitals [03/23/22 1053]  Enc Vitals Group     BP 127/87     Pulse Rate 96     Resp 18     Temp 98.1 F (36.7 C)     Temp src      SpO2 98 %     Weight      Height      Head Circumference      Peak Flow      Pain Score      Pain Loc      Pain Edu?      Excl. in GC?    No data found.  Updated Vital Signs BP 127/87   Pulse 96   Temp 98.1 F (36.7 C)   Resp 18   LMP 11/09/2019   SpO2 98%   Visual Acuity Right Eye Distance:   Left Eye Distance:   Bilateral Distance:    Right Eye Near:   Left Eye Near:    Bilateral Near:     Physical Exam Vitals and nursing note reviewed.  Constitutional:      General: She is not in acute distress.    Appearance: She is well-developed. She is obese. She is not ill-appearing.  HENT:     Right Ear: Tympanic membrane normal.     Left Ear: Tympanic membrane normal.     Nose: Nose normal.     Mouth/Throat:     Mouth: Mucous membranes are moist.     Pharynx: Oropharynx is clear.  Cardiovascular:     Rate and Rhythm: Normal rate and regular rhythm.     Heart sounds: Normal heart sounds.  Pulmonary:     Effort: Pulmonary effort is normal. No respiratory distress.     Breath sounds: Normal breath sounds. No wheezing or rhonchi.  Musculoskeletal:     Cervical back: Neck supple.  Skin:    General: Skin is warm and dry.  Neurological:     Mental Status: She is alert.  Psychiatric:  Mood and Affect: Mood normal.        Behavior: Behavior normal.      UC  Treatments / Results  Labs (all labs ordered are listed, but only abnormal results are displayed) Labs Reviewed - No data to display  EKG   Radiology DG Chest 2 View  Result Date: 03/23/2022 CLINICAL DATA:  Productive cough and shortness of breath. EXAM: CHEST - 2 VIEW COMPARISON:  Two-view chest x-ray 03/23/2022 and 09/29/2020 FINDINGS: Heart size is normal. Asymmetric density is present over the right lower lobe. Left lung is clear. Upper lungs are scratched at the upper lobes are clear bilaterally. Axial skeleton is within normal limits. IMPRESSION: Asymmetric density over the right lower lobe concerning for pneumonia. Recommend follow-up two-view chest x-ray after appropriate therapy and clearing of symptoms. Electronically Signed   By: Marin Roberts M.D.   On: 03/23/2022 11:50   DG Chest 2 View  Result Date: 03/23/2022 CLINICAL DATA:  Productive cough, dyspnea. EXAM: CHEST - 2 VIEW COMPARISON:  September 29, 2018. FINDINGS: The heart size and mediastinal contours are within normal limits. Nodular density is seen over right lung base which may represent overlying nipple shadow. Left lung is clear. The visualized skeletal structures are unremarkable. IMPRESSION: Nodular density seen over right lung base which may represent overlying nipple shadow; repeat radiograph with nipple markers is recommended to rule out pulmonary nodule. No other abnormality is seen. Electronically Signed   By: Lupita Raider M.D.   On: 03/23/2022 11:35    Procedures Procedures (including critical care time)  Medications Ordered in UC Medications - No data to display  Initial Impression / Assessment and Plan / UC Course  I have reviewed the triage vital signs and the nursing notes.  Pertinent labs & imaging results that were available during my care of the patient were reviewed by me and considered in my medical decision making (see chart for details).    Productive cough, RLL pneumonia.  No respiratory  distress.  O2 sat 98% on room air.  Patient is allergic to penicillin.  Treating with cefdinir and Zithromax.  Education provided on pneumonia.  ED precautions discussed.  Instructed patient to follow-up with her PCP in 2 to 3 days for recheck.  She agrees to plan of care.  Final Clinical Impressions(s) / UC Diagnoses   Final diagnoses:  Productive cough  Pneumonia of right lower lobe due to infectious organism     Discharge Instructions      Take the cefdinir and Zithromax as directed.  Follow up with your primary care provider in 2-3 days for a recheck.  Go to the emergency department if you have acute shortness of breath or other concerning symptoms.      ED Prescriptions     Medication Sig Dispense Auth. Provider   azithromycin (ZITHROMAX) 250 MG tablet Take 1 tablet (250 mg total) by mouth daily. Take first 2 tablets together, then 1 every day until finished. 6 tablet Mickie Bail, NP   cefdinir (OMNICEF) 300 MG capsule Take 1 capsule (300 mg total) by mouth 2 (two) times daily for 7 days. 14 capsule Mickie Bail, NP      PDMP not reviewed this encounter.   Mickie Bail, NP 03/23/22 [phone]

## 2022-03-23 NOTE — ED Triage Notes (Signed)
Patient presents to Urgent Care with complaints of a productive cough, SOB, chest tightness x 1 week. Fever today. Concerned with bronchitis. Treating symptoms with ibuprofen.

## 2022-03-23 NOTE — Discharge Instructions (Addendum)
Take the cefdinir and Zithromax as directed.  Follow up with your primary care provider in 2-3 days for a recheck.  Go to the emergency department if you have acute shortness of breath or other concerning symptoms.

## 2022-03-24 ENCOUNTER — Encounter: Payer: Self-pay | Admitting: Internal Medicine

## 2022-03-26 ENCOUNTER — Ambulatory Visit: Payer: BC Managed Care – PPO | Admitting: Internal Medicine

## 2022-03-26 ENCOUNTER — Encounter: Payer: Self-pay | Admitting: Internal Medicine

## 2022-03-26 VITALS — BP 126/88 | HR 85 | Temp 97.3°F | Wt 224.0 lb

## 2022-03-26 DIAGNOSIS — J189 Pneumonia, unspecified organism: Secondary | ICD-10-CM | POA: Diagnosis not present

## 2022-03-26 MED ORDER — ALBUTEROL SULFATE HFA 108 (90 BASE) MCG/ACT IN AERS: 2.0000 | INHALATION_SPRAY | Freq: Four times a day (QID) | RESPIRATORY_TRACT | 0 refills | Status: DC | PRN

## 2022-03-26 MED ORDER — HYDROCOD POLI-CHLORPHE POLI ER 10-8 MG/5ML PO SUER: 5.0000 mL | Freq: Two times a day (BID) | ORAL | 0 refills | Status: DC | PRN

## 2022-03-26 NOTE — Progress Notes (Signed)
Subjective:    Patient ID: Stacey Huynh, female    DOB: Oct 13, 1985, 36 y.o.   MRN: [id number]  HPI  Patient presents to clinic today for urgent care follow-up.  She presented to the urgent care 6/12 with complaint of cough and shortness of breath.  Chest x-ray was concerning for a right lower lobe pneumonia.  She was started on Azithromycin and Cefdinir.  She was discharged and advised to follow-up with her PCP.  She reports she has been taking these medications as prescribed.  She reports persistent cough and shortness of breath that is worse at night and keeping her up. She is no longer running fevers.  Review of Systems     Past Medical History:  Diagnosis Date   Abnormal Pap smear    Abnormal Pap smear of cervix    Anxiety    Hypertension     Current Outpatient Medications  Medication Sig Dispense Refill   albuterol (VENTOLIN HFA) 108 (90 Base) MCG/ACT inhaler Inhale 2 puffs into the lungs every 4 (four) hours as needed for wheezing or shortness of breath. 18 g 0   azithromycin (ZITHROMAX) 250 MG tablet Take 1 tablet (250 mg total) by mouth daily. Take first 2 tablets together, then 1 every day until finished. 6 tablet 0   cefdinir (OMNICEF) 300 MG capsule Take 1 capsule (300 mg total) by mouth 2 (two) times daily for 7 days. 14 capsule 0   fexofenadine (ALLEGRA) 180 MG tablet Take 1 tablet (180 mg total) by mouth daily. 90 tablet 1   lisinopril (ZESTRIL) 10 MG tablet Take 1 tablet (10 mg total) by mouth daily. 90 tablet 3   predniSONE (DELTASONE) 20 MG tablet Take 2 tablets (40 mg total) by mouth daily with breakfast. 10 tablet 0   promethazine-dextromethorphan (PROMETHAZINE-DM) 6.25-15 MG/5ML syrup Take 5 mLs by mouth 4 (four) times daily as needed for cough. 180 mL 0   No current facility-administered medications for this visit.    Allergies  Allergen Reactions   Penicillins Other (See Comments)    Did it involve swelling of the face/tongue/throat, SOB, or low BP?  Unknown Did it involve sudden or severe rash/hives, skin peeling, or any reaction on the inside of your mouth or nose? Unknown Did you need to seek medical attention at a hospital or doctor's office? Unknown When did it last happen? Childhood allergy If all above answers are "NO", may proceed with cephalosporin use.      Family History  Problem Relation Age of Onset   Pancreatic cancer Paternal Aunt    Bone cancer Maternal Grandmother    Lung cancer Maternal Grandmother    Colon cancer Paternal Grandfather    Depression Mother    Depression Father    Suicidality Father     Social History   Socioeconomic History   Marital status: Married    Spouse name: Not on file   Number of children: Not on file   Years of education: Not on file   Highest education level: Not on file  Occupational History   Not on file  Tobacco Use   Smoking status: Never   Smokeless tobacco: Never  Vaping Use   Vaping Use: Never used  Substance and Sexual Activity   Alcohol use: Yes    Comment: rare   Drug use: No   Sexual activity: Yes    Birth control/protection: Surgical    Comment: Hysterectomy  Other Topics Concern   Not on file  Social  History Narrative   Not on file   Social Determinants of Health   Financial Resource Strain: Not on file  Food Insecurity: Not on file  Transportation Needs: Not on file  Physical Activity: Not on file  Stress: Not on file  Social Connections: Not on file  Intimate Partner Violence: Not on file     Constitutional: Denies fever, malaise, fatigue, headache or abrupt weight changes.  HEENT: Denies eye pain, eye redness, ear pain, ringing in the ears, wax buildup, runny nose, nasal congestion, bloody nose, or sore throat. Respiratory: Patient reports cough and shortness of breath.  Denies difficulty breathing.   Cardiovascular: Denies chest pain, chest tightness, palpitations or swelling in the hands or feet.  Gastrointestinal: Denies abdominal pain,  bloating, constipation, diarrhea or blood in the stool.   No other specific complaints in a complete review of systems (except as listed in HPI above).  Objective:   Physical Exam  BP 126/88 (BP Location: Left Arm, Patient Position: Sitting, Cuff Size: Normal)   Pulse 85   Temp (!) 97.3 F (36.3 C) (Temporal)   Wt 224 lb (101.6 kg)   LMP 11/09/2019   SpO2 99%   BMI 36.15 kg/m   Wt Readings from Last 3 Encounters:  02/20/21 225 lb 12.8 oz (102.4 kg)  09/29/20 217 lb (98.4 kg)  06/06/20 226 lb (102.5 kg)    General: Appears her stated age, obese, in NAD. HEENT: Head: normal shape and size; Eyes: sclera white, no icterus, conjunctiva pink, PERRLA and EOMs intact;  Neck: No adenopathy noted. Cardiovascular: Normal rate and rhythm. S1,S2 noted.  No murmur, rubs or gallops noted.  Pulmonary/Chest: Normal effort and positive vesicular breath sounds with intermittent wheezing in the RUL. No respiratory distress. No  rales or ronchi noted.  Neurological: Alert and oriented.    BMET    Component Value Date/Time   NA 141 02/20/2021 0829   NA 139 05/12/2017 0000   K 4.2 02/20/2021 0829   CL 106 02/20/2021 0829   CO2 27 02/20/2021 0829   GLUCOSE 85 02/20/2021 0829   BUN 11 02/20/2021 0829   BUN 9 05/12/2017 0000   CREATININE 0.78 02/20/2021 0829   CALCIUM 9.3 02/20/2021 0829   GFRNONAA >60 09/29/2020 2129   GFRNONAA 82 06/06/2020 1618   GFRAA 95 06/06/2020 1618    Lipid Panel     Component Value Date/Time   CHOL 187 02/20/2021 0829   CHOL 193 05/12/2017 0000   TRIG 142 02/20/2021 0829   HDL 48 (L) 02/20/2021 0829   HDL 40 05/12/2017 0000   CHOLHDL 3.9 02/20/2021 0829   LDLCALC 114 (H) 02/20/2021 0829    CBC    Component Value Date/Time   WBC 6.4 02/20/2021 0829   RBC 4.64 02/20/2021 0829   HGB 14.1 02/20/2021 0829   HGB 14.4 05/12/2017 0000   HCT 42.9 02/20/2021 0829   HCT 41.5 05/12/2017 0000   PLT 333 02/20/2021 0829   PLT 273 05/12/2017 0000   MCV 92.5  02/20/2021 0829   MCV 89 05/12/2017 0000   MCH 30.4 02/20/2021 0829   MCHC 32.9 02/20/2021 0829   RDW 12.2 02/20/2021 0829   RDW 13.3 05/12/2017 0000   LYMPHSABS 1,883 06/06/2020 1618   MONOABS 0.6 04/17/2019 1925   EOSABS 172 06/06/2020 1618   BASOSABS 52 06/06/2020 1618    Hgb A1C Lab Results  Component Value Date   HGBA1C 5.4 02/20/2021  Assessment & Plan:   Urgent Care follow-up for RLL Pneumonia:  Urgent care notes and imaging reviewed She will continue antibiotics as prescribed Rx for Tussionex cough syrup every 8 hours as needed-sedation caution given Rx for Albuterol as needed for cough and wheezing No indication for repeat x-ray at this time  Please schedule an appointment for your annual exam Nicki Reaper, NP

## 2022-03-26 NOTE — Patient Instructions (Signed)
Community-Acquired Pneumonia, Adult Pneumonia is an infection of the lungs. It causes irritation and swelling in the airways of the lungs. Mucus and fluid may also build up inside the airways. This may cause coughing and trouble breathing. One type of pneumonia can happen while you are in a hospital. A different type can happen when you are not in a hospital (community-acquired pneumonia). What are the causes?  This condition is caused by germs (viruses, bacteria, or fungi). Some types of germs can spread from person to person. Pneumonia is not thought to spread from person to person. What increases the risk? You are more likely to develop this condition if: You have a long-term (chronic) disease, such as: Disease of the lungs. This may be chronic obstructive pulmonary disease (COPD) or asthma. Heart failure. Cystic fibrosis. Diabetes. Kidney disease. Sickle cell disease. HIV. You have other health problems, such as: Your body's defense system (immune system) is weak. A condition that may cause you to breathe in fluids from your mouth and nose. You had your spleen taken out. You do not take good care of your teeth and mouth (poor dental hygiene). You use or have used tobacco products. You travel where the germs that cause this illness are common. You are near certain animals or the places they live. You are older than 37 years of age. What are the signs or symptoms? Symptoms of this condition include: A cough. A fever. Sweating or chills. Chest pain, often when you breathe deeply or cough. Breathing problems, such as: Fast breathing. Trouble breathing. Shortness of breath. Feeling tired (fatigued). Muscle aches. How is this treated? Treatment for this condition depends on many things, such as: The cause of your illness. Your medicines. Your other health problems. Most adults can be treated at home. Sometimes, treatment must happen in a hospital. Treatment may include  medicines to kill germs. Medicines may depend on which germ caused your illness. Very bad pneumonia is rare. If you get it, you may: Have a machine to help you breathe. Have fluid taken away from around your lungs. Follow these instructions at home: Medicines Take over-the-counter and prescription medicines only as told by your doctor. Take cough medicine only if you are losing sleep. Cough medicine can keep your body from taking mucus away from your lungs. If you were prescribed an antibiotic medicine, take it as told by your doctor. Do not stop taking the antibiotic even if you start to feel better. Lifestyle     Do not drink alcohol. Do not use any products that contain nicotine or tobacco, such as cigarettes, e-cigarettes, and chewing tobacco. If you need help quitting, ask your doctor. Eat a healthy diet. This includes a lot of vegetables, fruits, whole grains, low-fat dairy products, and low-fat (lean) protein. General instructions  Rest a lot. Sleep for at least 8 hours each night. Sleep with your head and neck raised. Put a few pillows under your head or sleep in a reclining chair. Return to your normal activities as told by your doctor. Ask your doctor what activities are safe for you. Drink enough fluid to keep your pee (urine) pale yellow. If your throat is sore, rinse your mouth often with salt water. To make salt water, dissolve -1 tsp (3-6 g) of salt in 1 cup (237 mL) of warm water. Keep all follow-up visits as told by your doctor. This is important. How is this prevented? You can lower your risk of pneumonia by: Getting the pneumonia shot (vaccine). These   shots have different types and schedules. Ask your doctor what works best for you. Think about getting this shot if: You are older than 37 years of age. You are 19-65 years of age and: You are being treated for cancer. You have long-term lung disease. You have other problems that affect your body's defense system. Ask  your doctor if you have one of these. Getting your flu shot every year. Ask your doctor which type of shot is best for you. Going to the dentist as often as told. Washing your hands often with soap and water for at least 20 seconds. If you cannot use soap and water, use hand sanitizer. Contact a doctor if: You have a fever. You lose sleep because your cough medicine does not help. Get help right away if: You are short of breath and this gets worse. You have more chest pain. Your sickness gets worse. This is very serious if: You are an older adult. Your body's defense system is weak. You cough up blood. These symptoms may be an emergency. Do not wait to see if the symptoms will go away. Get medical help right away. Call your local emergency services (911 in the [place] Do not drive yourself to the hospital. Summary Pneumonia is an infection of the lungs. Community-acquired pneumonia affects people who have not been in the hospital. Certain germs can cause this infection. This condition may be treated with medicines that kill germs. For very bad pneumonia, you may need a hospital stay and treatment to help with breathing. This information is not intended to replace advice given to you by your health care provider. Make sure you discuss any questions you have with your health care provider. Document Revised: 07/11/2019 Document Reviewed: 07/11/2019 Elsevier Patient Education  2023 [employer]  

## 2022-04-17 ENCOUNTER — Other Ambulatory Visit: Payer: Self-pay | Admitting: Internal Medicine

## 2022-04-17 NOTE — Telephone Encounter (Signed)
Follow up appointment 04/30/22- Rx RF approved Requested Prescriptions  Pending Prescriptions Disp Refills  . albuterol (VENTOLIN HFA) 108 (90 Base) MCG/ACT inhaler [Pharmacy Med Name: ALBUTEROL HFA (PROAIR) INHALER] 8.5 each 0    Sig: TAKE 2 PUFFS BY MOUTH EVERY 6 HOURS AS NEEDED FOR WHEEZE OR SHORTNESS OF BREATH     Pulmonology:  [facility] 2 Passed - 04/17/2022  1:46 AM      Passed - Last BP in normal range    BP Readings from Last 1 Encounters:  03/26/22 126/88         Passed - Last Heart Rate in normal range    Pulse Readings from Last 1 Encounters:  03/26/22 85         Passed - Valid encounter within last 12 months    Recent Outpatient Visits          3 weeks ago Community acquired pneumonia of right lower lobe of lung   [facility] [place], Salvadore Oxford, NP   1 year ago Encounter for general adult medical examination with abnormal findings   [facility] [place], Salvadore Oxford, NP   1 year ago Neck swelling   [facility], Jodelle Gross, FNP   2 years ago Bloating   [facility], Jodelle Gross, FNP   2 years ago Routine medical exam   [facility], Jodelle Gross, FNP      Future Appointments            In 1 week Baity, Salvadore Oxford, NP [facility], [facility]

## 2022-04-30 ENCOUNTER — Encounter: Payer: Self-pay | Admitting: Internal Medicine

## 2022-04-30 ENCOUNTER — Ambulatory Visit (INDEPENDENT_AMBULATORY_CARE_PROVIDER_SITE_OTHER): Payer: BC Managed Care – PPO | Admitting: Internal Medicine

## 2022-04-30 VITALS — BP 126/82 | HR 100 | Temp 97.1°F | Ht 66.0 in | Wt 224.0 lb

## 2022-04-30 DIAGNOSIS — E66811 Obesity, class 1: Secondary | ICD-10-CM | POA: Insufficient documentation

## 2022-04-30 DIAGNOSIS — Z0001 Encounter for general adult medical examination with abnormal findings: Secondary | ICD-10-CM | POA: Diagnosis not present

## 2022-04-30 DIAGNOSIS — Z6836 Body mass index (BMI) 36.0-36.9, adult: Secondary | ICD-10-CM | POA: Insufficient documentation

## 2022-04-30 DIAGNOSIS — E6609 Other obesity due to excess calories: Secondary | ICD-10-CM | POA: Insufficient documentation

## 2022-04-30 MED ORDER — FLUOXETINE HCL 20 MG PO CAPS: 20.0000 mg | ORAL_CAPSULE | Freq: Every day | ORAL | 1 refills | Status: DC

## 2022-04-30 NOTE — Assessment & Plan Note (Signed)
Encourage diet and exercise for weight loss 

## 2022-04-30 NOTE — Patient Instructions (Signed)

## 2022-04-30 NOTE — Progress Notes (Signed)
Subjective:    Patient ID: Stacey Huynh, female    DOB: April 03, 1986, 36 y.o.   MRN: [id number]  HPI  Pt presents to the clinic today for her annual exam.  Flu: 10/2018 Tetanus: 12/2012 Covid: never Pap smear: hysterectomy Dentist: biannually  Diet: She does eat meat. She consumes fruits and veggies. She does eat some fried foods. She drinks mostly soda and water. Exercise: None  Review of Systems     Past Medical History:  Diagnosis Date   Abnormal Pap smear    Abnormal Pap smear of cervix    Anxiety    Hypertension     Current Outpatient Medications  Medication Sig Dispense Refill   albuterol (VENTOLIN HFA) 108 (90 Base) MCG/ACT inhaler TAKE 2 PUFFS BY MOUTH EVERY 6 HOURS AS NEEDED FOR WHEEZE OR SHORTNESS OF BREATH 8.5 each 0   azithromycin (ZITHROMAX) 250 MG tablet Take 1 tablet (250 mg total) by mouth daily. Take first 2 tablets together, then 1 every day until finished. 6 tablet 0   chlorpheniramine-HYDROcodone (TUSSIONEX PENNKINETIC ER) 10-8 MG/5ML Take 5 mLs by mouth every 12 (twelve) hours as needed for cough. 115 mL 0   No current facility-administered medications for this visit.    Allergies  Allergen Reactions   Penicillins Other (See Comments)    Did it involve swelling of the face/tongue/throat, SOB, or low BP? Unknown Did it involve sudden or severe rash/hives, skin peeling, or any reaction on the inside of your mouth or nose? Unknown Did you need to seek medical attention at a hospital or doctor's office? Unknown When did it last happen? Childhood allergy If all above answers are "NO", may proceed with cephalosporin use.      Family History  Problem Relation Age of Onset   Pancreatic cancer Paternal Aunt    Bone cancer Maternal Grandmother    Lung cancer Maternal Grandmother    Colon cancer Paternal Grandfather    Depression Mother    Depression Father    Suicidality Father     Social History   Socioeconomic History   Marital status:  Married    Spouse name: Not on file   Number of children: Not on file   Years of education: Not on file   Highest education level: Not on file  Occupational History   Not on file  Tobacco Use   Smoking status: Never   Smokeless tobacco: Never  Vaping Use   Vaping Use: Never used  Substance and Sexual Activity   Alcohol use: Yes    Comment: rare   Drug use: No   Sexual activity: Yes    Birth control/protection: Surgical    Comment: Hysterectomy  Other Topics Concern   Not on file  Social History Narrative   Not on file   Social Determinants of Health   Financial Resource Strain: Not on file  Food Insecurity: Not on file  Transportation Needs: Not on file  Physical Activity: Not on file  Stress: Not on file  Social Connections: Not on file  Intimate Partner Violence: Not on file     Constitutional: Denies fever, malaise, fatigue, headache or abrupt weight changes.  HEENT: Denies eye pain, eye redness, ear pain, ringing in the ears, wax buildup, runny nose, nasal congestion, bloody nose, or sore throat. Respiratory: Denies difficulty breathing, shortness of breath, cough or sputum production.   Cardiovascular: Denies chest pain, chest tightness, palpitations or swelling in the hands or feet.  Gastrointestinal: Denies abdominal pain, bloating,  constipation, diarrhea or blood in the stool.  GU: Denies urgency, frequency, pain with urination, burning sensation, blood in urine, odor or discharge. Musculoskeletal: Denies decrease in range of motion, difficulty with gait, muscle pain or joint pain and swelling.  Skin: Denies redness, rashes, lesions or ulcercations.  Neurological: Denies dizziness, difficulty with memory, difficulty with speech or problems with balance and coordination.  Psych: Pt has a history of anxiety. Denies depression, SI/HI.  No other specific complaints in a complete review of systems (except as listed in HPI above).  Objective:   Physical Exam  BP  126/82 (BP Location: Left Arm, Patient Position: Sitting, Cuff Size: Large)   Pulse 100   Temp (!) 97.1 F (36.2 C) (Temporal)   Ht _0  (1.676 m)   Wt 224 lb (101.6 kg)   LMP 11/09/2019   SpO2 98%   BMI 36.15 kg/m   Wt Readings from Last 3 Encounters:  03/26/22 224 lb (101.6 kg)  02/20/21 225 lb 12.8 oz (102.4 kg)  09/29/20 217 lb (98.4 kg)    General: Appears her stated age, obese, in NAD. Skin: Warm, dry and intact.  Multiple skin tags noted of neck. HEENT: Head: normal shape and size; Eyes: sclera white, no icterus, conjunctiva pink, PERRLA and EOMs intact;  Neck:  Neck supple, trachea midline. No masses, lumps or thyromegaly present.  Cardiovascular: Normal rate and rhythm. S1,S2 noted.  No murmur, rubs or gallops noted. No JVD or BLE edema.  Pulmonary/Chest: Normal effort and positive vesicular breath sounds. No respiratory distress. No wheezes, rales or ronchi noted.  Abdomen: Normal bowel sound Musculoskeletal: Strength 5/5 BUE/BLE.  No difficulty with gait.  Neurological: Alert and oriented. Cranial nerves II-XII grossly intact. Coordination normal.  Psychiatric: Mood and affect normal. Behavior is normal. Judgment and thought content normal.   BMET    Component Value Date/Time   NA 141 02/20/2021 0829   NA 139 05/12/2017 0000   K 4.2 02/20/2021 0829   CL 106 02/20/2021 0829   CO2 27 02/20/2021 0829   GLUCOSE 85 02/20/2021 0829   BUN 11 02/20/2021 0829   BUN 9 05/12/2017 0000   CREATININE 0.78 02/20/2021 0829   CALCIUM 9.3 02/20/2021 0829   GFRNONAA >60 09/29/2020 2129   GFRNONAA 82 06/06/2020 1618   GFRAA 95 06/06/2020 1618    Lipid Panel     Component Value Date/Time   CHOL 187 02/20/2021 0829   CHOL 193 05/12/2017 0000   TRIG 142 02/20/2021 0829   HDL 48 (L) 02/20/2021 0829   HDL 40 05/12/2017 0000   CHOLHDL 3.9 02/20/2021 0829   LDLCALC 114 (H) 02/20/2021 0829    CBC    Component Value Date/Time   WBC 6.4 02/20/2021 0829   RBC 4.64  02/20/2021 0829   HGB 14.1 02/20/2021 0829   HGB 14.4 05/12/2017 0000   HCT 42.9 02/20/2021 0829   HCT 41.5 05/12/2017 0000   PLT 333 02/20/2021 0829   PLT 273 05/12/2017 0000   MCV 92.5 02/20/2021 0829   MCV 89 05/12/2017 0000   MCH 30.4 02/20/2021 0829   MCHC 32.9 02/20/2021 0829   RDW 12.2 02/20/2021 0829   RDW 13.3 05/12/2017 0000   LYMPHSABS 1,883 06/06/2020 1618   MONOABS 0.6 04/17/2019 1925   EOSABS 172 06/06/2020 1618   BASOSABS 52 06/06/2020 1618    Hgb A1C Lab Results  Component Value Date   HGBA1C 5.4 02/20/2021           Assessment &  Plan:   Preventative Health Maintenance:  Encouraged her to get a flu shot in the fall Tetanus UTD Encouraged her to get a COVID-vaccine She no longer needs Pap smears Encouraged her to consume a balanced diet and exercise regimen Advised her to see an eye doctor and dentist annually We will check CBC, c-Met, lipid and A1c today  RTC in 6 months, follow-up chronic conditions Webb Silversmith, NP

## 2022-05-01 ENCOUNTER — Encounter: Payer: Self-pay | Admitting: Internal Medicine

## 2022-05-01 DIAGNOSIS — E782 Mixed hyperlipidemia: Secondary | ICD-10-CM

## 2022-05-01 LAB — LIPID PANEL
Cholesterol: 188 mg/dL (ref <200)
HDL: 40 mg/dL — ABNORMAL LOW (ref >=50)
LDL Cholesterol (Calc): 116 mg/dL (calc) — ABNORMAL HIGH
Non-HDL Cholesterol (Calc): 148 mg/dL (calc) — ABNORMAL HIGH (ref <130)
Total CHOL/HDL Ratio: 4.7 (calc) (ref <5.0)
Triglycerides: 205 mg/dL — ABNORMAL HIGH (ref <150)

## 2022-05-01 LAB — COMPLETE METABOLIC PANEL WITH GFR
AG Ratio: 1.5 (calc) (ref 1.0–2.5)
ALT: 18 U/L (ref 6–29)
AST: 15 U/L (ref 10–30)
Albumin: 4.1 g/dL (ref 3.6–5.1)
Alkaline phosphatase (APISO): 59 U/L (ref 31–125)
BUN: 11 mg/dL (ref 7–25)
CO2: 21 mmol/L (ref 20–32)
Calcium: 9.2 mg/dL (ref 8.6–10.2)
Chloride: 107 mmol/L (ref 98–110)
Creat: 0.74 mg/dL (ref 0.50–0.97)
Globulin: 2.7 g/dL (calc) (ref 1.9–3.7)
Glucose, Bld: 124 mg/dL (ref 65–139)
Potassium: 3.9 mmol/L (ref 3.5–5.3)
Sodium: 138 mmol/L (ref 135–146)
Total Bilirubin: 0.5 mg/dL (ref 0.2–1.2)
Total Protein: 6.8 g/dL (ref 6.1–8.1)
eGFR: 107 mL/min/{1.73_m2} (ref >=60)

## 2022-05-01 LAB — CBC
HCT: 41.8 % (ref 35.0–45.0)
Hemoglobin: 13.9 g/dL (ref 11.7–15.5)
MCH: 30.5 pg (ref 27.0–33.0)
MCHC: 33.3 g/dL (ref 32.0–36.0)
MCV: 91.9 fL (ref 80.0–100.0)
MPV: 10.1 fL (ref 7.5–12.5)
Platelets: 317 10*3/uL (ref 140–400)
RBC: 4.55 10*6/uL (ref 3.80–5.10)
RDW: 12.3 % (ref 11.0–15.0)
WBC: 8.8 10*3/uL (ref 3.8–10.8)

## 2022-05-01 LAB — HEMOGLOBIN A1C
Hgb A1c MFr Bld: 5.4 % of total Hgb (ref <5.7)
Mean Plasma Glucose: 108 mg/dL
eAG (mmol/L): 6 mmol/L

## 2022-05-01 MED ORDER — LOVASTATIN 10 MG PO TABS: 10.0000 mg | ORAL_TABLET | Freq: Every day | ORAL | 1 refills | Status: DC

## 2022-11-07 ENCOUNTER — Ambulatory Visit
Admission: EM | Admit: 2022-11-07 | Discharge: 2022-11-07 | Disposition: A | Discharge status: Home / Self Care | Payer: BC Managed Care – PPO | Attending: Family Medicine | Admitting: Family Medicine

## 2022-11-07 DIAGNOSIS — B349 Viral infection, unspecified: Secondary | ICD-10-CM | POA: Insufficient documentation

## 2022-11-07 DIAGNOSIS — Z20822 Contact with and (suspected) exposure to covid-19: Secondary | ICD-10-CM

## 2022-11-07 DIAGNOSIS — R509 Fever, unspecified: Secondary | ICD-10-CM | POA: Insufficient documentation

## 2022-11-07 LAB — RESP PANEL BY RT-PCR (RSV, FLU A&B, COVID)  RVPGX2
Influenza A by PCR: NEGATIVE
Influenza B by PCR: NEGATIVE
Resp Syncytial Virus by PCR: NEGATIVE
SARS Coronavirus 2 by RT PCR: NEGATIVE

## 2022-11-07 LAB — GROUP A STREP BY PCR: Group A Strep by PCR: NOT DETECTED

## 2022-11-07 MED ORDER — ACETAMINOPHEN 325 MG PO TABS
650.0000 mg | ORAL_TABLET | Freq: Once | ORAL | Status: AC
  Administered 2022-11-07: 650 mg via ORAL

## 2022-11-07 NOTE — ED Provider Notes (Signed)
[facility]    CSN: [id number] Arrival date & time: 11/07/22  2836      History   Chief Complaint Chief Complaint  Patient presents with   Nausea    Nausea, diarrhea, congestion, body aches, small grade fever. - Entered by patient   Generalized Body Aches   Nasal Congestion   Diarrhea    HPI Stacey Huynh is a 37 y.o. female.   HPI   Stacey Huynh presents for nausea, diarrhea, chills, nasal congestion, fever for the past 2 days. The first part of the week her throat and ears were burnings. 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  No vomiting or rash. Has lower abdominal pain. No burning with urination, urgency or frequency, blood in urine or stool.     Past Medical History:  Diagnosis Date   Abnormal Pap smear    Abnormal Pap smear of cervix    Anxiety    Hypertension     Patient Active Problem List   Diagnosis Date Noted   Class 2 severe obesity due to excess calories with serious comorbidity and body mass index (BMI) of 36.0 to 36.9 in adult [facility]) 04/30/2022   Anxiety 02/19/2020    Past Surgical History:  Procedure Laterality Date   CYSTOSCOPY N/A 01/25/2020   Procedure: CYSTOSCOPY;  Surgeon: Malachy Mood, MD;  Location: [facility];  Service: Gynecology;  Laterality: N/A;   LAPAROSCOPIC OVARIAN CYSTECTOMY Right 04/13/2019   Procedure: LAPAROSCOPIC RIGHT OVARIAN CYSTECTOMY and removal of right paratubal cyst;  Surgeon: Malachy Mood, MD;  Location: [facility];  Service: Gynecology;  Laterality: Right;   LEEP     TOTAL LAPAROSCOPIC HYSTERECTOMY WITH SALPINGECTOMY N/A 01/25/2020   Procedure: TOTAL LAPAROSCOPIC HYSTERECTOMY WITH BILATERAL SALPINGECTOMY;  Surgeon: Malachy Mood, MD;  Location: [facility];  Service: Gynecology;  Laterality: N/A;   WISDOM TOOTH EXTRACTION      OB History     Gravida  1   Para  1   Term  1   Preterm      AB      Living  1      SAB      IAB      Ectopic      Multiple      Live Births   1            Home Medications    Prior to Admission medications   Medication Sig Start Date End Date Taking? Authorizing Provider  FLUoxetine (PROZAC) 20 MG capsule Take 1 capsule (20 mg total) by mouth daily. 04/30/22  Yes Baity, Coralie Keens, NP  lovastatin (MEVACOR) 10 MG tablet Take 1 tablet (10 mg total) by mouth at bedtime. 05/01/22  Yes 51, Coralie Keens, NP    Family History Family History  Problem Relation Age of Onset   Pancreatic cancer Paternal Aunt    Bone cancer Maternal Grandmother    Lung cancer Maternal Grandmother    Colon cancer Paternal Grandfather    Depression Mother    Depression Father    Suicidality Father     Social History Social History   Tobacco Use   Smoking status: Never   Smokeless tobacco: Never  Vaping Use   Vaping Use: Never used  Substance Use Topics   Alcohol use: Yes    Comment: rare   Drug use: No     Allergies   Penicillins   Review of Systems Review of Systems: negative unless otherwise stated in  HPI.      Physical Exam Triage Vital Signs ED Triage Vitals  Enc Vitals Group     BP 11/07/22 0832 (!) 138/91     Pulse Rate 11/07/22 0832 95     Resp 11/07/22 0832 16     Temp 11/07/22 0832 (!) 101 F (38.3 C)     Temp Source 11/07/22 0832 Oral     SpO2 11/07/22 0832 96 %     Weight 11/07/22 0831 220 lb (99.8 kg)     Height 11/07/22 0831 5\' 6"  (1.676 m)     Head Circumference --      Peak Flow --      Pain Score 11/07/22 0831 6     Pain Loc --      Pain Edu? --      Excl. in [place]? --    No data found.  Updated Vital Signs BP (!) 138/91 (BP Location: Left Arm)   Pulse 95   Temp (!) 101 F (38.3 C) (Oral)   Resp 16   Ht 5\' 6"  (1.676 m)   Wt 99.8 kg   LMP 11/09/2019   SpO2 96%   BMI 35.51 kg/m   Visual Acuity Right Eye Distance:   Left Eye Distance:   Bilateral Distance:    Right Eye Near:   Left Eye Near:    Bilateral Near:     Physical Exam GEN:     alert, non-toxic appearing female in no distress     HENT:  mucus membranes moist, oropharyngeal without lesions or exudate, no tonsillar hypertrophy, mild oropharyngeal erythema, clear nasal discharge, bilateral TM normal EYES:   pupils equal and reactive, no scleral injection or discharge NECK:  normal ROM, no meningismus   RESP:  no increased work of breathing, clear to auscultation bilaterally CVS:   regular rate and rhythm ABD: Soft, nontender in all quadrants, active bowel sounds, no rebound, no guarding, negative Murphy's, negative McBurney's Skin:   warm and dry, no rash on visible skin    UC Treatments / Results  Labs (all labs ordered are listed, but only abnormal results are displayed) Labs Reviewed  RESP PANEL BY RT-PCR (RSV, FLU A&B, COVID)  [id number]  GROUP A STREP BY PCR    EKG   Radiology No results found.  Procedures Procedures (including critical care time)  Medications Ordered in UC Medications  acetaminophen (TYLENOL) tablet 650 mg (650 mg Oral Given 11/07/22 11/11/2019)    Initial Impression / Assessment and Plan / UC Course  I have reviewed the triage vital signs and the nursing notes.  Pertinent labs & imaging results that were available during my care of the patient were reviewed by me and considered in my medical decision making (see chart for details).       Pt is a 37 y.o. female who presents for 1-2 days of respiratory symptoms. [name] is febrile (101 F) here given Tylenol. Satting well on room air. Overall pt is non-toxic appearing, well hydrated, without respiratory distress. Pulmonary and abdominal exams are unremarkable.  COVID, RSV and influenza testing obtained and were negative.  She has no urinary complaints. UA deferred.  Strep PCR was negative.   Pt to quarantine until COVID test results or longer if positive.  I will call patient with test results, if positive. History consistent with viral  illness. Discussed symptomatic treatment.  Explained lack of efficacy of antibiotics in viral disease.   Typical duration of symptoms discussed. Work note provided.  Return and ED precautions given and voiced understanding. Discussed MDM, treatment plan and plan for follow-up with patient who agrees with plan.     Final Clinical Impressions(s) / UC Diagnoses   Final diagnoses:  Viral illness     Discharge Instructions      Your COVID, flu and RSV test are negative.  Your strep test was also negative. You can take Tylenol and/or Ibuprofen as needed for fever reduction and pain relief.    For sore throat: try warm salt water gargles, Mucinex sore throat cough drops or cepacol lozenges, throat spray, warm tea or water with lemon/honey, popsicles or ice, or OTC cold relief medicine for throat discomfort. You can also purchase chloraseptic spray at the pharmacy or dollar store.   For congestion: take a daily anti-histamine like Zyrtec, Claritin, and a oral decongestant, such as pseudoephedrine.  You can also use Flonase 1-2 sprays in each nostril daily. Afrin is also a good option, if you do not have high blood pressure.    It is important to stay hydrated: drink plenty of fluids (water, gatorade/powerade/pedialyte, juices, or teas) to keep your throat moisturized and help further relieve irritation/discomfort.    Return or go to the Emergency Department if symptoms worsen or do not improve in the next few days      ED Prescriptions   None    PDMP not reviewed this encounter.   Lyndee Hensen, DO 11/07/22 1044

## 2022-11-07 NOTE — ED Triage Notes (Signed)
Pt c/o nausea,congestion,bodyaches,chills,fevers & diarrhea x2 days.

## 2022-11-07 NOTE — Discharge Instructions (Addendum)
Your COVID, flu and RSV test are negative.  Your strep test was also negative. You can take Tylenol and/or Ibuprofen as needed for fever reduction and pain relief.    For sore throat: try warm salt water gargles, Mucinex sore throat cough drops or cepacol lozenges, throat spray, warm tea or water with lemon/honey, popsicles or ice, or OTC cold relief medicine for throat discomfort. You can also purchase chloraseptic spray at the pharmacy or dollar store.   For congestion: take a daily anti-histamine like Zyrtec, Claritin, and a oral decongestant, such as pseudoephedrine.  You can also use Flonase 1-2 sprays in each nostril daily. Afrin is also a good option, if you do not have high blood pressure.    It is important to stay hydrated: drink plenty of fluids (water, gatorade/powerade/pedialyte, juices, or teas) to keep your throat moisturized and help further relieve irritation/discomfort.    Return or go to the Emergency Department if symptoms worsen or do not improve in the next few days

## 2022-11-24 ENCOUNTER — Ambulatory Visit
Admission: EM | Admit: 2022-11-24 | Discharge: 2022-11-24 | Disposition: A | Discharge status: Home / Self Care | Payer: BC Managed Care – PPO | Attending: Physician Assistant | Admitting: Physician Assistant

## 2022-11-24 ENCOUNTER — Encounter: Payer: Self-pay | Admitting: Emergency Medicine

## 2022-11-24 ENCOUNTER — Ambulatory Visit: Payer: BC Managed Care – PPO | Admitting: Internal Medicine

## 2022-11-24 DIAGNOSIS — J069 Acute upper respiratory infection, unspecified: Secondary | ICD-10-CM | POA: Insufficient documentation

## 2022-11-24 DIAGNOSIS — Z1152 Encounter for screening for COVID-19: Secondary | ICD-10-CM | POA: Insufficient documentation

## 2022-11-24 LAB — SARS CORONAVIRUS 2 BY RT PCR: SARS Coronavirus 2 by RT PCR: NEGATIVE

## 2022-11-24 MED ORDER — PROMETHAZINE-DM 6.25-15 MG/5ML PO SYRP: 5.0000 mL | ORAL_SOLUTION | Freq: Four times a day (QID) | ORAL | 0 refills | Status: DC | PRN

## 2022-11-24 MED ORDER — BENZONATATE 100 MG PO CAPS: 100.0000 mg | ORAL_CAPSULE | Freq: Three times a day (TID) | ORAL | 0 refills | Status: DC

## 2022-11-24 NOTE — ED Provider Notes (Signed)
[facility]    CSN: [id number] Arrival date & time: 11/24/22  1157      History   Chief Complaint Chief Complaint  Patient presents with   Cough    HPI Stacey Huynh is a 37 y.o. female.   Patient presents for evaluation of fever, chills, body aches, nasal congestion, cough present for 3 days.  Fever peaking at 100.  Cough is productive with green-yellow sputum.  Endorses that she had a recent viral illness with similar symptoms approximately 2 to 3 weeks ago.  Possible sick contacts that she works at a [employer].  Denies shortness of breath or wheezing.  Past Medical History:  Diagnosis Date   Abnormal Pap smear    Abnormal Pap smear of cervix    Anxiety    Hypertension     Patient Active Problem List   Diagnosis Date Noted   Class 2 severe obesity due to excess calories with serious comorbidity and body mass index (BMI) of 36.0 to 36.9 in adult [facility]) 04/30/2022   Anxiety 02/19/2020    Past Surgical History:  Procedure Laterality Date   CYSTOSCOPY N/A 01/25/2020   Procedure: CYSTOSCOPY;  Surgeon: Malachy Mood, MD;  Location: [facility];  Service: Gynecology;  Laterality: N/A;   LAPAROSCOPIC OVARIAN CYSTECTOMY Right 04/13/2019   Procedure: LAPAROSCOPIC RIGHT OVARIAN CYSTECTOMY and removal of right paratubal cyst;  Surgeon: Malachy Mood, MD;  Location: [facility];  Service: Gynecology;  Laterality: Right;   LEEP     TOTAL LAPAROSCOPIC HYSTERECTOMY WITH SALPINGECTOMY N/A 01/25/2020   Procedure: TOTAL LAPAROSCOPIC HYSTERECTOMY WITH BILATERAL SALPINGECTOMY;  Surgeon: Malachy Mood, MD;  Location: [facility];  Service: Gynecology;  Laterality: N/A;   WISDOM TOOTH EXTRACTION      OB History     Gravida  1   Para  1   Term  1   Preterm      AB      Living  1      SAB      IAB      Ectopic      Multiple      Live Births  1            Home Medications    Prior to Admission medications   Medication Sig Start Date End Date  Taking? Authorizing Provider  FLUoxetine (PROZAC) 20 MG capsule Take 1 capsule (20 mg total) by mouth daily. 04/30/22  Yes Baity, Coralie Keens, NP  lovastatin (MEVACOR) 10 MG tablet Take 1 tablet (10 mg total) by mouth at bedtime. 05/01/22  Yes 89, Coralie Keens, NP    Family History Family History  Problem Relation Age of Onset   Pancreatic cancer Paternal Aunt    Bone cancer Maternal Grandmother    Lung cancer Maternal Grandmother    Colon cancer Paternal Grandfather    Depression Mother    Depression Father    Suicidality Father     Social History Social History   Tobacco Use   Smoking status: Never   Smokeless tobacco: Never  Vaping Use   Vaping Use: Never used  Substance Use Topics   Alcohol use: Yes    Comment: rare   Drug use: No     Allergies   Penicillins   Review of Systems Review of Systems  Constitutional:  Positive for chills and fever. Negative for activity change, appetite change, diaphoresis, fatigue and unexpected weight change.  HENT:  Positive for congestion and rhinorrhea. Negative for dental problem, drooling,  ear discharge, ear pain, facial swelling, hearing loss, mouth sores, nosebleeds, postnasal drip, sinus pressure, sinus pain, sneezing, sore throat, tinnitus, trouble swallowing and voice change.   Respiratory:  Positive for cough. Negative for apnea, choking, chest tightness, shortness of breath, wheezing and stridor.   Cardiovascular: Negative.   Gastrointestinal: Negative.   Musculoskeletal:  Positive for myalgias. Negative for arthralgias, back pain, gait problem, joint swelling, neck pain and neck stiffness.  Skin: Negative.      Physical Exam Triage Vital Signs ED Triage Vitals  Enc Vitals Group     BP 11/24/22 1333 (!) 131/90     Pulse Rate 11/24/22 1333 100     Resp 11/24/22 1333 16     Temp 11/24/22 1333 98.8 F (37.1 C)     Temp Source 11/24/22 1333 Oral     SpO2 11/24/22 1333 97 %     Weight 11/24/22 1332 220 lb 0.3 oz (99.8 kg)      Height 11/24/22 1332 5' 6"$  (1.676 m)     Head Circumference --      Peak Flow --      Pain Score 11/24/22 1331 5     Pain Loc --      Pain Edu? --      Excl. in [place]? --    No data found.  Updated Vital Signs BP (!) 131/90 (BP Location: Right Arm)   Pulse 100   Temp 98.8 F (37.1 C) (Oral)   Resp 16   Ht 5' 6"$  (1.676 m)   Wt 220 lb 0.3 oz (99.8 kg)   LMP 11/09/2019   SpO2 97%   BMI 35.51 kg/m   Visual Acuity Right Eye Distance:   Left Eye Distance:   Bilateral Distance:    Right Eye Near:   Left Eye Near:    Bilateral Near:     Physical Exam Constitutional:      Appearance: Normal appearance.  HENT:     Head: Normocephalic.     Right Ear: Tympanic membrane, ear canal and external ear normal.     Left Ear: Tympanic membrane, ear canal and external ear normal.     Nose: Congestion and rhinorrhea present.     Mouth/Throat:     Mouth: Mucous membranes are moist.     Pharynx: No posterior oropharyngeal erythema.  Cardiovascular:     Rate and Rhythm: Normal rate and regular rhythm.     Pulses: Normal pulses.     Heart sounds: Normal heart sounds.  Pulmonary:     Effort: Pulmonary effort is normal.     Breath sounds: Normal breath sounds.  Skin:    General: Skin is warm and dry.  Neurological:     Mental Status: She is alert and oriented to person, place, and time. Mental status is at baseline.      UC Treatments / Results  Labs (all labs ordered are listed, but only abnormal results are displayed) Labs Reviewed  SARS CORONAVIRUS 2 BY RT PCR    EKG   Radiology No results found.  Procedures Procedures (including critical care time)  Medications Ordered in UC Medications - No data to display  Initial Impression / Assessment and Plan / UC Course  I have reviewed the triage vital signs and the nursing notes.  Pertinent labs & imaging results that were available during my care of the patient were reviewed by me and considered in my medical  decision making (see chart for details).  Viral URI with  cough  Patient is in no signs of distress nor toxic appearing.  Vital signs are stable.  Low suspicion for pneumonia, pneumothorax or bronchitis and therefore will defer imaging.  COVID testing negative.  Prescribed Promethazine DM and Tessalon.May use additional over-the-counter medications as needed for supportive care.  May follow-up with urgent care as needed if symptoms persist or worsen.  Note given.   Final Clinical Impressions(s) / UC Diagnoses   Final diagnoses:  None   Discharge Instructions   None    ED Prescriptions   None    PDMP not reviewed this encounter.   Hans Eden, NP 11/24/22 1436

## 2022-11-24 NOTE — ED Triage Notes (Signed)
Pt c/o cough, headache, nasal congestion, and hoarseness. Started about 3 days ago. She states she has had a low fever.

## 2022-11-24 NOTE — Discharge Instructions (Signed)
Your symptoms today are most likely being caused by a virus and should steadily improve in time it can take up to 7 to 10 days before you truly start to see a turnaround however things will get better  COVID test is negative  You may use Tessalon pill every 8 hours to help calm your coughing  You may use cough syrup every 6 hours as needed for additional comfort, be mindful this may make you feel sleepy    You can take Tylenol and/or Ibuprofen as needed for fever reduction and pain relief.   For cough: honey 1/2 to 1 teaspoon (you can dilute the honey in water or another fluid).  You can also use guaifenesin and dextromethorphan for cough. You can use a humidifier for chest congestion and cough.  If you don't have a humidifier, you can sit in the bathroom with the hot shower running.      For sore throat: try warm salt water gargles, cepacol lozenges, throat spray, warm tea or water with lemon/honey, popsicles or ice, or OTC cold relief medicine for throat discomfort.   For congestion: take a daily anti-histamine like Zyrtec, Claritin, and a oral decongestant, such as pseudoephedrine.  You can also use Flonase 1-2 sprays in each nostril daily.   It is important to stay hydrated: drink plenty of fluids (water, gatorade/powerade/pedialyte, juices, or teas) to keep your throat moisturized and help further relieve irritation/discomfort.

## 2022-12-11 ENCOUNTER — Encounter: Payer: Self-pay | Admitting: Internal Medicine

## 2022-12-11 ENCOUNTER — Ambulatory Visit: Payer: BC Managed Care – PPO | Admitting: Internal Medicine

## 2022-12-11 VITALS — BP 136/84 | HR 78 | Temp 96.8°F | Wt 222.0 lb

## 2022-12-11 DIAGNOSIS — F419 Anxiety disorder, unspecified: Secondary | ICD-10-CM | POA: Diagnosis not present

## 2022-12-11 DIAGNOSIS — I10 Essential (primary) hypertension: Secondary | ICD-10-CM | POA: Diagnosis not present

## 2022-12-11 DIAGNOSIS — R7309 Other abnormal glucose: Secondary | ICD-10-CM | POA: Diagnosis not present

## 2022-12-11 DIAGNOSIS — R6889 Other general symptoms and signs: Secondary | ICD-10-CM | POA: Diagnosis not present

## 2022-12-11 DIAGNOSIS — Z6835 Body mass index (BMI) 35.0-35.9, adult: Secondary | ICD-10-CM

## 2022-12-11 DIAGNOSIS — E782 Mixed hyperlipidemia: Secondary | ICD-10-CM | POA: Diagnosis not present

## 2022-12-11 DIAGNOSIS — D519 Vitamin B12 deficiency anemia, unspecified: Secondary | ICD-10-CM | POA: Diagnosis not present

## 2022-12-11 MED ORDER — FLUOXETINE HCL 20 MG PO CAPS: 20.0000 mg | ORAL_CAPSULE | Freq: Every day | ORAL | 1 refills | Status: DC

## 2022-12-11 NOTE — Progress Notes (Signed)
Subjective:    Patient ID: Stacey Huynh, female    DOB: [birth year], 37 y.o.   MRN: [id number]  HPI  Patient presents to clinic today for follow-up of chronic conditions.  Anxiety: Chronic, but she is not taking Fluoxetine as prescribed but she would like to get back on this.  She is not currently seeing a therapist.  She denies depression, SI/HI.  HLD: Her last LDL was 116, triglycerides 205, 04/2022.  She is not taking Lovastatin as prescribed.  She tries to consume low-fat diet.  Of note, her BP today is 148/82.  She reports she was on antihypertensive medication in the past but her blood pressure got too low so she was taken off of this.  ECG from 01/2020 reviewed.  She also reports she has frequently been getting sick lately.  She would like to have her thyroid and vitamins checked today.  Review of Systems     Past Medical History:  Diagnosis Date   Abnormal Pap smear    Abnormal Pap smear of cervix    Anxiety    Hypertension     Current Outpatient Medications  Medication Sig Dispense Refill   FLUoxetine (PROZAC) 20 MG capsule Take 1 capsule (20 mg total) by mouth daily. 90 capsule 1   lovastatin (MEVACOR) 10 MG tablet Take 1 tablet (10 mg total) by mouth at bedtime. 90 tablet 1   No current facility-administered medications for this visit.    Allergies  Allergen Reactions   Penicillins Other (See Comments)    Did it involve swelling of the face/tongue/throat, SOB, or low BP? Unknown Did it involve sudden or severe rash/hives, skin peeling, or any reaction on the inside of your mouth or nose? Unknown Did you need to seek medical attention at a hospital or doctor's office? Unknown When did it last happen? Childhood allergy If all above answers are "NO", may proceed with cephalosporin use.      Family History  Problem Relation Age of Onset   Pancreatic cancer Paternal Aunt    Bone cancer Maternal Grandmother    Lung cancer Maternal Grandmother    Colon  cancer Paternal Grandfather    Depression Mother    Depression Father    Suicidality Father     Social History   Socioeconomic History   Marital status: Married    Spouse name: Not on file   Number of children: Not on file   Years of education: Not on file   Highest education level: Not on file  Occupational History   Not on file  Tobacco Use   Smoking status: Never   Smokeless tobacco: Never  Vaping Use   Vaping Use: Never used  Substance and Sexual Activity   Alcohol use: Yes    Comment: rare   Drug use: No   Sexual activity: Yes    Birth control/protection: Surgical    Comment: Hysterectomy  Other Topics Concern   Not on file  Social History Narrative   Not on file   Social Determinants of Health   Financial Resource Strain: Not on file  Food Insecurity: Not on file  Transportation Needs: Not on file  Physical Activity: Not on file  Stress: Not on file  Social Connections: Not on file  Intimate Partner Violence: Not on file     Constitutional: Denies fever, malaise, fatigue, headache or abrupt weight changes.  HEENT: Denies eye pain, eye redness, ear pain, ringing in the ears, wax buildup, runny nose, nasal congestion,  bloody nose, or sore throat. Respiratory: Denies difficulty breathing, shortness of breath, cough or sputum production.   Cardiovascular: Denies chest pain, chest tightness, palpitations or swelling in the hands or feet.  Gastrointestinal: Denies abdominal pain, bloating, constipation, diarrhea or blood in the stool.  GU: Denies urgency, frequency, pain with urination, burning sensation, blood in urine, odor or discharge. Musculoskeletal: Denies decrease in range of motion, difficulty with gait, muscle pain or joint pain and swelling.  Skin: Denies redness, rashes, lesions or ulcercations.  Neurological: Denies dizziness, difficulty with memory, difficulty with speech or problems with balance and coordination.  Psych: Patient has a history of  anxiety.  Denies depression, SI/HI.  No other specific complaints in a complete review of systems (except as listed in HPI above).  Objective:   Physical Exam  BP 136/84 (BP Location: Left Arm, Patient Position: Sitting, Cuff Size: Large)   Pulse 78   Temp (!) 96.8 F (36 C) (Temporal)   Wt 222 lb (100.7 kg)   LMP 11/09/2019   SpO2 100%   BMI 35.83 kg/m   Wt Readings from Last 3 Encounters:  11/24/22 220 lb 0.3 oz (99.8 kg)  11/07/22 220 lb (99.8 kg)  04/30/22 224 lb (101.6 kg)    General: Appears her stated age, obese, in NAD. Skin: Warm, dry and intact.  HEENT: Head: normal shape and size; Eyes: sclera white, no icterus, conjunctiva pink, PERRLA and EOMs intact;  Cardiovascular: Normal rate and rhythm. S1,S2 noted.  No murmur, rubs or gallops noted. No JVD or BLE edema.  Pulmonary/Chest: Normal effort and positive vesicular breath sounds. No respiratory distress. No wheezes, rales or ronchi noted.  Musculoskeletal: . No difficulty with gait.  Neurological: Alert and oriented. Coordination normal.  Psychiatric: Mood and affect normal. Behavior is normal. Judgment and thought content normal.    BMET    Component Value Date/Time   NA 138 04/30/2022 1400   NA 139 05/12/2017 0000   K 3.9 04/30/2022 1400   CL 107 04/30/2022 1400   CO2 21 04/30/2022 1400   GLUCOSE 124 04/30/2022 1400   BUN 11 04/30/2022 1400   BUN 9 05/12/2017 0000   CREATININE 0.74 04/30/2022 1400   CALCIUM 9.2 04/30/2022 1400   GFRNONAA >60 09/29/2020 2129   GFRNONAA 82 06/06/2020 1618   GFRAA 95 06/06/2020 1618    Lipid Panel     Component Value Date/Time   CHOL 188 04/30/2022 1400   CHOL 193 05/12/2017 0000   TRIG 205 (H) 04/30/2022 1400   HDL 40 (L) 04/30/2022 1400   HDL 40 05/12/2017 0000   CHOLHDL 4.7 04/30/2022 1400   LDLCALC 116 (H) 04/30/2022 1400    CBC    Component Value Date/Time   WBC 8.8 04/30/2022 1400   RBC 4.55 04/30/2022 1400   HGB 13.9 04/30/2022 1400   HGB 14.4  05/12/2017 0000   HCT 41.8 04/30/2022 1400   HCT 41.5 05/12/2017 0000   PLT 317 04/30/2022 1400   PLT 273 05/12/2017 0000   MCV 91.9 04/30/2022 1400   MCV 89 05/12/2017 0000   MCH 30.5 04/30/2022 1400   MCHC 33.3 04/30/2022 1400   RDW 12.3 04/30/2022 1400   RDW 13.3 05/12/2017 0000   LYMPHSABS 1,883 06/06/2020 1618   MONOABS 0.6 04/17/2019 1925   EOSABS 172 06/06/2020 1618   BASOSABS 52 06/06/2020 1618    Hgb A1C Lab Results  Component Value Date   HGBA1C 5.4 04/30/2022  Assessment & Plan:  Frequently Sick:  Will check CBC, TSH, vitamin D and B12 today   RTC in 6 months for annual exam Webb Silversmith, NP

## 2022-12-11 NOTE — Assessment & Plan Note (Signed)
Encouraged diet and exercise for weight loss ?

## 2022-12-11 NOTE — Assessment & Plan Note (Signed)
C-Met and lipid profile today Encouraged her to consume low-fat diet We will let her know if she needs to restart lovastatin

## 2022-12-11 NOTE — Assessment & Plan Note (Signed)
Borderline controlled off meds Reinforced diet and exercise for weight loss

## 2022-12-11 NOTE — Patient Instructions (Signed)

## 2022-12-11 NOTE — Assessment & Plan Note (Signed)
Will restart fluoxetine Support offered

## 2022-12-12 LAB — COMPLETE METABOLIC PANEL WITH GFR
AG Ratio: 1.4 (calc) (ref 1.0–2.5)
ALT: 18 U/L (ref 6–29)
AST: 17 U/L (ref 10–30)
Albumin: 4.1 g/dL (ref 3.6–5.1)
Alkaline phosphatase (APISO): 63 U/L (ref 31–125)
BUN: 12 mg/dL (ref 7–25)
CO2: 25 mmol/L (ref 20–32)
Calcium: 9.3 mg/dL (ref 8.6–10.2)
Chloride: 107 mmol/L (ref 98–110)
Creat: 0.72 mg/dL (ref 0.50–0.97)
Globulin: 2.9 g/dL (calc) (ref 1.9–3.7)
Glucose, Bld: 101 mg/dL — ABNORMAL HIGH (ref 65–99)
Potassium: 4.1 mmol/L (ref 3.5–5.3)
Sodium: 140 mmol/L (ref 135–146)
Total Bilirubin: 0.5 mg/dL (ref 0.2–1.2)
Total Protein: 7 g/dL (ref 6.1–8.1)
eGFR: 110 mL/min/{1.73_m2} (ref >=60)

## 2022-12-12 LAB — LIPID PANEL
Cholesterol: 177 mg/dL (ref <200)
HDL: 47 mg/dL — ABNORMAL LOW (ref >=50)
LDL Cholesterol (Calc): 104 mg/dL (calc) — ABNORMAL HIGH
Non-HDL Cholesterol (Calc): 130 mg/dL (calc) — ABNORMAL HIGH (ref <130)
Total CHOL/HDL Ratio: 3.8 (calc) (ref <5.0)
Triglycerides: 146 mg/dL (ref <150)

## 2022-12-12 LAB — CBC
HCT: 40.1 % (ref 35.0–45.0)
Hemoglobin: 13.9 g/dL (ref 11.7–15.5)
MCH: 30.8 pg (ref 27.0–33.0)
MCHC: 34.7 g/dL (ref 32.0–36.0)
MCV: 88.7 fL (ref 80.0–100.0)
MPV: 10.7 fL (ref 7.5–12.5)
Platelets: 348 10*3/uL (ref 140–400)
RBC: 4.52 10*6/uL (ref 3.80–5.10)
RDW: 12.1 % (ref 11.0–15.0)
WBC: 9.7 10*3/uL (ref 3.8–10.8)

## 2022-12-12 LAB — TSH: TSH: 4.02 mIU/L

## 2022-12-12 LAB — VITAMIN B12: Vitamin B-12: 912 pg/mL (ref 200–1100)

## 2022-12-12 LAB — VITAMIN D 25 HYDROXY (VIT D DEFICIENCY, FRACTURES): Vit D, 25-Hydroxy: 23 ng/mL — ABNORMAL LOW (ref 30–100)

## 2022-12-12 LAB — HEMOGLOBIN A1C
Hgb A1c MFr Bld: 5.6 % of total Hgb (ref <5.7)
Mean Plasma Glucose: 114 mg/dL
eAG (mmol/L): 6.3 mmol/L

## 2022-12-14 ENCOUNTER — Encounter: Payer: Self-pay | Admitting: Internal Medicine

## 2023-01-25 ENCOUNTER — Emergency Department: Payer: BC Managed Care – PPO

## 2023-01-25 ENCOUNTER — Encounter: Payer: Self-pay | Admitting: Emergency Medicine

## 2023-01-25 ENCOUNTER — Observation Stay
Admission: EM | Admit: 2023-01-25 | Discharge: 2023-01-26 | Disposition: A | Discharge status: Home / Self Care | Payer: BC Managed Care – PPO | Attending: General Surgery | Admitting: General Surgery

## 2023-01-25 ENCOUNTER — Other Ambulatory Visit: Payer: Self-pay

## 2023-01-25 ENCOUNTER — Ambulatory Visit: Payer: Self-pay | Admitting: *Deleted

## 2023-01-25 DIAGNOSIS — R1013 Epigastric pain: Secondary | ICD-10-CM | POA: Diagnosis not present

## 2023-01-25 DIAGNOSIS — Z79899 Other long term (current) drug therapy: Secondary | ICD-10-CM | POA: Insufficient documentation

## 2023-01-25 DIAGNOSIS — I10 Essential (primary) hypertension: Secondary | ICD-10-CM | POA: Insufficient documentation

## 2023-01-25 DIAGNOSIS — K8012 Calculus of gallbladder with acute and chronic cholecystitis without obstruction: Principal | ICD-10-CM | POA: Insufficient documentation

## 2023-01-25 DIAGNOSIS — R109 Unspecified abdominal pain: Secondary | ICD-10-CM | POA: Diagnosis not present

## 2023-01-25 DIAGNOSIS — K810 Acute cholecystitis: Secondary | ICD-10-CM | POA: Diagnosis present

## 2023-01-25 LAB — COMPREHENSIVE METABOLIC PANEL
ALT: 25 U/L (ref 0–44)
AST: 22 U/L (ref 15–41)
Albumin: 4.1 g/dL (ref 3.5–5.0)
Alkaline Phosphatase: 61 U/L (ref 38–126)
Anion gap: 8 (ref 5–15)
BUN: 12 mg/dL (ref 6–20)
CO2: 24 mmol/L (ref 22–32)
Calcium: 9.4 mg/dL (ref 8.9–10.3)
Chloride: 104 mmol/L (ref 98–111)
Creatinine, Ser: 0.77 mg/dL (ref 0.44–1.00)
GFR, Estimated: >60 mL/min (ref >60)
Glucose, Bld: 135 mg/dL — ABNORMAL HIGH (ref 70–99)
Potassium: 3.9 mmol/L (ref 3.5–5.1)
Sodium: 136 mmol/L (ref 135–145)
Total Bilirubin: 0.7 mg/dL (ref 0.3–1.2)
Total Protein: 7.9 g/dL (ref 6.5–8.1)

## 2023-01-25 LAB — URINALYSIS, ROUTINE W REFLEX MICROSCOPIC
Bilirubin Urine: NEGATIVE
Glucose, UA: NEGATIVE mg/dL
Hgb urine dipstick: NEGATIVE
Ketones, ur: NEGATIVE mg/dL
Leukocytes,Ua: NEGATIVE
Nitrite: NEGATIVE
Protein, ur: NEGATIVE mg/dL
Specific Gravity, Urine: 1.015 (ref 1.005–1.030)
pH: 5 (ref 5.0–8.0)

## 2023-01-25 LAB — CBC
HCT: 43.4 % (ref 36.0–46.0)
Hemoglobin: 14.3 g/dL (ref 12.0–15.0)
MCH: 30.3 pg (ref 26.0–34.0)
MCHC: 32.9 g/dL (ref 30.0–36.0)
MCV: 91.9 fL (ref 80.0–100.0)
Platelets: 347 10*3/uL (ref 150–400)
RBC: 4.72 MIL/uL (ref 3.87–5.11)
RDW: 12.3 % (ref 11.5–15.5)
WBC: 10.6 10*3/uL — ABNORMAL HIGH (ref 4.0–10.5)
nRBC: 0 % (ref 0.0–0.2)

## 2023-01-25 LAB — LIPASE, BLOOD: Lipase: 38 U/L (ref 11–51)

## 2023-01-25 MED ORDER — ACETAMINOPHEN 650 MG RE SUPP: 650.0000 mg | Freq: Four times a day (QID) | RECTAL | Status: DC | PRN

## 2023-01-25 MED ORDER — ACETAMINOPHEN 325 MG PO TABS: 650.0000 mg | ORAL_TABLET | Freq: Four times a day (QID) | ORAL | Status: DC | PRN

## 2023-01-25 MED ORDER — FLUOXETINE HCL 20 MG PO CAPS: 20.0000 mg | ORAL_CAPSULE | Freq: Every day | ORAL | Status: DC

## 2023-01-25 MED ORDER — CIPROFLOXACIN IN D5W 400 MG/200ML IV SOLN
400.0000 mg | Freq: Two times a day (BID) | INTRAVENOUS | Status: DC
  Administered 2023-01-25 – 2023-01-26 (×2): 400 mg via INTRAVENOUS
  Filled 2023-01-25 (×2): qty 200

## 2023-01-25 MED ORDER — ONDANSETRON 4 MG PO TBDP: 4.0000 mg | ORAL_TABLET | Freq: Four times a day (QID) | ORAL | Status: DC | PRN

## 2023-01-25 MED ORDER — ENOXAPARIN SODIUM 40 MG/0.4ML IJ SOSY: 40.0000 mg | PREFILLED_SYRINGE | INTRAMUSCULAR | Status: DC

## 2023-01-25 MED ORDER — ONDANSETRON HCL 4 MG/2ML IJ SOLN
4.0000 mg | Freq: Four times a day (QID) | INTRAMUSCULAR | Status: DC | PRN
  Administered 2023-01-25: 4 mg via INTRAVENOUS
  Filled 2023-01-25: qty 2

## 2023-01-25 MED ORDER — PANTOPRAZOLE SODIUM 40 MG IV SOLR
40.0000 mg | Freq: Every day | INTRAVENOUS | Status: DC
  Administered 2023-01-25: 40 mg via INTRAVENOUS
  Filled 2023-01-25: qty 10

## 2023-01-25 MED ORDER — IOHEXOL 350 MG/ML SOLN
100.0000 mL | Freq: Once | INTRAVENOUS | Status: AC | PRN
  Administered 2023-01-25: 100 mL via INTRAVENOUS

## 2023-01-25 MED ORDER — MORPHINE SULFATE (PF) 4 MG/ML IV SOLN
4.0000 mg | INTRAVENOUS | Status: DC | PRN
  Administered 2023-01-25: 4 mg via INTRAVENOUS
  Filled 2023-01-25: qty 1

## 2023-01-25 MED ORDER — HYDROCODONE-ACETAMINOPHEN 5-325 MG PO TABS
1.0000 | ORAL_TABLET | ORAL | Status: DC | PRN
  Administered 2023-01-25 – 2023-01-26 (×2): 2 via ORAL
  Filled 2023-01-25 (×2): qty 2

## 2023-01-25 NOTE — ED Triage Notes (Signed)
Pt has right upper quad pin in abdomen for 5 hours.  Pt has n/v.  No diarrhea.  Pt alert.

## 2023-01-25 NOTE — Telephone Encounter (Signed)
  Chief Complaint: Abdominal Pain Symptoms: Upper right abdominal pain, radiates around to back. Constant, varies in intensity from 4-8/10. Nauseated, one episode of vomiting.Took antacids, ineffective. Frequency: Today Pertinent Negatives: Patient denies fever Disposition: [x] ED /[] Urgent Care (no appt availability in office) / [] Appointment(In office/virtual)/ []  [facility]/ [] Home Care/ [] Refused Recommended Disposition /[] [facility]/ []  Follow-up with PCP Additional Notes: Pt directed to ED. After hours call. Reason for Disposition  [1] SEVERE pain (e.g., excruciating) AND [2] present > 1 hour  Answer Assessment - Initial Assessment Questions 1. LOCATION: "Where does it hurt?"      Upper right 2. RADIATION: "Does the pain shoot anywhere else?" (e.g., chest, back)     Down into back 3. ONSET: "When did the pain begin?" (e.g., minutes, hours or days ago)      Today 4. SUDDEN: "Gradual or sudden onset?"     Sudden 5. PATTERN "Does the pain come and go, or is it constant?"    - If it comes and goes: "How long does it last?" "Do you have pain now?"     (Note: Comes and goes means the pain is intermittent. It goes away completely between bouts.)    - If constant: "Is it getting better, staying the same, or getting worse?"      (Note: Constant means the pain never goes away completely; most serious pain is constant and gets worse.)      Varies in intensity 6. SEVERITY: "How bad is the pain?"  (e.g., Scale 1-10; mild, moderate, or severe)    - MILD (1-3): Doesn't interfere with normal activities, abdomen soft and not tender to touch.     - MODERATE (4-7): Interferes with normal activities or awakens from sleep, abdomen tender to touch.     - SEVERE (8-10): Excruciating pain, doubled over, unable to do any normal activities.       8/10 at times 7. RECURRENT SYMPTOM: "Have you ever had this type of stomach pain before?" If Yes, ask: "When was the last time?" and "What  happened that time?"     Heartburn 8. CAUSE: "What do you think is causing the stomach pain?"     unsure 9. RELIEVING/AGGRAVATING FACTORS: "What makes it better or worse?" (e.g., antacids, bending or twisting motion, bowel movement)     Tums, no help 10. OTHER SYMPTOMS: "Do you have any other symptoms?" (e.g., back pain, diarrhea, fever, urination pain, vomiting)       Nausea, vomiting x 1 episode  Protocols used: Abdominal Pain - Female-A-AH

## 2023-01-25 NOTE — ED Provider Notes (Signed)
[facility] Provider Note  Patient Contact: 7:22 PM (approximate)   History   Abdominal Pain   HPI  Stacey Huynh is a 37 y.o. female with a history of hypertension and anxiety, presents to the emergency department with epigastric abdominal pain for the past 3 hours with nausea and vomiting but no diarrhea.  No fever or chills at home.  Patient denies experiencing similar symptoms in the past.  No chest pain, chest tightness or shortness of breath.  No associated rhinorrhea, nasal congestion or nonproductive cough.      Physical Exam   Triage Vital Signs: ED Triage Vitals  Enc Vitals Group     BP 01/25/23 1826 (!) 163/91     Pulse Rate 01/25/23 1826 (!) 56     Resp 01/25/23 1826 18     Temp 01/25/23 1826 98.6 F (37 C)     Temp Source 01/25/23 1826 Oral     SpO2 01/25/23 1826 98 %     Weight 01/25/23 1825 220 lb (99.8 kg)     Height 01/25/23 1825  (1.676 m)     Head Circumference --      Peak Flow --      Pain Score 01/25/23 1824 9     Pain Loc --      Pain Edu? --      Excl. in GC? --     Most recent vital signs: Vitals:   01/25/23 1826  BP: (!) 163/91  Pulse: (!) 56  Resp: 18  Temp: 98.6 F (37 C)  SpO2: 98%     General: Alert and in no acute distress. Eyes:  PERRL. EOMI. Head: No acute traumatic findings ENT:      Nose: No congestion/rhinnorhea.      Mouth/Throat: Mucous membranes are moist. Neck: No stridor. No cervical spine tenderness to palpation. Cardiovascular:  Good peripheral perfusion Respiratory: Normal respiratory effort without tachypnea or retractions. Lungs CTAB. Good air entry to the bases with no decreased or absent breath sounds. Gastrointestinal: Bowel sounds 4 quadrants. Soft and nontender to palpation. No guarding or rigidity. No palpable masses. No distention. No CVA tenderness. Musculoskeletal: Full range of motion to all extremities.  Neurologic:  No gross focal neurologic deficits are  appreciated.  Skin:   No rash noted    ED Results / Procedures / Treatments   Labs (all labs ordered are listed, but only abnormal results are displayed) Labs Reviewed  COMPREHENSIVE METABOLIC PANEL - Abnormal; Notable for the following components:      Result Value   Glucose, Bld 135 (*)    All other components within normal limits  CBC - Abnormal; Notable for the following components:   WBC 10.6 (*)    All other components within normal limits  URINALYSIS, ROUTINE W REFLEX MICROSCOPIC - Abnormal; Notable for the following components:   Color, Urine YELLOW (*)    APPearance CLEAR (*)    All other components within normal limits  LIPASE, BLOOD  HIV ANTIBODY (ROUTINE TESTING W REFLEX)        RADIOLOGY  I personally viewed and evaluated these images as part of my medical decision making, as well as reviewing the written report by the radiologist.  ED Provider Interpretation: CT abdomen and pelvis concerning for cholelithiasis without evidence of cholecystitis   PROCEDURES:  Critical Care performed: No  Procedures   MEDICATIONS ORDERED IN ED: Medications  FLUoxetine (PROZAC) capsule 20 mg (has no administration in time range)  enoxaparin (LOVENOX) injection 40 mg (has no administration in time range)  ciprofloxacin (CIPRO) IVPB 400 mg (400 mg Intravenous New Bag/Given 01/25/23 2055)  acetaminophen (TYLENOL) tablet 650 mg (has no administration in time range)    Or  acetaminophen (TYLENOL) suppository 650 mg (has no administration in time range)  HYDROcodone-acetaminophen (NORCO/VICODIN) 5-325 MG per tablet 1-2 tablet (has no administration in time range)  morphine (PF) 4 MG/ML injection 4 mg (4 mg Intravenous Given 01/25/23 2100)  ondansetron (ZOFRAN-ODT) disintegrating tablet 4 mg ( Oral See Alternative 01/25/23 2100)    Or  ondansetron (ZOFRAN) injection 4 mg (4 mg Intravenous Given 01/25/23 2100)  pantoprazole (PROTONIX) injection 40 mg (has no administration in  time range)  iohexol (OMNIPAQUE) 350 MG/ML injection 100 mL (100 mLs Intravenous Contrast Given 01/25/23 2000)     IMPRESSION / MDM / ASSESSMENT AND PLAN / ED COURSE  I reviewed the triage vital signs and the nursing notes.                              Assessment and plan: Abdominal pain:  37 year old female presents to the emergency department with 3 hours of intense nausea and vomiting before presenting to the emergency department.  Patient was hypertensive and bradycardic at triage but vital signs were otherwise reassuring.  She had epigastric and right upper quadrant tenderness to palpation without guarding.  CBC, CMP and lipase were within range.  No signs of UTI on urinalysis.  CT abdomen pelvis was concerning for cholelithiasis without cholecystitis.  I did review patient case with general surgeon on-call, Dr. Maia Plan.  Given patient's pain while in the emergency department, Dr. Maia Plan will admit for cholecystectomy in the a.m.  Patient made n.p.o. after midnight.   FINAL CLINICAL IMPRESSION(S) / ED DIAGNOSES   Final diagnoses:  Epigastric pain     Rx / DC Orders   ED Discharge Orders     None        Note:  This document was prepared using Dragon voice recognition software and may include unintentional dictation errors.   Pia Mau Winton, Cordelia Poche 01/25/23 2113    Merwyn Katos, MD 01/26/23 Moses Manners

## 2023-01-26 ENCOUNTER — Other Ambulatory Visit: Payer: Self-pay

## 2023-01-26 ENCOUNTER — Observation Stay: Payer: BC Managed Care – PPO | Admitting: Anesthesiology

## 2023-01-26 ENCOUNTER — Encounter: Payer: Self-pay | Admitting: General Surgery

## 2023-01-26 ENCOUNTER — Encounter
Admission: EM | Disposition: A | Discharge status: Home / Self Care | Payer: Self-pay | Source: Home / Self Care | Attending: Emergency Medicine

## 2023-01-26 DIAGNOSIS — K8010 Calculus of gallbladder with chronic cholecystitis without obstruction: Secondary | ICD-10-CM | POA: Diagnosis not present

## 2023-01-26 DIAGNOSIS — K8012 Calculus of gallbladder with acute and chronic cholecystitis without obstruction: Secondary | ICD-10-CM | POA: Diagnosis not present

## 2023-01-26 DIAGNOSIS — K810 Acute cholecystitis: Secondary | ICD-10-CM | POA: Diagnosis not present

## 2023-01-26 LAB — SURGICAL PCR SCREEN
MRSA, PCR: NEGATIVE
Staphylococcus aureus: POSITIVE — AB

## 2023-01-26 LAB — HIV ANTIBODY (ROUTINE TESTING W REFLEX): HIV Screen 4th Generation wRfx: NONREACTIVE

## 2023-01-26 SURGERY — CHOLECYSTECTOMY, ROBOT-ASSISTED, LAPAROSCOPIC
Anesthesia: General | Site: Abdomen

## 2023-01-26 MED ORDER — BUPIVACAINE HCL (PF) 0.25 % IJ SOLN
INTRAMUSCULAR | Status: AC
  Filled 2023-01-26: qty 30

## 2023-01-26 MED ORDER — OXYCODONE HCL 5 MG PO TABS
ORAL_TABLET | ORAL | Status: AC
  Filled 2023-01-26: qty 1

## 2023-01-26 MED ORDER — LIDOCAINE HCL (PF) 2 % IJ SOLN
INTRAMUSCULAR | Status: AC
  Filled 2023-01-26: qty 5

## 2023-01-26 MED ORDER — 0.9 % SODIUM CHLORIDE (POUR BTL) OPTIME
TOPICAL | Status: DC | PRN
  Administered 2023-01-26: 500 mL

## 2023-01-26 MED ORDER — BUPIVACAINE-EPINEPHRINE 0.25% -1:200000 IJ SOLN
INTRAMUSCULAR | Status: DC | PRN
  Administered 2023-01-26: 30 mL

## 2023-01-26 MED ORDER — ROCURONIUM BROMIDE 100 MG/10ML IV SOLN
INTRAVENOUS | Status: DC | PRN
  Administered 2023-01-26: 60 mg via INTRAVENOUS

## 2023-01-26 MED ORDER — DEXAMETHASONE SODIUM PHOSPHATE 10 MG/ML IJ SOLN
INTRAMUSCULAR | Status: AC
  Filled 2023-01-26: qty 1

## 2023-01-26 MED ORDER — OXYCODONE HCL 5 MG/5ML PO SOLN: 5.0000 mg | Freq: Once | ORAL | Status: AC | PRN

## 2023-01-26 MED ORDER — FENTANYL CITRATE (PF) 100 MCG/2ML IJ SOLN
INTRAMUSCULAR | Status: AC
  Filled 2023-01-26: qty 2

## 2023-01-26 MED ORDER — MIDAZOLAM HCL 2 MG/2ML IJ SOLN
INTRAMUSCULAR | Status: DC | PRN
  Administered 2023-01-26: 2 mg via INTRAVENOUS

## 2023-01-26 MED ORDER — MUPIROCIN 2 % EX OINT
1.0000 | TOPICAL_OINTMENT | Freq: Two times a day (BID) | CUTANEOUS | Status: DC
  Filled 2023-01-26: qty 22

## 2023-01-26 MED ORDER — DEXAMETHASONE SODIUM PHOSPHATE 10 MG/ML IJ SOLN
INTRAMUSCULAR | Status: DC | PRN
  Administered 2023-01-26: 5 mg via INTRAVENOUS

## 2023-01-26 MED ORDER — ONDANSETRON HCL 4 MG/2ML IJ SOLN
INTRAMUSCULAR | Status: AC
  Filled 2023-01-26: qty 2

## 2023-01-26 MED ORDER — ONDANSETRON HCL 4 MG/2ML IJ SOLN
INTRAMUSCULAR | Status: DC | PRN
  Administered 2023-01-26: 4 mg via INTRAVENOUS

## 2023-01-26 MED ORDER — LACTATED RINGERS IV SOLN: INTRAVENOUS | Status: DC | PRN

## 2023-01-26 MED ORDER — SUCCINYLCHOLINE CHLORIDE 200 MG/10ML IV SOSY
PREFILLED_SYRINGE | INTRAVENOUS | Status: DC | PRN
  Administered 2023-01-26: 80 mg via INTRAVENOUS

## 2023-01-26 MED ORDER — FENTANYL CITRATE (PF) 100 MCG/2ML IJ SOLN
INTRAMUSCULAR | Status: DC | PRN
  Administered 2023-01-26: 100 ug via INTRAVENOUS

## 2023-01-26 MED ORDER — LIDOCAINE HCL (CARDIAC) PF 100 MG/5ML IV SOSY
PREFILLED_SYRINGE | INTRAVENOUS | Status: DC | PRN
  Administered 2023-01-26: 80 mg via INTRAVENOUS

## 2023-01-26 MED ORDER — HYDROMORPHONE HCL 1 MG/ML IJ SOLN
INTRAMUSCULAR | Status: AC
  Filled 2023-01-26: qty 1

## 2023-01-26 MED ORDER — PROPOFOL 10 MG/ML IV BOLUS
INTRAVENOUS | Status: AC
  Filled 2023-01-26: qty 20

## 2023-01-26 MED ORDER — MIDAZOLAM HCL 2 MG/2ML IJ SOLN
INTRAMUSCULAR | Status: AC
  Filled 2023-01-26: qty 2

## 2023-01-26 MED ORDER — ROCURONIUM BROMIDE 10 MG/ML (PF) SYRINGE
PREFILLED_SYRINGE | INTRAVENOUS | Status: AC
  Filled 2023-01-26: qty 10

## 2023-01-26 MED ORDER — SUGAMMADEX SODIUM 200 MG/2ML IV SOLN
INTRAVENOUS | Status: DC | PRN
  Administered 2023-01-26: 200 mg via INTRAVENOUS

## 2023-01-26 MED ORDER — OXYCODONE-ACETAMINOPHEN 5-325 MG PO TABS: 1.0000 | ORAL_TABLET | ORAL | 0 refills | Status: DC | PRN

## 2023-01-26 MED ORDER — FENTANYL CITRATE (PF) 100 MCG/2ML IJ SOLN
25.0000 ug | INTRAMUSCULAR | Status: DC | PRN
  Administered 2023-01-26 (×4): 25 ug via INTRAVENOUS

## 2023-01-26 MED ORDER — HYDROMORPHONE HCL 1 MG/ML IJ SOLN
INTRAMUSCULAR | Status: DC | PRN
  Administered 2023-01-26 (×2): .5 mg via INTRAVENOUS

## 2023-01-26 MED ORDER — DROPERIDOL 2.5 MG/ML IJ SOLN
0.6250 mg | Freq: Once | INTRAMUSCULAR | Status: AC
  Administered 2023-01-26: 0.625 mg via INTRAVENOUS

## 2023-01-26 MED ORDER — KETOROLAC TROMETHAMINE 30 MG/ML IJ SOLN
INTRAMUSCULAR | Status: DC | PRN
  Administered 2023-01-26: 30 mg via INTRAVENOUS

## 2023-01-26 MED ORDER — PROPOFOL 10 MG/ML IV BOLUS
INTRAVENOUS | Status: DC | PRN
  Administered 2023-01-26: 200 mg via INTRAVENOUS

## 2023-01-26 MED ORDER — INDOCYANINE GREEN 25 MG IV SOLR
1.2500 mg | Freq: Once | INTRAVENOUS | Status: AC
  Administered 2023-01-26: 1.25 mg via INTRAVENOUS
  Filled 2023-01-26: qty 0.5

## 2023-01-26 MED ORDER — KETOROLAC TROMETHAMINE 30 MG/ML IJ SOLN
INTRAMUSCULAR | Status: AC
  Filled 2023-01-26: qty 1

## 2023-01-26 MED ORDER — EPINEPHRINE PF 1 MG/ML IJ SOLN
INTRAMUSCULAR | Status: AC
  Filled 2023-01-26: qty 1

## 2023-01-26 MED ORDER — DROPERIDOL 2.5 MG/ML IJ SOLN
INTRAMUSCULAR | Status: AC
  Filled 2023-01-26: qty 2

## 2023-01-26 MED ORDER — OXYCODONE HCL 5 MG PO TABS
5.0000 mg | ORAL_TABLET | Freq: Once | ORAL | Status: AC | PRN
  Administered 2023-01-26: 5 mg via ORAL

## 2023-01-26 SURGICAL SUPPLY — 51 items
BAG PRESSURE INF REUSE 1000 (BAG) IMPLANT
BLADE SURG SZ11 CARB STEEL (BLADE) ×1 IMPLANT
CANNULA REDUCER 12-8 DVNC XI (CANNULA) ×1 IMPLANT
CATH REDDICK CHOLANGI 4FR 50CM (CATHETERS) IMPLANT
CAUTERY HOOK MNPLR 1.6 DVNC XI (INSTRUMENTS) ×1 IMPLANT
CLIP LIGATING HEM O LOK PURPLE (MISCELLANEOUS) IMPLANT
CLIP LIGATING HEMO O LOK GREEN (MISCELLANEOUS) ×1 IMPLANT
DERMABOND ADVANCED .7 DNX12 (GAUZE/BANDAGES/DRESSINGS) ×1 IMPLANT
DRAPE ARM DVNC X/XI (DISPOSABLE) ×4 IMPLANT
DRAPE C-ARM XRAY 36X54 (DRAPES) IMPLANT
DRAPE COLUMN DVNC XI (DISPOSABLE) ×1 IMPLANT
ELECT REM PT RETURN 9FT ADLT (ELECTROSURGICAL) ×1
ELECTRODE REM PT RTRN 9FT ADLT (ELECTROSURGICAL) ×1 IMPLANT
FORCEPS BPLR 8 MD DVNC XI (FORCEP) ×1 IMPLANT
FORCEPS BPLR R/ABLATION 8 DVNC (INSTRUMENTS) ×1 IMPLANT
FORCEPS PROGRASP DVNC XI (FORCEP) ×1 IMPLANT
GLOVE BIO SURGEON STRL SZ 6.5 (GLOVE) ×2 IMPLANT
GLOVE BIOGEL PI IND STRL 6.5 (GLOVE) ×2 IMPLANT
GOWN STRL REUS W/ TWL LRG LVL3 (GOWN DISPOSABLE) ×3 IMPLANT
GOWN STRL REUS W/TWL LRG LVL3 (GOWN DISPOSABLE) ×3
GRASPER SUT TROCAR 14GX15 (MISCELLANEOUS) ×1 IMPLANT
IRRIGATOR SUCT 8 DISP DVNC XI (IRRIGATION / IRRIGATOR) IMPLANT
IV CATH ANGIO 12GX3 LT BLUE (NEEDLE) IMPLANT
IV NS 1000ML (IV SOLUTION)
IV NS 1000ML BAXH (IV SOLUTION) IMPLANT
KIT PINK PAD W/HEAD ARE REST (MISCELLANEOUS) ×1 IMPLANT
KIT PINK PAD W/HEAD ARM REST (MISCELLANEOUS) ×1 IMPLANT
LABEL OR SOLS (LABEL) ×1 IMPLANT
MANIFOLD NEPTUNE II (INSTRUMENTS) ×1 IMPLANT
NDL HYPO 22X1.5 SAFETY MO (MISCELLANEOUS) ×1 IMPLANT
NDL INSUFFLATION 14GA 120MM (NEEDLE) ×1 IMPLANT
NEEDLE HYPO 22X1.5 SAFETY MO (MISCELLANEOUS) ×1 IMPLANT
NEEDLE INSUFFLATION 14GA 120MM (NEEDLE) ×1 IMPLANT
NS IRRIG 500ML POUR BTL (IV SOLUTION) ×1 IMPLANT
OBTURATOR OPTICAL STND 8 DVNC (TROCAR) ×1
OBTURATOR OPTICALSTD 8 DVNC (TROCAR) ×1 IMPLANT
PACK LAP CHOLECYSTECTOMY (MISCELLANEOUS) ×1 IMPLANT
SEAL UNIV 5-12 XI (MISCELLANEOUS) ×4 IMPLANT
SET TUBE SMOKE EVAC HIGH FLOW (TUBING) ×1 IMPLANT
SOL ELECTROSURG ANTI STICK (MISCELLANEOUS) ×1
SOLUTION ELECTROSURG ANTI STCK (MISCELLANEOUS) ×1 IMPLANT
SPIKE FLUID TRANSFER (MISCELLANEOUS) ×2 IMPLANT
SPONGE T-LAP 4X18 ~~LOC~~+RFID (SPONGE) IMPLANT
SUT MNCRL 4-0 (SUTURE) ×2
SUT MNCRL 4-0 27XMFL (SUTURE) ×2
SUT VICRYL 0 UR6 27IN ABS (SUTURE) ×1 IMPLANT
SUTURE MNCRL 4-0 27XMF (SUTURE) ×1 IMPLANT
SYS BAG RETRIEVAL 10MM (BASKET) ×1
SYSTEM BAG RETRIEVAL 10MM (BASKET) ×1 IMPLANT
TRAP FLUID SMOKE EVACUATOR (MISCELLANEOUS) ×1 IMPLANT
WATER STERILE IRR 500ML POUR (IV SOLUTION) ×1 IMPLANT

## 2023-01-26 NOTE — H&P (Signed)
SURGICAL CONSULTATION NOTE   HISTORY OF PRESENT ILLNESS (HPI):  37 y.o. female presented to [facility] ED for evaluation of abdominal pain. Patient reports she started having abdominal pain yesterday.  The pain lasted about 5 hours without any improvement.  She decided to come to the ED for further evaluation.  The pain was localized to the right upper quadrant.  The pain for the ache to her back.  She cannot identify any alleviating or aggravating factors.  She denies any fever or chills.  At the ED she was found with tenderness on palpation in the right upper quadrant.  CT scan of the abdomen and pelvis shows cholelithiasis without significant fat stranding.  White blood cell within normal limits.  Normal liver enzymes and bilirubin.  I personally evaluated the labs and the images.  She received multiple pain medication without significant improvement of the pain.  Surgery is consulted by Joseph Art, [place] in this context for evaluation and management of acute cholecystitis.  PAST MEDICAL HISTORY (PMH):  Past Medical History:  Diagnosis Date   Abnormal Pap smear    Abnormal Pap smear of cervix    Anxiety    Hypertension      PAST SURGICAL HISTORY (PSH):  Past Surgical History:  Procedure Laterality Date   CYSTOSCOPY N/A 01/25/2020   Procedure: CYSTOSCOPY;  Surgeon: Vena Austria, MD;  Location: [facility] ORS;  Service: Gynecology;  Laterality: N/A;   LAPAROSCOPIC OVARIAN CYSTECTOMY Right 04/13/2019   Procedure: LAPAROSCOPIC RIGHT OVARIAN CYSTECTOMY and removal of right paratubal cyst;  Surgeon: Vena Austria, MD;  Location: [facility] ORS;  Service: Gynecology;  Laterality: Right;   LEEP     TOTAL LAPAROSCOPIC HYSTERECTOMY WITH SALPINGECTOMY N/A 01/25/2020   Procedure: TOTAL LAPAROSCOPIC HYSTERECTOMY WITH BILATERAL SALPINGECTOMY;  Surgeon: Vena Austria, MD;  Location: [facility] ORS;  Service: Gynecology;  Laterality: N/A;   WISDOM TOOTH EXTRACTION       MEDICATIONS:  Prior to Admission medications    Medication Sig Start Date End Date Taking? Authorizing Provider  FLUoxetine (PROZAC) 20 MG capsule Take 1 capsule (20 mg total) by mouth daily. 12/11/22  Yes Lorre Munroe, NP  ibuprofen (ADVIL) 200 MG tablet Take 800 mg by mouth daily as needed for mild pain.   Yes [provider]  lovastatin (MEVACOR) 10 MG tablet Take 1 tablet (10 mg total) by mouth at bedtime. 05/01/22  Yes Lorre Munroe, NP     ALLERGIES:  Allergies  Allergen Reactions   Penicillins Other (See Comments)    Did it involve swelling of the face/tongue/throat, SOB, or low BP? Unknown Did it involve sudden or severe rash/hives, skin peeling, or any reaction on the inside of your mouth or nose? Unknown Did you need to seek medical attention at a hospital or doctor's office? Unknown When did it last happen? Childhood allergy If all above answers are "NO", may proceed with cephalosporin use.     Penicillin G     Other Reaction(s): Not available     SOCIAL HISTORY:  Social History   Socioeconomic History   Marital status: Married    Spouse name: Not on file   Number of children: Not on file   Years of education: Not on file   Highest education level: Not on file  Occupational History   Not on file  Tobacco Use   Smoking status: Never   Smokeless tobacco: Never  Vaping Use   Vaping Use: Never used  Substance and Sexual Activity   Alcohol use: Not  Currently    Comment: rare   Drug use: No   Sexual activity: Yes    Birth control/protection: Surgical    Comment: Hysterectomy  Other Topics Concern   Not on file  Social History Narrative   Not on file   Social Determinants of Health   Financial Resource Strain: Not on file  Food Insecurity: No Food Insecurity (01/26/2023)   Hunger Vital Sign    Worried About Running Out of Food in the Last Year: Never true    Ran Out of Food in the Last Year: Never true  Transportation Needs: No Transportation Needs (01/26/2023)   PRAPARE - Therapist, art (Medical): No    Lack of Transportation (Non-Medical): No  Physical Activity: Not on file  Stress: Not on file  Social Connections: Not on file  Intimate Partner Violence: Not At Risk (01/26/2023)   Humiliation, Afraid, Rape, and Kick questionnaire    Fear of Current or Ex-Partner: No    Emotionally Abused: No    Physically Abused: No    Sexually Abused: No     FAMILY HISTORY:  Family History  Problem Relation Age of Onset   Pancreatic cancer Paternal Aunt    Bone cancer Maternal Grandmother    Lung cancer Maternal Grandmother    Colon cancer Paternal Grandfather    Depression Mother    Depression Father    Suicidality Father      REVIEW OF SYSTEMS:  Constitutional: denies weight loss, fever, chills, or sweats  Eyes: denies any other vision changes, history of eye injury  ENT: denies sore throat, hearing problems  Respiratory: denies shortness of breath, wheezing  Cardiovascular: denies chest pain, palpitations  Gastrointestinal: positive abdominal pain, Denies nausea and vomitnig Genitourinary: denies burning with urination or urinary frequency Musculoskeletal: denies any other joint pains or cramps  Skin: denies any other rashes or skin discolorations  Neurological: denies any other headache, dizziness, weakness  Psychiatric: denies any other depression, anxiety   All other review of systems were negative   VITAL SIGNS:  Temp:  [97.7 F (36.5 C)-98.6 F (37 C)] 97.7 F (36.5 C) (04/15 2248) Pulse Rate:  [55-64] 64 (04/16 0337) Resp:  [16-18] 18 (04/16 0337) BP: (124-163)/(79-94) 124/79 (04/16 0337) SpO2:  [98 %-99 %] 99 % (04/16 0337) Weight:  [99.8 kg] 99.8 kg (04/15 1825)     Height: 5\' 6"  (167.6 cm) Weight: 99.8 kg BMI (Calculated): 35.53   INTAKE/OUTPUT:  This shift: No intake/output data recorded.  Last 2 shifts: @IOLAST2SHIFTS @   PHYSICAL EXAM:  Constitutional:  -- Normal body habitus  -- Awake, alert, and oriented x3  Eyes:   -- Pupils equally round and reactive to light  -- No scleral icterus  Ear, nose, and throat:  -- No jugular venous distension  Pulmonary:  -- No crackles  -- Equal breath sounds bilaterally -- Breathing non-labored at rest Cardiovascular:  -- S1, S2 present  -- No pericardial rubs Gastrointestinal:  -- Abdomen soft, tender to palpation in the right upper quadrant, non-distended, no guarding or rebound tenderness -- No abdominal masses appreciated, pulsatile or otherwise  Musculoskeletal and Integumentary:  -- Wounds: None appreciated -- Extremities: B/L UE and LE FROM, hands and feet warm, no edema  Neurologic:  -- Motor function: intact and symmetric -- Sensation: intact and symmetric   Labs:     Latest Ref Rng & Units 01/25/2023    6:29 PM 12/11/2022    3:02 PM 04/30/2022  2:00 PM  CBC  WBC 4.0 - 10.5 K/uL 10.6  9.7  8.8   Hemoglobin 12.0 - 15.0 g/dL 16.1  09.6  04.5   Hematocrit 36.0 - 46.0 % 43.4  40.1  41.8   Platelets 150 - 400 K/uL 347  348  317       Latest Ref Rng & Units 01/25/2023    6:29 PM 12/11/2022    3:02 PM 04/30/2022    2:00 PM  CMP  Glucose 70 - 99 mg/dL 409  811  914   BUN 6 - 20 mg/dL Creatinine 0.44 - 1.00 mg/dL 7.82  9.56  2.13   Sodium 135 - 145 mmol/L 136  140  138   Potassium 3.5 - 5.1 mmol/L 3.9  4.1  3.9   Chloride 98 - 111 mmol/L 104  107  107   CO2 22 - 32 mmol/L Calcium 8.9 - 10.3 mg/dL 9.4  9.3  9.2   Total Protein 6.5 - 8.1 g/dL 7.9  7.0  6.8   Total Bilirubin 0.3 - 1.2 mg/dL 0.7  0.5  0.5   Alkaline Phos 38 - 126 U/L 61     AST 15 - 41 U/L ALT 0 - 44 U/L Imaging studies:  EXAM: CT ABDOMEN AND PELVIS WITH CONTRAST   TECHNIQUE: Multidetector CT imaging of the abdomen and pelvis was performed using the standard protocol following bolus administration of intravenous contrast.   RADIATION DOSE REDUCTION: This exam was performed according to the departmental dose-optimization  program which includes automated exposure control, adjustment of the mA and/or kV according to patient size and/or use of iterative reconstruction technique.   CONTRAST:  OMNIPAQUE IOHEXOL 350 MG/ML SOLN   COMPARISON:  None Available.   FINDINGS: Lower chest: Lung bases are clear.   Hepatobiliary: Liver is within normal limits.   Multiple layering gallstones (series 2/image 22), without associated inflammatory changes. No intrahepatic or extrahepatic duct dilatation.   Pancreas: Within normal limits.   Spleen: Within normal limits.   Adrenals/Urinary Tract: Adrenal glands are within normal limits.   Kidneys are within normal limits.  No hydronephrosis.   Bladder is within normal limits.   Stomach/Bowel: Stomach is within normal limits.   No evidence of bowel obstruction.   Normal appendix (series 2/image 60).   No colonic wall thickening or inflammatory changes.   Vascular/Lymphatic: No evidence of abdominal aortic aneurysm.   Retroaortic left renal vein.   No suspicious abdominopelvic lymphadenopathy.   Reproductive: Status post hysterectomy.   Bilateral ovaries are within normal limits.   Other: No abdominopelvic ascites.   Musculoskeletal: Visualized osseous structures are within normal limits.   IMPRESSION: Cholelithiasis, without associated inflammatory changes.   Otherwise negative CT abdomen/pelvis.     Electronically Signed   By: Charline Bills M.D.   On: 01/25/2023 20:07  Assessment/Plan:  37 y.o. female with acute cholecystitis.  Patient with history, physical exam and images consistent with acute cholecystitis. Patient oriented about diagnosis and surgical management as treatment.   Discussed the risk of surgery including post-op infxn, seroma, biloma, chronic pain, poor-delayed wound healing, retained gallstone, conversion to open procedure, post-op SBO or ileus, and need for additional procedures to address said risks.  The risks  of general anesthetic including MI, CVA, sudden death or even reaction to anesthetic medications  also discussed. Alternatives include continued observation.  Benefits include possible symptom relief, prevention of complications including acute cholecystitis, pancreatitis.  Gae Gallop, MD

## 2023-01-26 NOTE — Discharge Summary (Signed)
  Patient ID: Stacey Huynh MRN: [id number] DOB/AGE: [birth year] 38 y.o.  Admit date: 01/25/2023 Discharge date: 01/26/2023   Discharge Diagnoses:  Principal Problem:   Acute cholecystitis   Procedures: Robotic laparoscopic cholecystectomy  Hospital Course: Patient admitted with acute cholecystitis.  She underwent robotic cholecystectomy.  She is waking up in recovery and feeling comfortable enough to go home.  She endorses having some pain in the left side but controlled with current pain medications.  No nausea or vomiting.  She has tolerated a clear liquid diet.  Physical Exam Vitals reviewed.  Constitutional:      Appearance: She is well-developed.  HENT:     Head: Normocephalic.     Mouth/Throat:     Mouth: Mucous membranes are moist.  Cardiovascular:     Rate and Rhythm: Normal rate and regular rhythm.  Pulmonary:     Effort: Pulmonary effort is normal.     Breath sounds: Normal breath sounds.  Abdominal:     General: Abdomen is flat. Bowel sounds are normal.     Palpations: Abdomen is soft.     Tenderness: There is no guarding.  Skin:    General: Skin is warm.     Capillary Refill: Capillary refill takes less than 2 seconds.  Neurological:     General: No focal deficit present.     Mental Status: She is alert and oriented to person, place, and time.      Consults: None  Disposition:    Allergies as of 01/26/2023       Reactions   Penicillins Other (See Comments)   Did it involve swelling of the face/tongue/throat, SOB, or low BP? Unknown Did it involve sudden or severe rash/hives, skin peeling, or any reaction on the inside of your mouth or nose? Unknown Did you need to seek medical attention at a hospital or doctor's office? Unknown When did it last happen? Childhood allergy If all above answers are "NO", may proceed with cephalosporin use.   Penicillin G    Other Reaction(s): Not available        Medication List     TAKE these medications     FLUoxetine 20 MG capsule Commonly known as: PROZAC Take 1 capsule (20 mg total) by mouth daily.   ibuprofen 200 MG tablet Commonly known as: ADVIL Take 800 mg by mouth daily as needed for mild pain.   lovastatin 10 MG tablet Commonly known as: MEVACOR Take 1 tablet (10 mg total) by mouth at bedtime.   oxyCODONE-acetaminophen 5-325 MG tablet Commonly known as: Percocet Take 1 tablet by mouth every 4 (four) hours as needed for severe pain.        Follow-up Information     Carolan Shiver, MD Follow up in 2 week(s).   Specialty: General Surgery Contact information: [address] [place] [address] [phone]

## 2023-01-26 NOTE — Telephone Encounter (Signed)
ER note reviewed.  She is scheduled for a cholecystectomy today.

## 2023-01-26 NOTE — Op Note (Signed)
Preoperative diagnosis: Acute cholecystitis  Postoperative diagnosis: Acute cholecystitis  Procedure: Robotic Assisted Laparoscopic Cholecystectomy.   Anesthesia: GETA   Surgeon: Dr. Hazle Quant  Wound Classification: Clean Contaminated  Indications: Patient is a 37 y.o. female developed right upper quadrant pain, nausea and on workup was found to have cholelithiasis with a normal common duct unable to control pain at the ER. Robotic Assisted Laparoscopic cholecystectomy was elected.  Findings: Severe pericholecystic edema Stone causing obstruction at the cystic duct.  Critical view of safety achieved Cystic duct and artery identified, ligated and divided Adequate hemostasis           Description of procedure: The patient was placed on the operating table in the supine position. General anesthesia was induced. A time-out was completed verifying correct patient, procedure, site, positioning, and implant(s) and/or special equipment prior to beginning this procedure. An orogastric tube was placed. The abdomen was prepped and draped in the usual sterile fashion.  An incision was made in a natural skin line below the umbilicus.  The fascia was elevated and the Veress needle inserted. Proper position was confirmed by aspiration and saline meniscus test.  The abdomen was insufflated with carbon dioxide to a pressure of 15 mmHg. The patient tolerated insufflation well. A 8-mm trocar was then inserted in optiview fashion.  The laparoscope was inserted and the abdomen inspected. No injuries from initial trocar placement were noted. Additional trocars were then inserted in the following locations: an 8-mm trocar in the left lateral abdomen, and another two 8-mm trocars to the right side of the abdomen 5 cm appart. The umbilical trocar was changed to a 12 mm trocar all under direct visualization. The abdomen was inspected and no abnormalities were found. The table was placed in the reverse  Trendelenburg position with the right side up. The robotic arms were docked and target anatomy identified. Instrument inserted under direct visualization.  Filmy adhesions between the gallbladder and omentum, duodenum and transverse colon were lysed with electrocautery. The dome of the gallbladder was grasped with a prograsp and retracted over the dome of the liver. The infundibulum was also grasped with an atraumatic grasper and retracted toward the right lower quadrant. This maneuver exposed Calot's triangle. The peritoneum overlying the gallbladder infundibulum was then incised and the cystic duct and cystic artery identified and circumferentially dissected. Critical view of safety reviewed before ligating any structure. Firefly images taken to visualize biliary ducts. The cystic artery was double ligated and divided. The cystic duct was found obstructed with stone. Clip placed at gallbladder neck and cystic duct divided. Stone removed from cystic duct and then doubly clip.  The gallbladder was then dissected from its peritoneal attachments by electrocautery. Hemostasis was checked and the gallbladder and contained stones were removed using an endoscopic retrieval bag. The gallbladder was passed off the table as a specimen. The gallbladder fossa was copiously irrigated with saline and hemostasis was obtained. There was no evidence of bleeding from the gallbladder fossa or cystic artery or leakage of the bile from the cystic duct stump. Secondary trocars were removed under direct vision. No bleeding was noted. The robotic arms were undoked. The scope was withdrawn and the umbilical trocar removed. The abdomen was allowed to collapse. The fascia of the 12mm trocar sites was closed with figure-of-eight 0 vicryl sutures. The skin was closed with subcuticular sutures of 4-0 monocryl and topical skin adhesive. The orogastric tube was removed.  The patient tolerated the procedure well and was taken to the  postanesthesia care unit in stable condition.   Specimen: Gallbladder  Complications: None  EBL: 5 mL

## 2023-01-26 NOTE — Transfer of Care (Signed)
Immediate Anesthesia Transfer of Care Note  Patient: Stacey Huynh  Procedure(s) Performed: XI ROBOTIC ASSISTED LAPAROSCOPIC CHOLECYSTECTOMY (Abdomen) INDOCYANINE GREEN FLUORESCENCE IMAGING (ICG) (Abdomen)  Patient Location: PACU  Anesthesia Type:General  Level of Consciousness: awake, alert , and oriented  Airway & Oxygen Therapy: Patient Spontanous Breathing and Patient connected to face mask oxygen  Post-op Assessment: Report given to RN and Post -op Vital signs reviewed and stable  Post vital signs: stable  Last Vitals:  Vitals Value Taken Time  BP 119/56 01/26/23 1330  Temp 37 C 01/26/23 1330  Pulse 73 01/26/23 1338  Resp 11 01/26/23 1338  SpO2 100 % 01/26/23 1338  Vitals shown include unvalidated device data.  Last Pain:  Vitals:   01/26/23 1330  TempSrc:   PainSc: Asleep         Complications: No notable events documented.

## 2023-01-26 NOTE — Discharge Instructions (Addendum)
  Diet: Resume home heart healthy regular diet.   Activity: No heavy lifting >20 pounds (children, pets, laundry, garbage) or strenuous activity until follow-up, but light activity and walking are encouraged. Do not drive or drink alcohol if taking narcotic pain medications.  Wound care: May shower with soapy water and pat dry (do not rub incisions), but no baths or submerging incision underwater until follow-up. (no swimming)   Medications: Resume all home medications. For mild to moderate pain: acetaminophen (Tylenol) ***or ibuprofen (if no kidney disease). Combining Tylenol with alcohol can substantially increase your risk of causing liver disease. Narcotic pain medications, if prescribed, can be used for severe pain, though may cause nausea, constipation, and drowsiness. Do not combine Tylenol and Norco within a 6 hour period as Norco contains Tylenol. If you do not need the narcotic pain medication, you do not need to fill the prescription.  Call office ([phone]) at any time if any questions, worsening pain, fevers/chills, bleeding, drainage from incision site, or other concerns.   AMBULATORY SURGERY  DISCHARGE INSTRUCTIONS   The drugs that you were given will stay in your system until tomorrow so for the next 24 hours you should not:  Drive an automobile Make any legal decisions Drink any alcoholic beverage   You may resume regular meals tomorrow.  Today it is better to start with liquids and gradually work up to solid foods.  You may eat anything you prefer, but it is better to start with liquids, then soup and crackers, and gradually work up to solid foods.   Please notify your doctor immediately if you have any unusual bleeding, trouble breathing, redness and pain at the surgery site, drainage, fever, or pain not relieved by medication.    Additional Instructions:        Please contact your physician with any problems or Same Day Surgery at [phone], Monday  through Friday 6 am to 4 pm, or [place] [facility] [place] Main number at [phone].  

## 2023-01-26 NOTE — Anesthesia Postprocedure Evaluation (Signed)
Anesthesia Post Note  Patient: CAREE WOLPERT  Procedure(s) Performed: XI ROBOTIC ASSISTED LAPAROSCOPIC CHOLECYSTECTOMY (Abdomen) INDOCYANINE GREEN FLUORESCENCE IMAGING (ICG) (Abdomen)  Patient location during evaluation: PACU Anesthesia Type: General Level of consciousness: awake and alert Pain management: pain level controlled Vital Signs Assessment: post-procedure vital signs reviewed and stable Respiratory status: spontaneous breathing, nonlabored ventilation, respiratory function stable and patient connected to nasal cannula oxygen Cardiovascular status: blood pressure returned to baseline and stable Postop Assessment: no apparent nausea or vomiting Anesthetic complications: no   No notable events documented.   Last Vitals:  Vitals:   01/26/23 1430 01/26/23 1445  BP: 125/75 122/70  Pulse: 83 73  Resp: 16 16  Temp: 36.4 C 36.5 C  SpO2: 94% 95%    Last Pain:  Vitals:   01/26/23 1445  TempSrc: Temporal  PainSc: 3                  Cleda Mccreedy Yanil Dawe

## 2023-01-26 NOTE — Anesthesia Procedure Notes (Signed)
Procedure Name: Intubation Date/Time: 01/26/2023 12:11 PM  Performed by: Emeterio Reeve, CRNAPre-anesthesia Checklist: Patient identified, Emergency Drugs available, Suction available and Patient being monitored Patient Re-evaluated:Patient Re-evaluated prior to induction Oxygen Delivery Method: Circle system utilized Preoxygenation: Pre-oxygenation with 100% oxygen Induction Type: IV induction Ventilation: Mask ventilation without difficulty Laryngoscope Size: Mac and 4 Grade View: Grade I Tube type: Oral Tube size: 7.0 mm Number of attempts: 1 Airway Equipment and Method: Stylet and Oral airway Placement Confirmation: ETT inserted through vocal cords under direct vision, positive ETCO2 and breath sounds checked- equal and bilateral Secured at: 22 cm Tube secured with: Tape Dental Injury: Teeth and Oropharynx as per pre-operative assessment  Comments: Cords clear. CA

## 2023-01-26 NOTE — Telephone Encounter (Signed)
FYI

## 2023-01-26 NOTE — Anesthesia Preprocedure Evaluation (Signed)
Anesthesia Evaluation  Patient identified by MRN, date of birth, ID band Patient awake    Reviewed: Allergy & Precautions, NPO status , Patient's Chart, lab work & pertinent test results  History of Anesthesia Complications Negative for: history of anesthetic complications  Airway Mallampati: II  TM Distance: >3 FB Neck ROM: full    Dental  (+) Chipped   Pulmonary neg pulmonary ROS, neg shortness of breath   Pulmonary exam normal        Cardiovascular Exercise Tolerance: Good hypertension, (-) angina (-) Past MI Normal cardiovascular exam     Neuro/Psych   Anxiety     negative neurological ROS     GI/Hepatic negative GI ROS, Neg liver ROS,neg GERD  ,,  Endo/Other  negative endocrine ROS    Renal/GU      Musculoskeletal   Abdominal   Peds  Hematology negative hematology ROS (+)   Anesthesia Other Findings Past Medical History: No date: Abnormal Pap smear No date: Abnormal Pap smear of cervix No date: Anxiety No date: Hypertension  Past Surgical History: 01/25/2020: CYSTOSCOPY; N/A     Comment:  Procedure: CYSTOSCOPY;  Surgeon: Vena Austria, MD;               Location: [facility] ORS;  Service: Gynecology;  Laterality:               N/A; 04/13/2019: LAPAROSCOPIC OVARIAN CYSTECTOMY; Right     Comment:  Procedure: LAPAROSCOPIC RIGHT OVARIAN CYSTECTOMY and               removal of right paratubal cyst;  Surgeon: Vena Austria, MD;  Location: [facility] ORS;  Service: Gynecology;                Laterality: Right; No date: LEEP 01/25/2020: TOTAL LAPAROSCOPIC HYSTERECTOMY WITH SALPINGECTOMY; N/A     Comment:  Procedure: TOTAL LAPAROSCOPIC HYSTERECTOMY WITH               BILATERAL SALPINGECTOMY;  Surgeon: Vena Austria, MD;              Location: [facility] ORS;  Service: Gynecology;  Laterality:               N/A; No date: WISDOM TOOTH EXTRACTION  BMI    Body Mass Index: 35.51 kg/m       Reproductive/Obstetrics negative OB ROS                             Anesthesia Physical Anesthesia Plan  ASA: 2  Anesthesia Plan: General ETT   Post-op Pain Management:    Induction: Intravenous  PONV Risk Score and Plan: Ondansetron, Dexamethasone, Midazolam and Treatment may vary due to age or medical condition  Airway Management Planned: Oral ETT  Additional Equipment:   Intra-op Plan:   Post-operative Plan: Extubation in OR  Informed Consent: I have reviewed the patients History and Physical, chart, labs and discussed the procedure including the risks, benefits and alternatives for the proposed anesthesia with the patient or authorized representative who has indicated his/her understanding and acceptance.     Dental Advisory Given  Plan Discussed with: Anesthesiologist, CRNA and Surgeon  Anesthesia Plan Comments: (Patient consented for risks of anesthesia including but not limited to:  - adverse reactions to medications - damage to eyes, teeth, lips or other oral mucosa - nerve damage due to positioning  -  sore throat or hoarseness - Damage to heart, brain, nerves, lungs, other parts of body or loss of life  Patient voiced understanding.)       Anesthesia Quick Evaluation

## 2023-01-27 LAB — SURGICAL PATHOLOGY

## 2023-01-28 DIAGNOSIS — Z029 Encounter for administrative examinations, unspecified: Secondary | ICD-10-CM | POA: Diagnosis not present

## 2023-02-20 ENCOUNTER — Encounter: Payer: Self-pay | Admitting: Internal Medicine

## 2023-04-29 ENCOUNTER — Encounter: Payer: Self-pay | Admitting: Internal Medicine

## 2023-04-30 ENCOUNTER — Encounter: Payer: Self-pay | Admitting: Internal Medicine

## 2023-04-30 ENCOUNTER — Ambulatory Visit: Payer: BC Managed Care – PPO | Admitting: Internal Medicine

## 2023-04-30 VITALS — BP 138/80 | HR 95 | Ht 66.0 in | Wt 225.0 lb

## 2023-04-30 DIAGNOSIS — I10 Essential (primary) hypertension: Secondary | ICD-10-CM | POA: Diagnosis not present

## 2023-04-30 DIAGNOSIS — Z6836 Body mass index (BMI) 36.0-36.9, adult: Secondary | ICD-10-CM

## 2023-04-30 MED ORDER — LISINOPRIL 5 MG PO TABS: 5.0000 mg | ORAL_TABLET | Freq: Every day | ORAL | 1 refills | Status: DC

## 2023-04-30 NOTE — Progress Notes (Signed)
Subjective:    Patient ID: RIATA IKEDA, female    DOB: [birth year], 37 y.o.   MRN: [id number]  HPI  Pt presents to the clinic today for a blood pressure check. She reports her blood pressures have been elevated at home, 140's/80's. She reports she can tell when it was high because she gets flushed and has a headache. She used to be on lisinopril 10 mg daily but stopped due to low blood pressures. ECG from 01/2020 reviewed.  Review of Systems     Past Medical History:  Diagnosis Date   Abnormal Pap smear    Abnormal Pap smear of cervix    Anxiety    Hypertension     Current Outpatient Medications  Medication Sig Dispense Refill   FLUoxetine (PROZAC) 20 MG capsule Take 1 capsule (20 mg total) by mouth daily. 90 capsule 1   ibuprofen (ADVIL) 200 MG tablet Take 800 mg by mouth daily as needed for mild pain.     lovastatin (MEVACOR) 10 MG tablet Take 1 tablet (10 mg total) by mouth at bedtime. 90 tablet 1   oxyCODONE-acetaminophen (PERCOCET) 5-325 MG tablet Take 1 tablet by mouth every 4 (four) hours as needed for severe pain. 20 tablet 0   No current facility-administered medications for this visit.    Allergies  Allergen Reactions   Penicillins Other (See Comments)    Did it involve swelling of the face/tongue/throat, SOB, or low BP? Unknown Did it involve sudden or severe rash/hives, skin peeling, or any reaction on the inside of your mouth or nose? Unknown Did you need to seek medical attention at a hospital or doctor's office? Unknown When did it last happen? Childhood allergy If all above answers are "NO", may proceed with cephalosporin use.     Penicillin G     Other Reaction(s): Not available    Family History  Problem Relation Age of Onset   Pancreatic cancer Paternal Aunt    Bone cancer Maternal Grandmother    Lung cancer Maternal Grandmother    Colon cancer Paternal Grandfather    Depression Mother    Depression Father    Suicidality Father     Social  History   Socioeconomic History   Marital status: Married    Spouse name: Not on file   Number of children: Not on file   Years of education: Not on file   Highest education level: Not on file  Occupational History   Not on file  Tobacco Use   Smoking status: Never   Smokeless tobacco: Never  Vaping Use   Vaping status: Never Used  Substance and Sexual Activity   Alcohol use: Not Currently    Comment: rare   Drug use: No   Sexual activity: Yes    Birth control/protection: Surgical    Comment: Hysterectomy  Other Topics Concern   Not on file  Social History Narrative   Not on file   Social Determinants of Health   Financial Resource Strain: Not on file  Food Insecurity: No Food Insecurity (01/26/2023)   Hunger Vital Sign    Worried About Running Out of Food in the Last Year: Never true    Ran Out of Food in the Last Year: Never true  Transportation Needs: No Transportation Needs (01/26/2023)   PRAPARE - Administrator, Civil Service (Medical): No    Lack of Transportation (Non-Medical): No  Physical Activity: Not on file  Stress: Not on file  Social Connections:  Not on file  Intimate Partner Violence: Not At Risk (01/26/2023)   Humiliation, Afraid, Rape, and Kick questionnaire    Fear of Current or Ex-Partner: No    Emotionally Abused: No    Physically Abused: No    Sexually Abused: No     Constitutional: Denies fever, malaise, fatigue, headache or abrupt weight changes.  Respiratory: Denies difficulty breathing, shortness of breath, cough or sputum production.   Cardiovascular: Denies chest pain, chest tightness, palpitations or swelling in the hands or feet.  Musculoskeletal: Denies decrease in range of motion, difficulty with gait, muscle pain or joint pain and swelling.  Skin: Denies redness, rashes, lesions or ulcercations.  Neurological: Denies dizziness, difficulty with memory, difficulty with speech or problems with balance and coordination.   Psych: Pt has a history of anxiety. Denies depression, SI/HI.  No other specific complaints in a complete review of systems (except as listed in HPI above).  Objective:   Physical Exam   BP 138/80   Pulse 95   Ht 5\' 6"  (1.676 m)   Wt 225 lb (102.1 kg)   LMP 11/09/2019   SpO2 99%   BMI 36.32 kg/m   Wt Readings from Last 3 Encounters:  01/26/23 220 lb (99.8 kg)  12/11/22 222 lb (100.7 kg)  11/24/22 220 lb 0.3 oz (99.8 kg)    General: Appears her stated age, obese, in NAD. HEENT: Head: normal shape and size; Eyes: sclera white, no icterus, conjunctiva pink, PERRLA and EOMs intact;  Cardiovascular: Normal rate and rhythm. S1,S2 noted.  No murmur, rubs or gallops noted. No JVD or BLE edema.  Pulmonary/Chest: Normal effort and positive vesicular breath sounds. No respiratory distress. No wheezes, rales or ronchi noted.  Musculoskeletal: No difficulty with gait.  Neurological: Alert and oriented. Coordination normal.      BMET    Component Value Date/Time   NA 136 01/25/2023 1829   NA 139 05/12/2017 0000   K 3.9 01/25/2023 1829   CL 104 01/25/2023 1829   CO2 24 01/25/2023 1829   GLUCOSE 135 (H) 01/25/2023 1829   BUN 12 01/25/2023 1829   BUN 9 05/12/2017 0000   CREATININE 0.77 01/25/2023 1829   CREATININE 0.72 12/11/2022 1502   CALCIUM 9.4 01/25/2023 1829   GFRNONAA >60 01/25/2023 1829   GFRNONAA 82 06/06/2020 1618   GFRAA 95 06/06/2020 1618    Lipid Panel     Component Value Date/Time   CHOL 177 12/11/2022 1502   CHOL 193 05/12/2017 0000   TRIG 146 12/11/2022 1502   HDL 47 (L) 12/11/2022 1502   HDL 40 05/12/2017 0000   CHOLHDL 3.8 12/11/2022 1502   LDLCALC 104 (H) 12/11/2022 1502    CBC    Component Value Date/Time   WBC 10.6 (H) 01/25/2023 1829   RBC 4.72 01/25/2023 1829   HGB 14.3 01/25/2023 1829   HGB 14.4 05/12/2017 0000   HCT 43.4 01/25/2023 1829   HCT 41.5 05/12/2017 0000   PLT 347 01/25/2023 1829   PLT 273 05/12/2017 0000   MCV 91.9  01/25/2023 1829   MCV 89 05/12/2017 0000   MCH 30.3 01/25/2023 1829   MCHC 32.9 01/25/2023 1829   RDW 12.3 01/25/2023 1829   RDW 13.3 05/12/2017 0000   LYMPHSABS 1,883 06/06/2020 1618   MONOABS 0.6 04/17/2019 1925   EOSABS 172 06/06/2020 1618   BASOSABS 52 06/06/2020 1618    Hgb A1C Lab Results  Component Value Date   HGBA1C 5.6 12/11/2022  Assessment & Plan:     RTC in 2 months for your annual exam Nicki Reaper, NP

## 2023-04-30 NOTE — Assessment & Plan Note (Signed)
Encouraged diet and exercise for weight loss ?

## 2023-04-30 NOTE — Assessment & Plan Note (Signed)
Borderline elevated Will start lisinopril 5 mg daily Reinforced DASH diet and exercise for weight loss

## 2023-04-30 NOTE — Patient Instructions (Signed)

## 2023-05-17 ENCOUNTER — Other Ambulatory Visit: Payer: Self-pay | Admitting: Internal Medicine

## 2023-05-18 NOTE — Telephone Encounter (Signed)
Requested Prescriptions  Pending Prescriptions Disp Refills   lovastatin (MEVACOR) 10 MG tablet [Pharmacy Med Name: LOVASTATIN 10 MG TABLET] 90 tablet 0    Sig: TAKE 1 TABLET BY MOUTH EVERYDAY AT BEDTIME     Cardiovascular:  Antilipid - Statins 2 Failed - 05/17/2023  4:19 PM      Failed - Lipid Panel in normal range within the last 12 months    Cholesterol, Total  Date Value Ref Range Status  05/12/2017 193 100 - 199 mg/dL Final   Cholesterol  Date Value Ref Range Status  12/11/2022 177 <200 mg/dL Final   LDL Cholesterol (Calc)  Date Value Ref Range Status  12/11/2022 104 (H) mg/dL (calc) Final    Comment:    Reference range: <100 . Desirable range <100 mg/dL for primary prevention;   <70 mg/dL for patients with CHD or diabetic patients  with > or = 2 CHD risk factors. Marland Kitchen LDL-C is now calculated using the Martin-Hopkins  calculation, which is a validated novel method providing  better accuracy than the Friedewald equation in the  estimation of LDL-C.  Horald Pollen et al. Lenox Ahr. 1610;960(45): 2061-2068  (http://education.[employer].com/faq/FAQ164)    HDL  Date Value Ref Range Status  12/11/2022 47 (L) > OR = 50 mg/dL Final  40/98/1191 40 >47 mg/dL Final   Triglycerides  Date Value Ref Range Status  12/11/2022 146 <150 mg/dL Final         Passed - Cr in normal range and within 360 days    Creat  Date Value Ref Range Status  12/11/2022 0.72 0.50 - 0.97 mg/dL Final   Creatinine, Ser  Date Value Ref Range Status  01/25/2023 0.77 0.44 - 1.00 mg/dL Final         Passed - Patient is not pregnant      Passed - Valid encounter within last 12 months    Recent Outpatient Visits           2 weeks ago Primary hypertension   [place] [facility] [place], Salvadore Oxford, NP   5 months ago Mixed hyperlipidemia   [place] [facility] [place], Salvadore Oxford, NP   1 year ago Encounter for general adult medical examination with abnormal findings    [place] [facility] [place], Salvadore Oxford, NP   1 year ago Community acquired pneumonia of right lower lobe of lung   [place] [facility] [place], Salvadore Oxford, NP   2 years ago Encounter for general adult medical examination with abnormal findings    [facility] [place], Salvadore Oxford, NP       Future Appointments             In 4 weeks Sam[place] Si, Salvadore Oxford, NP  [facility]

## 2023-06-15 ENCOUNTER — Encounter: Payer: BC Managed Care – PPO | Admitting: Internal Medicine

## 2023-06-15 NOTE — Progress Notes (Deleted)
Subjective:    Patient ID: Stacey Huynh, female    DOB: Nov 18, 1985, 37 y.o.   MRN: [id number]  HPI  Patient presents to clinic today for her annual exam.  Flu: 10/2018 Tetanus: 12/2012 COVID: Never Pap smear: Hysterectomy Dentist:  Diet: Exercise:  Review of Systems     Past Medical History:  Diagnosis Date   Abnormal Pap smear    Abnormal Pap smear of cervix    Anxiety    Hypertension     Current Outpatient Medications  Medication Sig Dispense Refill   FLUoxetine (PROZAC) 20 MG capsule Take 1 capsule (20 mg total) by mouth daily. 90 capsule 1   ibuprofen (ADVIL) 200 MG tablet Take 800 mg by mouth daily as needed for mild pain.     lisinopril (ZESTRIL) 5 MG tablet Take 1 tablet (5 mg total) by mouth daily. 90 tablet 1   lovastatin (MEVACOR) 10 MG tablet TAKE 1 TABLET BY MOUTH EVERYDAY AT BEDTIME 90 tablet 0   No current facility-administered medications for this visit.    Allergies  Allergen Reactions   Penicillins Other (See Comments)    Did it involve swelling of the face/tongue/throat, SOB, or low BP? Unknown Did it involve sudden or severe rash/hives, skin peeling, or any reaction on the inside of your mouth or nose? Unknown Did you need to seek medical attention at a hospital or doctor's office? Unknown When did it last happen? Childhood allergy If all above answers are "NO", may proceed with cephalosporin use.     Penicillin G     Other Reaction(s): Not available    Family History  Problem Relation Age of Onset   Pancreatic cancer Paternal Aunt    Bone cancer Maternal Grandmother    Lung cancer Maternal Grandmother    Colon cancer Paternal Grandfather    Depression Mother    Depression Father    Suicidality Father     Social History   Socioeconomic History   Marital status: Married    Spouse name: Not on file   Number of children: Not on file   Years of education: Not on file   Highest education level: Not on file  Occupational History    Not on file  Tobacco Use   Smoking status: Never   Smokeless tobacco: Never  Vaping Use   Vaping status: Never Used  Substance and Sexual Activity   Alcohol use: Not Currently    Comment: rare   Drug use: No   Sexual activity: Yes    Birth control/protection: Surgical    Comment: Hysterectomy  Other Topics Concern   Not on file  Social History Narrative   Not on file   Social Determinants of Health   Financial Resource Strain: Not on file  Food Insecurity: No Food Insecurity (01/26/2023)   Hunger Vital Sign    Worried About Running Out of Food in the Last Year: Never true    Ran Out of Food in the Last Year: Never true  Transportation Needs: No Transportation Needs (01/26/2023)   PRAPARE - Administrator, Civil Service (Medical): No    Lack of Transportation (Non-Medical): No  Physical Activity: Not on file  Stress: Not on file  Social Connections: Not on file  Intimate Partner Violence: Not At Risk (01/26/2023)   Humiliation, Afraid, Rape, and Kick questionnaire    Fear of Current or Ex-Partner: No    Emotionally Abused: No    Physically Abused: No  Sexually Abused: No     Constitutional: Denies fever, malaise, fatigue, headache or abrupt weight changes.  HEENT: Denies eye pain, eye redness, ear pain, ringing in the ears, wax buildup, runny nose, nasal congestion, bloody nose, or sore throat. Respiratory: Denies difficulty breathing, shortness of breath, cough or sputum production.   Cardiovascular: Denies chest pain, chest tightness, palpitations or swelling in the hands or feet.  Gastrointestinal: Denies abdominal pain, bloating, constipation, diarrhea or blood in the stool.  GU: Denies urgency, frequency, pain with urination, burning sensation, blood in urine, odor or discharge. Musculoskeletal: Denies decrease in range of motion, difficulty with gait, muscle pain or joint pain and swelling.  Skin: Denies redness, rashes, lesions or ulcercations.   Neurological: Denies dizziness, difficulty with memory, difficulty with speech or problems with balance and coordination.  Psych: Patient has a history of anxiety.  Denies depression, SI/HI.  No other specific complaints in a complete review of systems (except as listed in HPI above).  Objective:   Physical Exam  LMP 11/09/2019  Wt Readings from Last 3 Encounters:  04/30/23 225 lb (102.1 kg)  01/26/23 220 lb (99.8 kg)  12/11/22 222 lb (100.7 kg)    General: Appears their stated age, well developed, well nourished in NAD. Skin: Warm, dry and intact. No rashes, lesions or ulcerations noted. HEENT: Head: normal shape and size; Eyes: sclera white, no icterus, conjunctiva pink, PERRLA and EOMs intact; Ears: Tm's gray and intact, normal light reflex; Nose: mucosa pink and moist, septum midline; Throat/Mouth: Teeth present, mucosa pink and moist, no exudate, lesions or ulcerations noted.  Neck:  Neck supple, trachea midline. No masses, lumps or thyromegaly present.  Cardiovascular: Normal rate and rhythm. S1,S2 noted.  No murmur, rubs or gallops noted. No JVD or BLE edema. No carotid bruits noted. Pulmonary/Chest: Normal effort and positive vesicular breath sounds. No respiratory distress. No wheezes, rales or ronchi noted.  Abdomen: Soft and nontender. Normal bowel sounds. No distention or masses noted. Liver, spleen and kidneys non palpable. Musculoskeletal: Normal range of motion. No signs of joint swelling. No difficulty with gait.  Neurological: Alert and oriented. Cranial nerves II-XII grossly intact. Coordination normal.  Psychiatric: Mood and affect normal. Behavior is normal. Judgment and thought content normal.     BMET    Component Value Date/Time   NA 136 01/25/2023 1829   NA 139 05/12/2017 0000   K 3.9 01/25/2023 1829   CL 104 01/25/2023 1829   CO2 24 01/25/2023 1829   GLUCOSE 135 (H) 01/25/2023 1829   BUN 12 01/25/2023 1829   BUN 9 05/12/2017 0000   CREATININE 0.77  01/25/2023 1829   CREATININE 0.72 12/11/2022 1502   CALCIUM 9.4 01/25/2023 1829   GFRNONAA >60 01/25/2023 1829   GFRNONAA 82 06/06/2020 1618   GFRAA 95 06/06/2020 1618    Lipid Panel     Component Value Date/Time   CHOL 177 12/11/2022 1502   CHOL 193 05/12/2017 0000   TRIG 146 12/11/2022 1502   HDL 47 (L) 12/11/2022 1502   HDL 40 05/12/2017 0000   CHOLHDL 3.8 12/11/2022 1502   LDLCALC 104 (H) 12/11/2022 1502    CBC    Component Value Date/Time   WBC 10.6 (H) 01/25/2023 1829   RBC 4.72 01/25/2023 1829   HGB 14.3 01/25/2023 1829   HGB 14.4 05/12/2017 0000   HCT 43.4 01/25/2023 1829   HCT 41.5 05/12/2017 0000   PLT 347 01/25/2023 1829   PLT 273 05/12/2017 0000   MCV  91.9 01/25/2023 1829   MCV 89 05/12/2017 0000   MCH 30.3 01/25/2023 1829   MCHC 32.9 01/25/2023 1829   RDW 12.3 01/25/2023 1829   RDW 13.3 05/12/2017 0000   LYMPHSABS 1,883 06/06/2020 1618   MONOABS 0.6 04/17/2019 1925   EOSABS 172 06/06/2020 1618   BASOSABS 52 06/06/2020 1618    Hgb A1C Lab Results  Component Value Date   HGBA1C 5.6 12/11/2022            Assessment & Plan:   Preventative health maintenance:  Flu shot Tetanus Encouraged her to get her COVID-vaccine She no longer needs to screen for cervical cancer Encouraged her to consume a balanced diet and exercise regimen Advised her to see an eye doctor and dentist annually We will check CBC, c-Met, lipid, A1c today  RTC in 6 months, follow-up chronic conditions Nicki Reaper, NP

## 2023-06-22 DIAGNOSIS — M5416 Radiculopathy, lumbar region: Secondary | ICD-10-CM | POA: Diagnosis not present

## 2023-06-22 DIAGNOSIS — M9901 Segmental and somatic dysfunction of cervical region: Secondary | ICD-10-CM | POA: Diagnosis not present

## 2023-06-22 DIAGNOSIS — G43001 Migraine without aura, not intractable, with status migrainosus: Secondary | ICD-10-CM | POA: Diagnosis not present

## 2023-06-22 DIAGNOSIS — M9903 Segmental and somatic dysfunction of lumbar region: Secondary | ICD-10-CM | POA: Diagnosis not present

## 2023-06-25 DIAGNOSIS — M9903 Segmental and somatic dysfunction of lumbar region: Secondary | ICD-10-CM | POA: Diagnosis not present

## 2023-06-25 DIAGNOSIS — M9901 Segmental and somatic dysfunction of cervical region: Secondary | ICD-10-CM | POA: Diagnosis not present

## 2023-06-25 DIAGNOSIS — M5416 Radiculopathy, lumbar region: Secondary | ICD-10-CM | POA: Diagnosis not present

## 2023-06-25 DIAGNOSIS — G43001 Migraine without aura, not intractable, with status migrainosus: Secondary | ICD-10-CM | POA: Diagnosis not present

## 2023-06-28 DIAGNOSIS — M5416 Radiculopathy, lumbar region: Secondary | ICD-10-CM | POA: Diagnosis not present

## 2023-06-28 DIAGNOSIS — M9903 Segmental and somatic dysfunction of lumbar region: Secondary | ICD-10-CM | POA: Diagnosis not present

## 2023-06-28 DIAGNOSIS — G43001 Migraine without aura, not intractable, with status migrainosus: Secondary | ICD-10-CM | POA: Diagnosis not present

## 2023-06-28 DIAGNOSIS — M9901 Segmental and somatic dysfunction of cervical region: Secondary | ICD-10-CM | POA: Diagnosis not present

## 2023-06-30 ENCOUNTER — Ambulatory Visit: Payer: BC Managed Care – PPO | Admitting: Internal Medicine

## 2023-06-30 NOTE — Progress Notes (Deleted)
Subjective:    Patient ID: Stacey Huynh, female    DOB: 12-26-1985, 37 y.o.   MRN: [id number]  HPI  Patient presents to clinic today for her annual exam.  Flu: 10/2018 Tetanus: 12/2012 COVID: Never Pap smear: Hysterectomy Dentist:  Diet: Exercise:  Review of Systems     Past Medical History:  Diagnosis Date   Abnormal Pap smear    Abnormal Pap smear of cervix    Anxiety    Hypertension     Current Outpatient Medications  Medication Sig Dispense Refill   FLUoxetine (PROZAC) 20 MG capsule Take 1 capsule (20 mg total) by mouth daily. 90 capsule 1   ibuprofen (ADVIL) 200 MG tablet Take 800 mg by mouth daily as needed for mild pain.     lisinopril (ZESTRIL) 5 MG tablet Take 1 tablet (5 mg total) by mouth daily. 90 tablet 1   lovastatin (MEVACOR) 10 MG tablet TAKE 1 TABLET BY MOUTH EVERYDAY AT BEDTIME 90 tablet 0   No current facility-administered medications for this visit.    Allergies  Allergen Reactions   Penicillins Other (See Comments)    Did it involve swelling of the face/tongue/throat, SOB, or low BP? Unknown Did it involve sudden or severe rash/hives, skin peeling, or any reaction on the inside of your mouth or nose? Unknown Did you need to seek medical attention at a hospital or doctor's office? Unknown When did it last happen? Childhood allergy If all above answers are "NO", may proceed with cephalosporin use.     Penicillin G     Other Reaction(s): Not available    Family History  Problem Relation Age of Onset   Pancreatic cancer Paternal Aunt    Bone cancer Maternal Grandmother    Lung cancer Maternal Grandmother    Colon cancer Paternal Grandfather    Depression Mother    Depression Father    Suicidality Father     Social History   Socioeconomic History   Marital status: Married    Spouse name: Not on file   Number of children: Not on file   Years of education: Not on file   Highest education level: Not on file  Occupational History    Not on file  Tobacco Use   Smoking status: Never   Smokeless tobacco: Never  Vaping Use   Vaping status: Never Used  Substance and Sexual Activity   Alcohol use: Not Currently    Comment: rare   Drug use: No   Sexual activity: Yes    Birth control/protection: Surgical    Comment: Hysterectomy  Other Topics Concern   Not on file  Social History Narrative   Not on file   Social Determinants of Health   Financial Resource Strain: Not on file  Food Insecurity: No Food Insecurity (01/26/2023)   Hunger Vital Sign    Worried About Running Out of Food in the Last Year: Never true    Ran Out of Food in the Last Year: Never true  Transportation Needs: No Transportation Needs (01/26/2023)   PRAPARE - Administrator, Civil Service (Medical): No    Lack of Transportation (Non-Medical): No  Physical Activity: Not on file  Stress: Not on file  Social Connections: Not on file  Intimate Partner Violence: Not At Risk (01/26/2023)   Humiliation, Afraid, Rape, and Kick questionnaire    Fear of Current or Ex-Partner: No    Emotionally Abused: No    Physically Abused: No  Sexually Abused: No     Constitutional: Denies fever, malaise, fatigue, headache or abrupt weight changes.  HEENT: Denies eye pain, eye redness, ear pain, ringing in the ears, wax buildup, runny nose, nasal congestion, bloody nose, or sore throat. Respiratory: Denies difficulty breathing, shortness of breath, cough or sputum production.   Cardiovascular: Denies chest pain, chest tightness, palpitations or swelling in the hands or feet.  Gastrointestinal: Denies abdominal pain, bloating, constipation, diarrhea or blood in the stool.  GU: Denies urgency, frequency, pain with urination, burning sensation, blood in urine, odor or discharge. Musculoskeletal: Denies decrease in range of motion, difficulty with gait, muscle pain or joint pain and swelling.  Skin: Denies redness, rashes, lesions or ulcercations.   Neurological: Denies dizziness, difficulty with memory, difficulty with speech or problems with balance and coordination.  Psych: Patient has a history of anxiety.  Denies depression, SI/HI.  No other specific complaints in a complete review of systems (except as listed in HPI above).  Objective:   Physical Exam   LMP 11/09/2019  Wt Readings from Last 3 Encounters:  04/30/23 225 lb (102.1 kg)  01/26/23 220 lb (99.8 kg)  12/11/22 222 lb (100.7 kg)    General: Appears their stated age, well developed, well nourished in NAD. Skin: Warm, dry and intact. No rashes, lesions or ulcerations noted. HEENT: Head: normal shape and size; Eyes: sclera white, no icterus, conjunctiva pink, PERRLA and EOMs intact; Ears: Tm's gray and intact, normal light reflex; Nose: mucosa pink and moist, septum midline; Throat/Mouth: Teeth present, mucosa pink and moist, no exudate, lesions or ulcerations noted.  Neck:  Neck supple, trachea midline. No masses, lumps or thyromegaly present.  Cardiovascular: Normal rate and rhythm. S1,S2 noted.  No murmur, rubs or gallops noted. No JVD or BLE edema. No carotid bruits noted. Pulmonary/Chest: Normal effort and positive vesicular breath sounds. No respiratory distress. No wheezes, rales or ronchi noted.  Abdomen: Soft and nontender. Normal bowel sounds. No distention or masses noted. Liver, spleen and kidneys non palpable. Musculoskeletal: Normal range of motion. No signs of joint swelling. No difficulty with gait.  Neurological: Alert and oriented. Cranial nerves II-XII grossly intact. Coordination normal.  Psychiatric: Mood and affect normal. Behavior is normal. Judgment and thought content normal.     BMET    Component Value Date/Time   NA 136 01/25/2023 1829   NA 139 05/12/2017 0000   K 3.9 01/25/2023 1829   CL 104 01/25/2023 1829   CO2 24 01/25/2023 1829   GLUCOSE 135 (H) 01/25/2023 1829   BUN 12 01/25/2023 1829   BUN 9 05/12/2017 0000   CREATININE 0.77  01/25/2023 1829   CREATININE 0.72 12/11/2022 1502   CALCIUM 9.4 01/25/2023 1829   GFRNONAA >60 01/25/2023 1829   GFRNONAA 82 06/06/2020 1618   GFRAA 95 06/06/2020 1618    Lipid Panel     Component Value Date/Time   CHOL 177 12/11/2022 1502   CHOL 193 05/12/2017 0000   TRIG 146 12/11/2022 1502   HDL 47 (L) 12/11/2022 1502   HDL 40 05/12/2017 0000   CHOLHDL 3.8 12/11/2022 1502   LDLCALC 104 (H) 12/11/2022 1502    CBC    Component Value Date/Time   WBC 10.6 (H) 01/25/2023 1829   RBC 4.72 01/25/2023 1829   HGB 14.3 01/25/2023 1829   HGB 14.4 05/12/2017 0000   HCT 43.4 01/25/2023 1829   HCT 41.5 05/12/2017 0000   PLT 347 01/25/2023 1829   PLT 273 05/12/2017 0000  MCV 91.9 01/25/2023 1829   MCV 89 05/12/2017 0000   MCH 30.3 01/25/2023 1829   MCHC 32.9 01/25/2023 1829   RDW 12.3 01/25/2023 1829   RDW 13.3 05/12/2017 0000   LYMPHSABS 1,883 06/06/2020 1618   MONOABS 0.6 04/17/2019 1925   EOSABS 172 06/06/2020 1618   BASOSABS 52 06/06/2020 1618    Hgb A1C Lab Results  Component Value Date   HGBA1C 5.6 12/11/2022           Assessment & Plan:   Preventative health maintenance:  Flu Tetanus Encouraged her to get her COVID-vaccine She no longer needs to screen for cervical cancer Encouraged her to consume a balanced diet and exercise regimen Advised her to see an eye doctor and dentist annually We will check CBC, c-Met, lipid, A1c today  RTC in 6 months, follow-up chronic conditions Nicki Reaper, NP

## 2023-09-29 IMAGING — DX DG CHEST 2V
2 series · 2 of 2 positions shown · non-contrast
Comparison: September 29, 2018.

CLINICAL DATA: Productive cough, dyspnea.

EXAM:
CHEST - 2 VIEW

[chest pa]
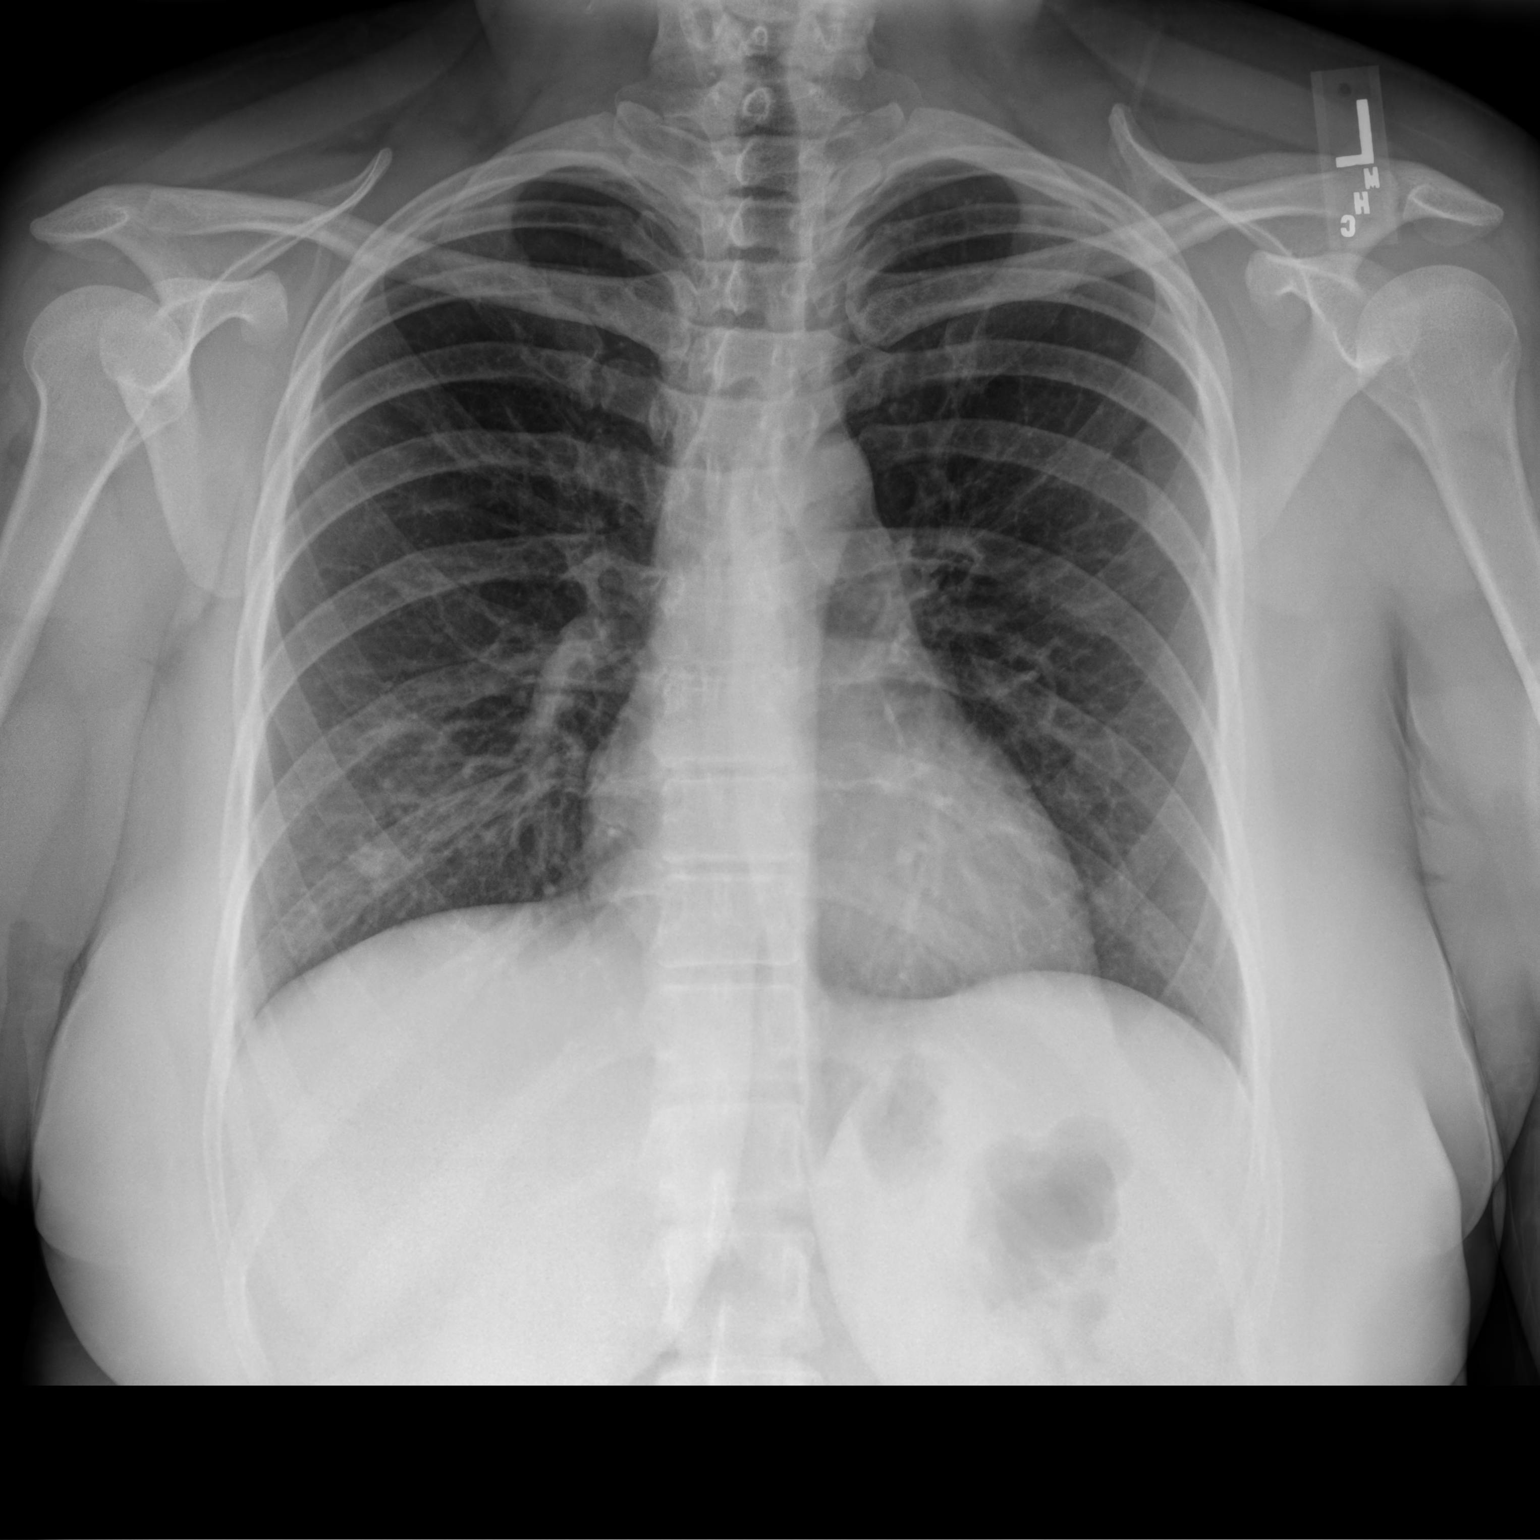

[chest lat]
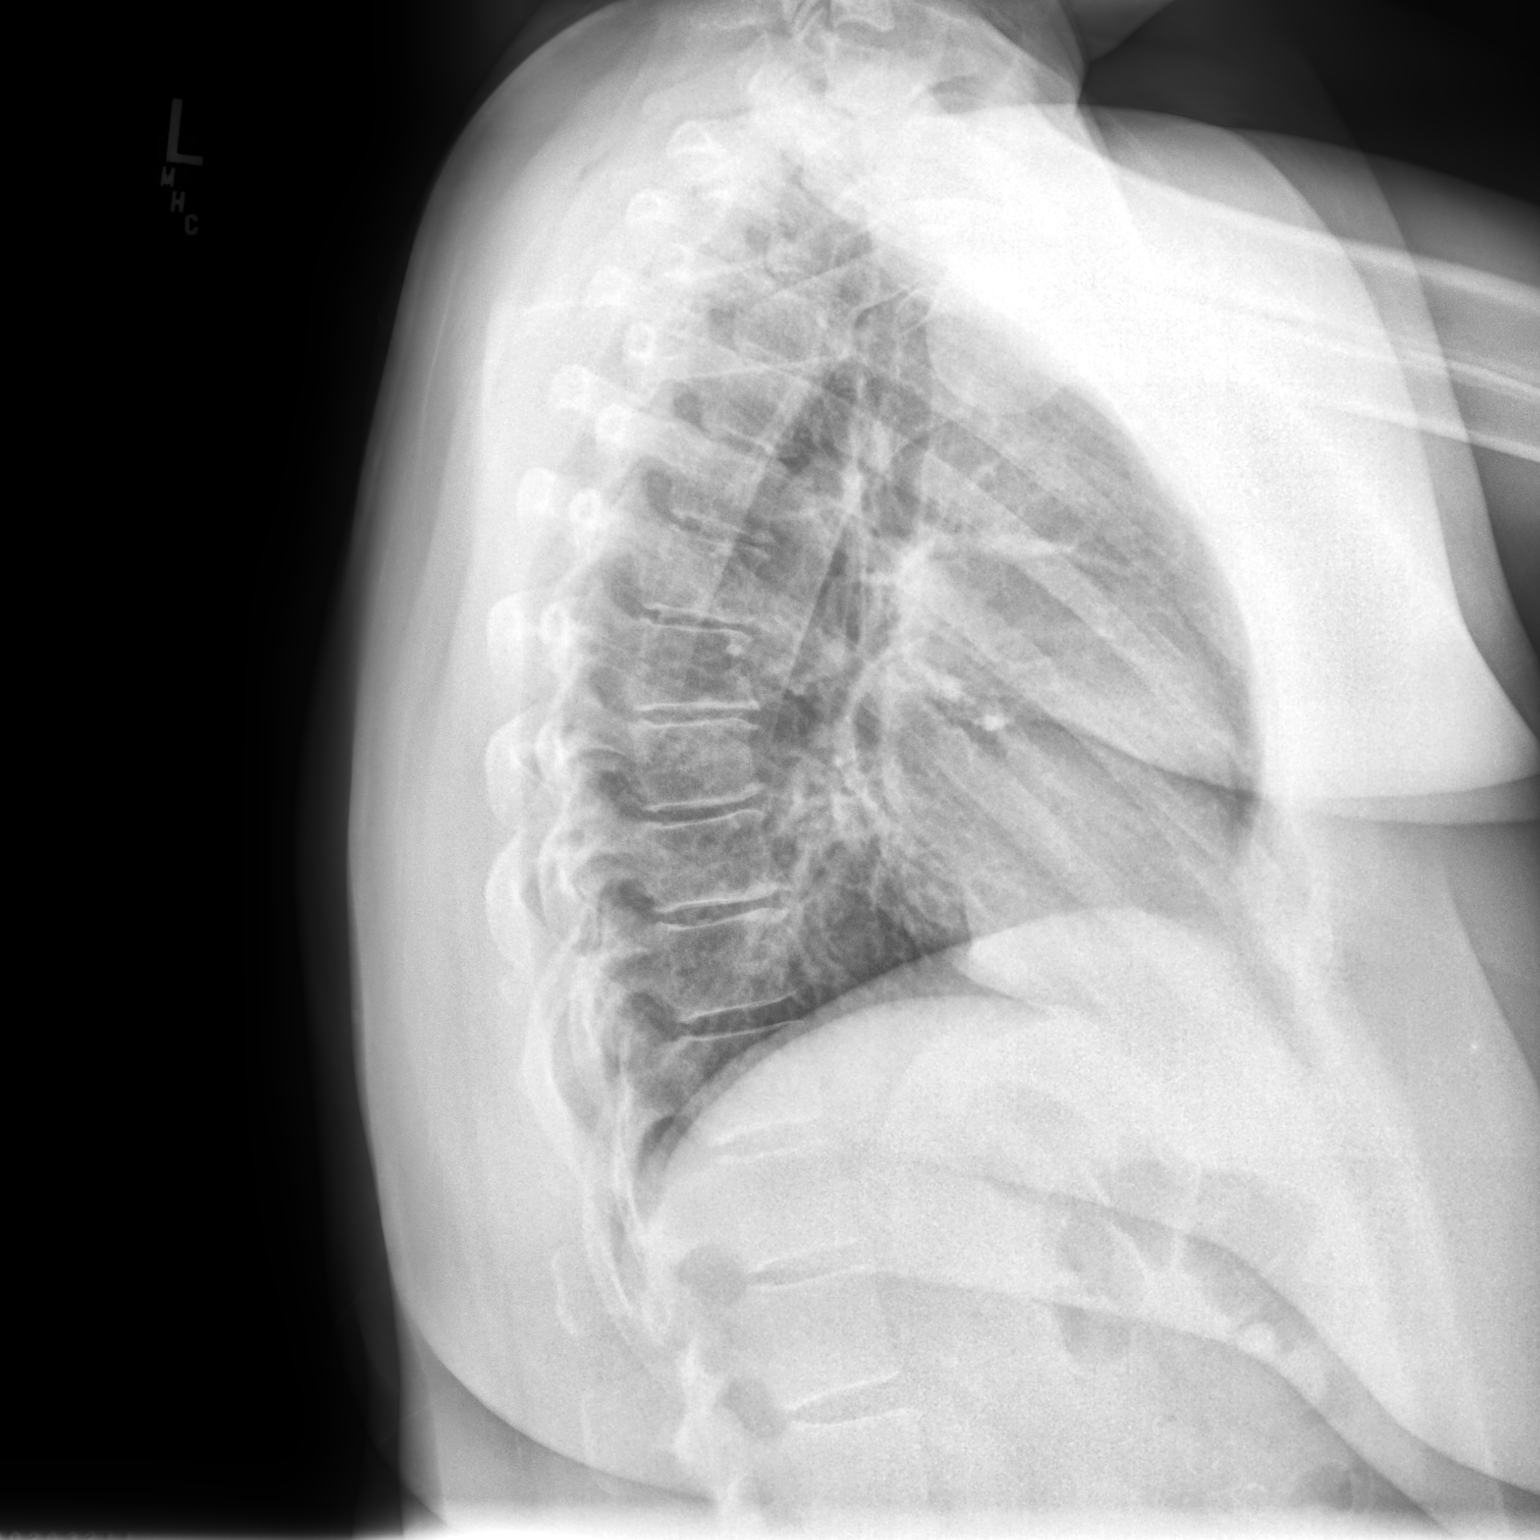

[2 of 2 positions shown; findings below may reference images not displayed]

FINDINGS: The heart size and mediastinal contours are within normal limits.
Nodular density is seen over right lung base which may represent
overlying nipple shadow. Left lung is clear. The visualized skeletal
structures are unremarkable.
IMPRESSION: Nodular density seen over right lung base which may represent
overlying nipple shadow; repeat radiograph with nipple markers is
recommended to rule out pulmonary nodule. No other abnormality is
seen.

## 2023-11-17 ENCOUNTER — Ambulatory Visit: Payer: Self-pay

## 2023-11-17 NOTE — Telephone Encounter (Signed)
     Chief Complaint: Cough, fever Symptoms: Above Frequency: Today Pertinent Negatives: Patient denies  Disposition: [] ED /[] Urgent Care (no appt availability in office) / [x] Appointment(In office/virtual)/ []  [place] [facility]/ [] Home Care/ [] Refused Recommended Disposition /[] [place] [facility]/ []  Follow-up with PCP Additional Notes: Agrees with appointment.  Reason for Disposition . [1] Continuous (nonstop) coughing interferes with work or school AND [2] no improvement using cough treatment per Care Advice  Answer Assessment - Initial Assessment Questions 1. ONSET: When did the cough begin?      Today 2. SEVERITY: How bad is the cough today?      Severe 3. SPUTUM: Describe the color of your sputum (none, dry cough; clear, white, yellow, green)     None 4. HEMOPTYSIS: Are you coughing up any blood? If so ask: How much? (flecks, streaks, tablespoons, etc.)     No 5. DIFFICULTY BREATHING: Are you having difficulty breathing? If Yes, ask: How bad is it? (e.g., mild, moderate, severe)    - MILD: No SOB at rest, mild SOB with walking, speaks normally in sentences, can lie down, no retractions, pulse < 100.    - MODERATE: SOB at rest, SOB with minimal exertion and prefers to sit, cannot lie down flat, speaks in phrases, mild retractions, audible wheezing, pulse 100-120.    - SEVERE: Very SOB at rest, speaks in single words, struggling to breathe, sitting hunched forward, retractions, pulse > 120      Mild 6. FEVER: Do you have a fever? If Yes, ask: What is your temperature, how was it measured, and when did it start?     Yes - 100 7. CARDIAC HISTORY: Do you have any history of heart disease? (e.g., heart attack, congestive heart failure)      No 8. LUNG HISTORY: Do you have any history of lung disease?  (e.g., pulmonary embolus, asthma, emphysema)     No 9. PE RISK FACTORS: Do you have a history of blood clots? (or: recent major surgery, recent  prolonged travel, bedridden)     No 10. OTHER SYMPTOMS: Do you have any other symptoms? (e.g., runny nose, wheezing, chest pain)       Chest is tight, sore throat 11. PREGNANCY: Is there any chance you are pregnant? When was your last menstrual period?       No 12. TRAVEL: Have you traveled out of the country in the last month? (e.g., travel history, exposures)       No  Protocols used: Cough - Acute Non-Productive-A-AH

## 2023-11-18 ENCOUNTER — Ambulatory Visit: Payer: BC Managed Care – PPO | Admitting: Family Medicine

## 2023-11-18 ENCOUNTER — Encounter: Payer: Self-pay | Admitting: Family Medicine

## 2023-11-18 VITALS — BP 122/84 | HR 116 | Temp 100.6°F | Ht 66.0 in | Wt 220.0 lb

## 2023-11-18 DIAGNOSIS — J101 Influenza due to other identified influenza virus with other respiratory manifestations: Secondary | ICD-10-CM | POA: Diagnosis not present

## 2023-11-18 LAB — POC COVID19/FLU A&B COMBO
Covid Antigen, POC: NEGATIVE
Influenza A Antigen, POC: POSITIVE — AB
Influenza B Antigen, POC: NEGATIVE

## 2023-11-18 MED ORDER — PREDNISONE 10 MG PO TABS
ORAL_TABLET | ORAL | 0 refills | Status: DC
Start: 2023-11-18 — End: 2024-01-21

## 2023-11-18 MED ORDER — OSELTAMIVIR PHOSPHATE 75 MG PO CAPS
75.0000 mg | ORAL_CAPSULE | Freq: Two times a day (BID) | ORAL | 0 refills | Status: DC
Start: 2023-11-18 — End: 2024-01-21

## 2023-11-18 MED ORDER — ALBUTEROL SULFATE HFA 108 (90 BASE) MCG/ACT IN AERS
2.0000 | INHALATION_SPRAY | Freq: Four times a day (QID) | RESPIRATORY_TRACT | 2 refills | Status: DC | PRN
Start: 2023-11-18 — End: 2024-01-21

## 2023-11-18 MED ORDER — GUAIFENESIN-CODEINE 100-10 MG/5ML PO SOLN
5.0000 mL | Freq: Three times a day (TID) | ORAL | 0 refills | Status: DC | PRN
Start: 2023-11-18 — End: 2024-01-21

## 2023-11-18 NOTE — Progress Notes (Signed)
 Subjective:    Patient ID: Stacey Huynh, female    DOB: 11/08/1985, 38 y.o.   MRN: [id number]  Stacey Huynh is a 38 y.o. female presenting on 11/18/2023 for No chief complaint on file.  PCP Angeline Laura, FNP  Patient presents for a same day appointment.   HPI  Discussed the use of AI scribe software for clinical note transcription with the patient, who gave verbal consent to proceed.  History of Present Illness    Stacey Huynh is a 38 year old female with recurrent bronchitis who presents with persistent coughing and flu symptoms.  She has been experiencing a persistent cough since yesterday morning, accompanied by fevers and chills that have progressively worsened. No runny nose is present, and she denies a history of asthma. She typically experiences bronchitis annually and suspects her current symptoms might be related to bronchitis as in previous years. Home tests, including for the flu, have returned negative results.  She has attempted to alleviate her symptoms with Mucinex  and another unspecified medication. She has a history of using Tamiflu  a couple of years ago and is open to using it again. She has not used a nasal spray due to a dislike for it and the absence of a runny nose. She has used cough syrup in the past and tolerates codeine  well.  She works a schedule from Sunday through Thursday and has requested a work note for absence on Wednesday and Thursday due to her illness. She plans to return to work on her next scheduled shift on Sunday.            11/18/2023   10:20 AM 04/30/2023    1:16 PM 12/11/2022    3:00 PM  Depression screen PHQ 2/9  Decreased Interest 0 0 0  Down, Depressed, Hopeless 1 0 0  PHQ - 2 Score 1 0 0  Altered sleeping 1    Tired, decreased energy 1    Change in appetite 0    Feeling bad or failure about yourself  0    Trouble concentrating 1    Moving slowly or fidgety/restless 0    Suicidal thoughts 0    PHQ-9 Score 4     Difficult doing work/chores Somewhat difficult         11/18/2023   10:20 AM 04/30/2023    1:16 PM 12/11/2022    3:00 PM 03/26/2022   10:05 AM  GAD 7 : Generalized Anxiety Score  Nervous, Anxious, on Edge 1 2 1 2   Control/stop worrying 0 1 1 2   Worry too much - different things 1 1 1 1   Trouble relaxing 1 1 1 2   Restless 1 0 0 0  Easily annoyed or irritable 1 0 1 1  Afraid - awful might happen 0 0 0 0  Total GAD 7 Score 5 5 5 8   Anxiety Difficulty Somewhat difficult Not difficult at all Not difficult at all     Social History   Tobacco Use   Smoking status: Never   Smokeless tobacco: Never  Vaping Use   Vaping status: Never Used  Substance Use Topics   Alcohol use: Not Currently    Comment: rare   Drug use: No    Review of Systems Per HPI unless specifically indicated above     Objective:    BP 122/84   Pulse (!) 116   Temp (!) 100.6 F (38.1 C)   Ht 5' 6 (1.676 m)   Wt 220  lb (99.8 kg)   LMP 11/09/2019   SpO2 98%   BMI 35.51 kg/m   Wt Readings from Last 3 Encounters:  11/18/23 220 lb (99.8 kg)  04/30/23 225 lb (102.1 kg)  01/26/23 220 lb (99.8 kg)    Physical Exam Vitals and nursing note reviewed.  Constitutional:      General: She is not in acute distress.    Appearance: Normal appearance. She is well-developed. She is not diaphoretic.     Comments: Well-appearing, comfortable, cooperative  HENT:     Head: Normocephalic and atraumatic.  Eyes:     General:        Right eye: No discharge.        Left eye: No discharge.     Conjunctiva/sclera: Conjunctivae normal.  Neck:     Thyroid : No thyromegaly.  Cardiovascular:     Rate and Rhythm: Normal rate and regular rhythm.     Heart sounds: Normal heart sounds. No murmur heard. Pulmonary:     Effort: Pulmonary effort is normal. No respiratory distress.     Breath sounds: Normal breath sounds. No wheezing or rales.     Comments: cough Musculoskeletal:        General: Normal range of motion.      Cervical back: Normal range of motion and neck supple.  Lymphadenopathy:     Cervical: No cervical adenopathy.  Skin:    General: Skin is warm and dry.     Findings: No erythema or rash.  Neurological:     Mental Status: She is alert and oriented to person, place, and time.  Psychiatric:        Mood and Affect: Mood normal.        Behavior: Behavior normal.        Thought Content: Thought content normal.     Comments: Well groomed, good eye contact, normal speech and thoughts     Results for orders placed or performed in visit on 11/18/23  POC Covid19/Flu A&B Antigen   Collection Time: 11/18/23 10:33 AM  Result Value Ref Range   Influenza A Antigen, POC Positive (A) Negative   Influenza B Antigen, POC Negative Negative   Covid Antigen, POC Negative Negative      Assessment & Plan:   Problem List Items Addressed This Visit   None Visit Diagnoses       Influenza A    -  Primary   Relevant Medications   oseltamivir  (TAMIFLU ) 75 MG capsule   guaiFENesin -codeine  (VIRTUSSIN A/C) 100-10 MG/5ML syrup   albuterol  (VENTOLIN  HFA) 108 (90 Base) MCG/ACT inhaler   predniSONE  (DELTASONE ) 10 MG tablet   Other Relevant Orders   POC Covid19/Flu A&B Antigen (Completed)         Influenza A Onset yesterday with persistent coughing, fever, and chills. No improvement with over-the-counter treatments. -Start Tamiflu  for 5 days, twice daily. -Add Tylenol  for fever management. -Consider starting steroid taper if symptoms persist or worsen. Rx sent. May use it or hold it up to patient.  Cough Persistent, associated with flu symptoms. -Prescribe cough syrup with codeine . Cautioned about potential interaction with fluoxetine . -Provide rescue inhaler for symptom management.  Work Print[employer] for illness-related absence. -Provide work excuse for 11/17/2023 and 11/18/2023, with return to work on next regularly scheduled shift on 11/21/2023, provided fever-free and  improved.  Follow-up Monitor for potential post-viral complications such as pneumonia or bronchitis. -Advise patient to return if symptoms worsen or do not improve.  Orders Placed This Encounter  Procedures   POC Covid19/Flu A&B Antigen    Meds ordered this encounter  Medications   oseltamivir  (TAMIFLU ) 75 MG capsule    Sig: Take 1 capsule (75 mg total) by mouth 2 (two) times daily. For 5 days    Dispense:  10 capsule    Refill:  0   guaiFENesin -codeine  (VIRTUSSIN A/C) 100-10 MG/5ML syrup    Sig: Take 5 mLs by mouth 3 (three) times daily as needed for cough.    Dispense:  120 mL    Refill:  0   albuterol  (VENTOLIN  HFA) 108 (90 Base) MCG/ACT inhaler    Sig: Inhale 2 puffs into the lungs every 6 (six) hours as needed for wheezing or shortness of breath.    Dispense:  8 g    Refill:  2   predniSONE  (DELTASONE ) 10 MG tablet    Sig: Take 6 tabs with breakfast Day 1, 5 tabs Day 2, 4 tabs Day 3, 3 tabs Day 4, 2 tabs Day 5, 1 tab Day 6.    Dispense:  21 tablet    Refill:  0    Follow up plan: Return if symptoms worsen or fail to improve.   Marsa Officer, DO [facility] 11/18/2023, 10:32 AM

## 2023-11-18 NOTE — Patient Instructions (Addendum)
 Thank you for coming to the office today.   1. You tested positive for Influenza A today (rapid flu swab test in office)  - Start Flu medicine today Tamiflu  (anti-flu medicine) take one capsule 75mg  twice a day for 5 days   - For symptom control (these are optional OTC medicines)      - Take Ibuprofen  / Advil  400-600mg  every 6-8 hours as needed for fever / muscle aches, and may also take Tylenol  500-1000mg  per dose every 6-8 hours or 3 times a day, can alternate dosing     - May try OTC Mucinex  up to 7-10 days then stop  If prescribed for you      - Start Tessalon  perls one every 8 hours or 3 times a day as needed for cough      - Start Atrovent nasal spray decongestant 2 sprays in each nostril up to 4 times daily for 7 days      - Consider Prednisone , Albuterol  if need  - Wash hands and cover cough very well to avoid spread of infection - Improve hydration with plenty of clear fluids    If significant worsening with poor fluid intake, worsening fever, difficulty breathing due to coughing, worsening body aches, weakness, or other more concerning symptoms difficulty breathing you can seek treatment at Emergency Department. Also if improved flu symptoms and then worsening days to week later with concerns for bronchitis, productive cough fever chills again we may need to check for possible pneumonia that can occur after the flu    Please schedule a Follow-up Appointment to: Return if symptoms worsen or fail to improve.  If you have any other questions or concerns, please feel free to call the office or send a message through MyChart. You may also schedule an earlier appointment if necessary.  Additionally, you may be receiving a survey about your experience at our office within a few days to 1 week by e-mail or mail. We value your feedback.  Marsa Officer, DO [facility], [place]

## 2023-11-20 ENCOUNTER — Encounter: Payer: Self-pay | Admitting: Family Medicine

## 2023-11-24 ENCOUNTER — Encounter: Payer: Self-pay | Admitting: Family Medicine

## 2023-11-24 DIAGNOSIS — M545 Low back pain, unspecified: Secondary | ICD-10-CM

## 2023-11-24 MED ORDER — CYCLOBENZAPRINE HCL 10 MG PO TABS
10.0000 mg | ORAL_TABLET | Freq: Three times a day (TID) | ORAL | 0 refills | Status: DC | PRN
Start: 2023-11-24 — End: 2024-01-21

## 2023-11-25 MED ORDER — GABAPENTIN 300 MG PO CAPS
300.0000 mg | ORAL_CAPSULE | Freq: Three times a day (TID) | ORAL | 0 refills | Status: DC
Start: 2023-11-25 — End: 2024-01-21

## 2023-11-25 NOTE — Addendum Note (Signed)
Addended by: Smitty Cords on: 11/25/2023 03:36 PM   Modules accepted: Orders

## 2024-01-21 ENCOUNTER — Ambulatory Visit (INDEPENDENT_AMBULATORY_CARE_PROVIDER_SITE_OTHER): Admitting: Internal Medicine

## 2024-01-21 ENCOUNTER — Encounter: Payer: Self-pay | Admitting: Internal Medicine

## 2024-01-21 VITALS — BP 132/82 | Ht 66.0 in | Wt 220.4 lb

## 2024-01-21 DIAGNOSIS — E782 Mixed hyperlipidemia: Secondary | ICD-10-CM | POA: Diagnosis not present

## 2024-01-21 DIAGNOSIS — E66812 Obesity, class 2: Secondary | ICD-10-CM | POA: Diagnosis not present

## 2024-01-21 DIAGNOSIS — R739 Hyperglycemia, unspecified: Secondary | ICD-10-CM | POA: Diagnosis not present

## 2024-01-21 DIAGNOSIS — Z0001 Encounter for general adult medical examination with abnormal findings: Secondary | ICD-10-CM | POA: Diagnosis not present

## 2024-01-21 DIAGNOSIS — Z6835 Body mass index (BMI) 35.0-35.9, adult: Secondary | ICD-10-CM

## 2024-01-21 DIAGNOSIS — Z23 Encounter for immunization: Secondary | ICD-10-CM

## 2024-01-21 MED ORDER — FLUOXETINE HCL 20 MG PO CAPS: 20.0000 mg | ORAL_CAPSULE | Freq: Every day | ORAL | 1 refills | Status: DC

## 2024-01-21 MED ORDER — LISINOPRIL 5 MG PO TABS: 5.0000 mg | ORAL_TABLET | Freq: Every day | ORAL | 1 refills | Status: DC

## 2024-01-21 NOTE — Addendum Note (Signed)
 Addended by: Luanna Cole D on: 01/21/2024 03:21 PM   Modules accepted: Orders

## 2024-01-21 NOTE — Progress Notes (Signed)
 Subjective:    Patient ID: Stacey Huynh, female    DOB: Mar 17, 1986, 38 y.o.   MRN: [id number]  HPI  Pt presents to the clinic today for her annual exam.  Flu: 10/2018 Tetanus: 12/2012 Covid: never Pap smear: hysterectomy Dentist: biannually  Diet: She does eat meat. She consumes fruits and veggies. She does eat some fried foods. She drinks mostly soda and water. Exercise: None  Review of Systems     Past Medical History:  Diagnosis Date   Abnormal Pap smear    Abnormal Pap smear of cervix    Anxiety    Hypertension     Current Outpatient Medications  Medication Sig Dispense Refill   albuterol (VENTOLIN HFA) 108 (90 Base) MCG/ACT inhaler Inhale 2 puffs into the lungs every 6 (six) hours as needed for wheezing or shortness of breath. 8 g 2   cyclobenzaprine (FLEXERIL) 10 MG tablet Take 1 tablet (10 mg total) by mouth 3 (three) times daily as needed for muscle spasms. 30 tablet 0   FLUoxetine (PROZAC) 20 MG capsule Take 1 capsule (20 mg total) by mouth daily. 90 capsule 1   gabapentin (NEURONTIN) 300 MG capsule Take 1 capsule (300 mg total) by mouth 3 (three) times daily. Start with 1 capsule nightly, gradually increase daily dose to 2 capsules then eventually 3 capsules over course of 24 hours if tolerated. 90 capsule 0   guaiFENesin-codeine (VIRTUSSIN A/C) 100-10 MG/5ML syrup Take 5 mLs by mouth 3 (three) times daily as needed for cough. 120 mL 0   ibuprofen (ADVIL) 200 MG tablet Take 800 mg by mouth daily as needed for mild pain.     lisinopril (ZESTRIL) 5 MG tablet Take 1 tablet (5 mg total) by mouth daily. 90 tablet 1   lovastatin (MEVACOR) 10 MG tablet TAKE 1 TABLET BY MOUTH EVERYDAY AT BEDTIME 90 tablet 0   oseltamivir (TAMIFLU) 75 MG capsule Take 1 capsule (75 mg total) by mouth 2 (two) times daily. For 5 days 10 capsule 0   predniSONE (DELTASONE) 10 MG tablet Take 6 tabs with breakfast Day 1, 5 tabs Day 2, 4 tabs Day 3, 3 tabs Day 4, 2 tabs Day 5, 1 tab Day 6. 21  tablet 0   No current facility-administered medications for this visit.    Allergies  Allergen Reactions   Penicillins Other (See Comments)    Did it involve swelling of the face/tongue/throat, SOB, or low BP? Unknown Did it involve sudden or severe rash/hives, skin peeling, or any reaction on the inside of your mouth or nose? Unknown Did you need to seek medical attention at a hospital or doctor's office? Unknown When did it last happen? Childhood allergy If all above answers are "NO", may proceed with cephalosporin use.     Penicillin G     Other Reaction(s): Not available    Family History  Problem Relation Age of Onset   Pancreatic cancer Paternal Aunt    Bone cancer Maternal Grandmother    Lung cancer Maternal Grandmother    Colon cancer Paternal Grandfather    Depression Mother    Depression Father    Suicidality Father     Social History   Socioeconomic History   Marital status: Married    Spouse name: Not on file   Number of children: Not on file   Years of education: Not on file   Highest education level: Not on file  Occupational History   Not on file  Tobacco Use  Smoking status: Never   Smokeless tobacco: Never  Vaping Use   Vaping status: Never Used  Substance and Sexual Activity   Alcohol use: Not Currently    Comment: rare   Drug use: No   Sexual activity: Yes    Birth control/protection: Surgical    Comment: Hysterectomy  Other Topics Concern   Not on file  Social History Narrative   Not on file   Social Drivers of Health   Financial Resource Strain: Not on file  Food Insecurity: No Food Insecurity (01/26/2023)   Hunger Vital Sign    Worried About Running Out of Food in the Last Year: Never true    Ran Out of Food in the Last Year: Never true  Transportation Needs: No Transportation Needs (01/26/2023)   PRAPARE - Administrator, Civil Service (Medical): No    Lack of Transportation (Non-Medical): No  Physical Activity: Not  on file  Stress: Not on file  Social Connections: Not on file  Intimate Partner Violence: Not At Risk (01/26/2023)   Humiliation, Afraid, Rape, and Kick questionnaire    Fear of Current or Ex-Partner: No    Emotionally Abused: No    Physically Abused: No    Sexually Abused: No     Constitutional: Denies fever, malaise, fatigue, headache or abrupt weight changes.  HEENT: Denies eye pain, eye redness, ear pain, ringing in the ears, wax buildup, runny nose, nasal congestion, bloody nose, or sore throat. Respiratory: Denies difficulty breathing, shortness of breath, cough or sputum production.   Cardiovascular: Denies chest pain, chest tightness, palpitations or swelling in the hands or feet.  Gastrointestinal: Denies abdominal pain, bloating, constipation, diarrhea or blood in the stool.  GU: Denies urgency, frequency, pain with urination, burning sensation, blood in urine, odor or discharge. Musculoskeletal: Denies decrease in range of motion, difficulty with gait, muscle pain or joint pain and swelling.  Skin: Denies redness, rashes, lesions or ulcercations.  Neurological: Denies dizziness, difficulty with memory, difficulty with speech or problems with balance and coordination.  Psych: Pt has a history of anxiety. Denies depression, SI/HI.  No other specific complaints in a complete review of systems (except as listed in HPI above).  Objective:   Physical Exam  BP 132/82 (BP Location: Left Arm, Patient Position: Sitting, Cuff Size: Normal)   Ht 5\' 6"  (1.676 m)   Wt 220 lb 6.4 oz (100 kg)   LMP 11/09/2019   BMI 35.57 kg/m    Wt Readings from Last 3 Encounters:  11/18/23 220 lb (99.8 kg)  04/30/23 225 lb (102.1 kg)  01/26/23 220 lb (99.8 kg)    General: Appears her stated age, obese, in NAD. Skin: Warm, dry and intact.   HEENT: Head: normal shape and size; Eyes: sclera white, no icterus, conjunctiva pink, PERRLA and EOMs intact;  Neck:  Neck supple, trachea midline. No  masses, lumps or thyromegaly present.  Cardiovascular: Normal rate and rhythm. S1,S2 noted.  No murmur, rubs or gallops noted. No JVD or BLE edema.  Pulmonary/Chest: Normal effort and positive vesicular breath sounds. No respiratory distress. No wheezes, rales or ronchi noted.  Abdomen: Normal bowel sound Musculoskeletal: Strength 5/5 BUE/BLE.  No difficulty with gait.  Neurological: Alert and oriented. Cranial nerves II-XII grossly intact. Coordination normal.  Psychiatric: Mood and affect normal. Behavior is normal. Judgment and thought content normal.   BMET    Component Value Date/Time   NA 136 01/25/2023 1829   NA 139 05/12/2017 0000   K  3.9 01/25/2023 1829   CL 104 01/25/2023 1829   CO2 24 01/25/2023 1829   GLUCOSE 135 (H) 01/25/2023 1829   BUN 12 01/25/2023 1829   BUN 9 05/12/2017 0000   CREATININE 0.77 01/25/2023 1829   CREATININE 0.72 12/11/2022 1502   CALCIUM 9.4 01/25/2023 1829   GFRNONAA >60 01/25/2023 1829   GFRNONAA 82 06/06/2020 1618   GFRAA 95 06/06/2020 1618    Lipid Panel     Component Value Date/Time   CHOL 177 12/11/2022 1502   CHOL 193 05/12/2017 0000   TRIG 146 12/11/2022 1502   HDL 47 (L) 12/11/2022 1502   HDL 40 05/12/2017 0000   CHOLHDL 3.8 12/11/2022 1502   LDLCALC 104 (H) 12/11/2022 1502    CBC    Component Value Date/Time   WBC 10.6 (H) 01/25/2023 1829   RBC 4.72 01/25/2023 1829   HGB 14.3 01/25/2023 1829   HGB 14.4 05/12/2017 0000   HCT 43.4 01/25/2023 1829   HCT 41.5 05/12/2017 0000   PLT 347 01/25/2023 1829   PLT 273 05/12/2017 0000   MCV 91.9 01/25/2023 1829   MCV 89 05/12/2017 0000   MCH 30.3 01/25/2023 1829   MCHC 32.9 01/25/2023 1829   RDW 12.3 01/25/2023 1829   RDW 13.3 05/12/2017 0000   LYMPHSABS 1,883 06/06/2020 1618   MONOABS 0.6 04/17/2019 1925   EOSABS 172 06/06/2020 1618   BASOSABS 52 06/06/2020 1618    Hgb A1C Lab Results  Component Value Date   HGBA1C 5.6 12/11/2022           Assessment & Plan:    Preventative Health Maintenance:  Encouraged her to get a flu shot in the fall Tetanus today Encouraged her to get a COVID-vaccine She no longer needs Pap smears Encouraged her to consume a balanced diet and exercise regimen Advised her to see an eye doctor and dentist annually We will check CBC, c-Met, lipid and A1c today  RTC in 6 months, follow-up chronic conditions Nicki Reaper, NP

## 2024-01-21 NOTE — Assessment & Plan Note (Signed)
 Encouraged diet and exercise for weight loss ?

## 2024-01-21 NOTE — Patient Instructions (Signed)

## 2024-01-22 ENCOUNTER — Encounter: Payer: Self-pay | Admitting: Internal Medicine

## 2024-01-22 LAB — COMPREHENSIVE METABOLIC PANEL WITH GFR
AG Ratio: 1.6 (calc) (ref 1.0–2.5)
ALT: 16 U/L (ref 6–29)
AST: 13 U/L (ref 10–30)
Albumin: 4.1 g/dL (ref 3.6–5.1)
Alkaline phosphatase (APISO): 62 U/L (ref 31–125)
BUN: 9 mg/dL (ref 7–25)
CO2: 28 mmol/L (ref 20–32)
Calcium: 8.9 mg/dL (ref 8.6–10.2)
Chloride: 104 mmol/L (ref 98–110)
Creat: 0.71 mg/dL (ref 0.50–0.97)
Globulin: 2.5 g/dL (ref 1.9–3.7)
Glucose, Bld: 90 mg/dL (ref 65–139)
Potassium: 4.2 mmol/L (ref 3.5–5.3)
Sodium: 138 mmol/L (ref 135–146)
Total Bilirubin: 0.4 mg/dL (ref 0.2–1.2)
Total Protein: 6.6 g/dL (ref 6.1–8.1)
eGFR: 111 mL/min/{1.73_m2} (ref >=60)

## 2024-01-22 LAB — CBC
HCT: 40.5 % (ref 35.0–45.0)
Hemoglobin: 13.6 g/dL (ref 11.7–15.5)
MCH: 30.5 pg (ref 27.0–33.0)
MCHC: 33.6 g/dL (ref 32.0–36.0)
MCV: 90.8 fL (ref 80.0–100.0)
MPV: 10.5 fL (ref 7.5–12.5)
Platelets: 343 10*3/uL (ref 140–400)
RBC: 4.46 10*6/uL (ref 3.80–5.10)
RDW: 12.1 % (ref 11.0–15.0)
WBC: 7.9 10*3/uL (ref 3.8–10.8)

## 2024-01-22 LAB — HEMOGLOBIN A1C
Hgb A1c MFr Bld: 5.4 %{Hb} (ref <5.7)
Mean Plasma Glucose: 108 mg/dL
eAG (mmol/L): 6 mmol/L

## 2024-01-22 LAB — LIPID PANEL
Cholesterol: 174 mg/dL (ref <200)
HDL: 47 mg/dL — ABNORMAL LOW (ref >=50)
LDL Cholesterol (Calc): 102 mg/dL — ABNORMAL HIGH
Non-HDL Cholesterol (Calc): 127 mg/dL (ref <130)
Total CHOL/HDL Ratio: 3.7 (calc) (ref <5.0)
Triglycerides: 155 mg/dL — ABNORMAL HIGH (ref <150)

## 2024-01-24 MED ORDER — LOVASTATIN 20 MG PO TABS: 20.0000 mg | ORAL_TABLET | Freq: Every day | ORAL | 1 refills | Status: DC

## 2024-03-05 ENCOUNTER — Encounter: Payer: Self-pay | Admitting: Internal Medicine

## 2024-03-09 ENCOUNTER — Encounter: Payer: Self-pay | Admitting: Internal Medicine

## 2024-03-09 ENCOUNTER — Ambulatory Visit (INDEPENDENT_AMBULATORY_CARE_PROVIDER_SITE_OTHER): Admitting: Internal Medicine

## 2024-03-09 VITALS — BP 124/84 | Ht 66.0 in | Wt 219.1 lb

## 2024-03-09 DIAGNOSIS — B029 Zoster without complications: Secondary | ICD-10-CM

## 2024-03-09 MED ORDER — ACYCLOVIR 5 % EX OINT: 1.0000 | TOPICAL_OINTMENT | CUTANEOUS | 0 refills | Status: AC

## 2024-03-09 NOTE — Progress Notes (Signed)
 Subjective:    Patient ID: Stacey Huynh, female    DOB: 1986-09-29, 38 y.o.   MRN: [id number]  HPI  Discussed the use of AI scribe software for clinical note transcription with the patient, who gave verbal consent to proceed.  Stacey Huynh is a 38 year old female who presents with a rash under her right breast.  She has had a rash under her right breast for the past two weeks. The rash is itchy, flat, and persistent in the same spot without any drainage or odor. She has experienced similar issues in the past, which were managed with baby dusting powder, but this current rash appears different.  She has applied hydrocortisone to the rash without relief. Additionally, she has noticed a new spot developing on the same side.    Review of Systems   Past Medical History:  Diagnosis Date   Abnormal Pap smear    Abnormal Pap smear of cervix    Anxiety    Hypertension     Current Outpatient Medications  Medication Sig Dispense Refill   FLUoxetine  (PROZAC ) 20 MG capsule Take 1 capsule (20 mg total) by mouth daily. 90 capsule 1   ibuprofen  (ADVIL ) 200 MG tablet Take 800 mg by mouth daily as needed for mild pain.     lisinopril  (ZESTRIL ) 5 MG tablet Take 1 tablet (5 mg total) by mouth daily. 90 tablet 1   lovastatin  (MEVACOR ) 20 MG tablet Take 1 tablet (20 mg total) by mouth at bedtime. 90 tablet 1   No current facility-administered medications for this visit.    Allergies  Allergen Reactions   Penicillins Other (See Comments)    Did it involve swelling of the face/tongue/throat, SOB, or low BP? Unknown Did it involve sudden or severe rash/hives, skin peeling, or any reaction on the inside of your mouth or nose? Unknown Did you need to seek medical attention at a hospital or doctor's office? Unknown When did it last happen? Childhood allergy If all above answers are "NO", may proceed with cephalosporin use.     Penicillin G     Other Reaction(s): Not available     Family History  Problem Relation Age of Onset   Pancreatic cancer Paternal Aunt    Bone cancer Maternal Grandmother    Lung cancer Maternal Grandmother    Colon cancer Paternal Grandfather    Depression Mother    Depression Father    Suicidality Father     Social History   Socioeconomic History   Marital status: Married    Spouse name: Not on file   Number of children: Not on file   Years of education: Not on file   Highest education level: Not on file  Occupational History   Not on file  Tobacco Use   Smoking status: Never   Smokeless tobacco: Never  Vaping Use   Vaping status: Never Used  Substance and Sexual Activity   Alcohol use: Not Currently    Comment: rare   Drug use: No   Sexual activity: Yes    Birth control/protection: Surgical    Comment: Hysterectomy  Other Topics Concern   Not on file  Social History Narrative   Not on file   Social Drivers of Health   Financial Resource Strain: Not on file  Food Insecurity: No Food Insecurity (01/26/2023)   Hunger Vital Sign    Worried About Running Out of Food in the Last Year: Never true    Ran Out of  Food in the Last Year: Never true  Transportation Needs: No Transportation Needs (01/26/2023)   PRAPARE - Administrator, Civil Service (Medical): No    Lack of Transportation (Non-Medical): No  Physical Activity: Not on file  Stress: Not on file  Social Connections: Not on file  Intimate Partner Violence: Not At Risk (01/26/2023)   Humiliation, Afraid, Rape, and Kick questionnaire    Fear of Current or Ex-Partner: No    Emotionally Abused: No    Physically Abused: No    Sexually Abused: No     Constitutional: Denies fever, malaise, fatigue, headache or abrupt weight changes.  Respiratory: Denies difficulty breathing, shortness of breath, cough or sputum production.   Cardiovascular: Denies chest pain, chest tightness, palpitations or swelling in the hands or feet.  Skin: Patient reports  rash underneath right breast.  Denies lesions or ulcercations.    No other specific complaints in a complete review of systems (except as listed in HPI above).      Objective:   Physical Exam  LMP 11/09/2019  Wt Readings from Last 3 Encounters:  01/21/24 220 lb 6.4 oz (100 kg)  11/18/23 220 lb (99.8 kg)  04/30/23 225 lb (102.1 kg)    General: Appears her stated age, obese, in NAD. Skin: Grouped, dried up maculopapular lesions noted underneath the right breast.  Cardiovascular: Normal rate and rhythm.  Pulmonary/Chest: Normal effort and positive vesicular breath sounds. No respiratory distress.  Musculoskeletal: No difficulty with gait.  Neurological: Alert and oriented.   BMET    Component Value Date/Time   NA 138 01/21/2024 1515   NA 139 05/12/2017 0000   K 4.2 01/21/2024 1515   CL 104 01/21/2024 1515   CO2 28 01/21/2024 1515   GLUCOSE 90 01/21/2024 1515   BUN 9 01/21/2024 1515   BUN 9 05/12/2017 0000   CREATININE 0.71 01/21/2024 1515   CALCIUM 8.9 01/21/2024 1515   GFRNONAA >60 01/25/2023 1829   GFRNONAA 82 06/06/2020 1618   GFRAA 95 06/06/2020 1618    Lipid Panel     Component Value Date/Time   CHOL 174 01/21/2024 1515   CHOL 193 05/12/2017 0000   TRIG 155 (H) 01/21/2024 1515   HDL 47 (L) 01/21/2024 1515   HDL 40 05/12/2017 0000   CHOLHDL 3.7 01/21/2024 1515   LDLCALC 102 (H) 01/21/2024 1515    CBC    Component Value Date/Time   WBC 7.9 01/21/2024 1515   RBC 4.46 01/21/2024 1515   HGB 13.6 01/21/2024 1515   HGB 14.4 05/12/2017 0000   HCT 40.5 01/21/2024 1515   HCT 41.5 05/12/2017 0000   PLT 343 01/21/2024 1515   PLT 273 05/12/2017 0000   MCV 90.8 01/21/2024 1515   MCV 89 05/12/2017 0000   MCH 30.5 01/21/2024 1515   MCHC 33.6 01/21/2024 1515   RDW 12.1 01/21/2024 1515   RDW 13.3 05/12/2017 0000   LYMPHSABS 1,883 06/06/2020 1618   MONOABS 0.6 04/17/2019 1925   EOSABS 172 06/06/2020 1618   BASOSABS 52 06/06/2020 1618    Hgb A1C Lab  Results  Component Value Date   HGBA1C 5.4 01/21/2024            Assessment & Plan:  Assessment and Plan    Shingles Rash under right breast consistent with shingles, improving. Oral antivirals not indicated. Advised on transmission precautions. - Prescribe Aciclovir ointment every three hours. - Advise hydrocortisone cream if needed. - Monitor rash for improvement, report worsening.  RTC in 5 months for follow-up of chronic conditions Helayne Lo, NP

## 2024-03-09 NOTE — Patient Instructions (Signed)
 Shingles  Shingles, or herpes zoster, is an infection. It gives you a skin rash and blisters. These infected areas may hurt a lot. Shingles only happens if: You've had chickenpox. You've been given a shot called a vaccine to protect you from getting chickenpox. Shingles is rare in this case. What are the causes? Shingles is caused by a germ called the varicella-zoster virus. This is the same germ that causes chickenpox. After you're exposed to the germ, it stays in your body but is dormant. This means it isn't active. Shingles happens if the germ becomes active again. This can happen years after you're first exposed to the germ. What increases the risk? You may be more likely to get shingles if: You're older than 39 years of age. You're under a lot of stress. You have a weak immune system. The immune system is your body's defense system. It may be weak if: You have human immunodeficiency virus (HIV). You have acquired immunodeficiency syndrome (AIDS). You have cancer. You take medicines that weaken your immune system. These include organ transplant medicines. What are the signs or symptoms? The first symptoms of shingles may be itching, tingling, or pain. Your skin may feel like it's burning. A few days or weeks later, you'll get a rash. Here's what you can expect: The rash is likely to be on one side of your body. The rash may be shaped like a belt or a band. Over time, it will turn into blisters filled with fluid. The blisters will break open and change into scabs. The scabs will dry up in about 2-3 weeks. You may also have: A fever. Chills. A headache. Nausea. How is this diagnosed? Shingles is diagnosed with a skin exam. A sample called a culture may be taken from one of your blisters and sent to a lab. This will show if you have shingles. How is this treated? The rash may last for several weeks. There's no cure for shingles, but your health care provider may give you medicines.  These medicines may: Help with pain. Help with itching. Help with irritation and swelling. Help you get better sooner. Help to prevent long-term problems. If the rash is on your face, you may need to see an eye doctor or an ear, nose, and throat (ENT) doctor. Follow these instructions at home: Medicines Take your medicines only as told by your provider. Put an anti-itch cream or numbing cream on the rash or blisters as told by your provider. Relieving itching and discomfort  To help with itching: Put cold, wet cloths called cold compresses on the rash or blisters. Take a cool bath. Try adding baking soda or dry oatmeal to the water. Do not bathe in hot water. Use calamine lotion on the rash or blisters. You can get this type of lotion at the store. Blister and rash care Keep your rash covered with a loose bandage. Wear loose clothes that don't rub on your rash. Take care of your rash as told by your provider. Make sure you: Wash your hands with soap and water for at least 20 seconds before and after you change your bandage. If you can't use soap and water, use hand sanitizer. Keep your rash and blisters clean by washing them with mild soap and cool water. Change your bandage. Check your rash every day for signs of infection. Check for: More redness, swelling, or pain. Fluid or blood. Warmth. Pus or a bad smell. Do not scratch your rash. Do not pick at your  blisters. To help you not scratch: Keep your fingernails clean and cut short. Try to wear gloves or mittens when you sleep. General instructions Rest. Wash your hands often with soap and water for at least 20 seconds. If you can't use soap and water, use hand sanitizer. Washing your hands lowers your chance of getting a skin infection. Your infection can cause chickenpox in others. If you have blisters that aren't scabs yet, stay away from: Babies. Pregnant people. Children who have eczema. Older people who have organ  transplants. People who have a long-term, or chronic, illness. Anyone who hasn't had chickenpox before. Anyone who hasn't gotten the chickenpox vaccine. How is this prevented? Vaccines are the best way to prevent you from getting chickenpox or shingles. Talk with your provider about getting these shots. Where to find more information Centers for Disease Control and Prevention (CDC): [employer].no Contact a health care provider if: Your pain doesn't get better with medicine. Your pain doesn't get better after the rash heals. You have any signs of infection around the rash. Your rash or blisters get worse. You have a fever or chills. Get help right away if: The rash is on your face or nose. You have pain in your face or by your eye. You lose feeling on one side of your face. You have trouble seeing. You have ear pain or ringing in your ear. This information is not intended to replace advice given to you by your health care provider. Make sure you discuss any questions you have with your health care provider. Document Revised: 07/01/2023 Document Reviewed: 11/13/2022 Elsevier Patient Education  2024 [employer].

## 2024-03-09 NOTE — Telephone Encounter (Signed)
 Okay for work note?

## 2024-03-13 ENCOUNTER — Ambulatory Visit: Payer: Self-pay

## 2024-03-13 NOTE — Telephone Encounter (Signed)
 We have been communicating through mychart about this.

## 2024-03-13 NOTE — Telephone Encounter (Signed)
 Copied From CRM [id number].  Reason for Triage: Patient states shingle rash has gotten worse and wanted to inform Helayne Lo, NP   Callback #: [id number]   Preferred Pharmacy:  [facility] [phone] - [place], [place] - [address]  [address] [place] [address]  Phone: [phone] Fax: [phone]  Hours: Not open 24 hours

## 2024-03-13 NOTE — Telephone Encounter (Signed)
Okay for work note to return to work

## 2024-03-13 NOTE — Telephone Encounter (Signed)
 Called pt to speak with patient: no answer: left voicemail for pt and will route information to PCP as well

## 2024-03-13 NOTE — Telephone Encounter (Signed)
 No triage: Pt is requesting PCP to give the "OK" to go to work this afternoon with rash spreading. Pt and PCP have been communicating through Mychart. Please advise Reason for Disposition  Health Information question, no triage required and triager able to answer question  Answer Assessment - Initial Assessment Questions 1. REASON FOR CALL or QUESTION: "What is your reason for calling today?" or "How can I best help you?" or "What question do you have that I can help answer?"     Pt wants to know if ok to go to work with rash spreading.  Protocols used: Information Only Call - No Triage-A-AH

## 2024-04-18 ENCOUNTER — Ambulatory Visit
Admission: EM | Admit: 2024-04-18 | Discharge: 2024-04-18 | Disposition: A | Discharge status: Home / Self Care | Attending: Emergency Medicine | Admitting: Emergency Medicine

## 2024-04-18 ENCOUNTER — Ambulatory Visit: Payer: Self-pay

## 2024-04-18 ENCOUNTER — Encounter: Payer: Self-pay | Admitting: Internal Medicine

## 2024-04-18 DIAGNOSIS — L509 Urticaria, unspecified: Secondary | ICD-10-CM | POA: Diagnosis not present

## 2024-04-18 DIAGNOSIS — T63441A Toxic effect of venom of bees, accidental (unintentional), initial encounter: Secondary | ICD-10-CM

## 2024-04-18 MED ORDER — PREDNISONE 10 MG (21) PO TBPK: ORAL_TABLET | Freq: Every day | ORAL | 0 refills | Status: DC

## 2024-04-18 MED ORDER — EPINEPHRINE 0.3 MG/0.3ML IJ SOAJ: 0.3000 mg | INTRAMUSCULAR | 0 refills | Status: AC | PRN

## 2024-04-18 MED ORDER — DIPHENHYDRAMINE HCL 50 MG PO CAPS
50.0000 mg | ORAL_CAPSULE | Freq: Once | ORAL | Status: AC
  Administered 2024-04-18: 50 mg via ORAL

## 2024-04-18 MED ORDER — FAMOTIDINE 20 MG PO TABS
20.0000 mg | ORAL_TABLET | Freq: Once | ORAL | Status: AC
  Administered 2024-04-18: 20 mg via ORAL

## 2024-04-18 MED ORDER — DEXAMETHASONE SODIUM PHOSPHATE 10 MG/ML IJ SOLN
10.0000 mg | Freq: Once | INTRAMUSCULAR | Status: AC
  Administered 2024-04-18: 10 mg via INTRAMUSCULAR

## 2024-04-18 NOTE — ED Triage Notes (Addendum)
 Patient to Urgent Care with complaints of an allergic reaction to hornet stings that occurred this morning.   Reports multiple stings- one to right wrist and two to right ankle. Areas are red and swollen. Hives to bilateral arms and legs/ chest/ abdomen. Also reports scratchy throat.   No otc meds PTA.

## 2024-04-18 NOTE — Discharge Instructions (Addendum)
 Follow up with your primary care provider tomorrow.  Call 911 and go to the emergency department if you have worsening symptoms.    You were given an injection of a steroid called dexamethasone .  Start the prednisone  taper tomorrow as directed.    Take Zyrtec as directed.    Use the EpiPen  if needed as directed.

## 2024-04-18 NOTE — Telephone Encounter (Signed)
 FYI Only or Action Required?: FYI only for provider.  Patient was last seen in primary care on 03/09/2024 by Antonette Angeline ORN, NP.  Called Nurse Triage reporting Insect Bite.  Symptoms began today.  Interventions attempted: Nothing.  Symptoms are: gradually worsening.  Triage Disposition: See Physician Within 24 Hours  Patient/caregiver understands and will follow disposition?: Yes    Copied from CRM [id number]. Topic: Clinical - Red Word Triage >> Apr 18, 2024  9:55 AM Tonda B wrote: [facility] that prompted transfer to Nurse Triage: patient is having an allergic reaction to being stung by hornets pt harm is swollen Reason for Disposition  Swelling is huge (e.g., more than 4 inches or 10 cm, spreads beyond wrist or ankle)  Answer Assessment - Initial Assessment Questions 1. TYPE: What type of sting was it? (bee, yellow jacket, etc.)      Hornet 2. ONSET: When did it occur?      Just now  3. LOCATION: Where is the sting located?  How many stings?     Right arm and Right Ankle  4. SWELLING SIZE: How big is the swelling? (e.g., inches or cm)     Hives   5. REDNESS: Is the area red or pink? If Yes, ask: What size is area of redness? (e.g., inches or cm). When did the redness start?     Reddened  6. PAIN: Is there any pain? If Yes, ask: How bad is it?  (Scale 1-10; or mild, moderate, severe)     Moderate to severe pain  7. ITCHING: Is there any itching? If Yes, ask: How bad is it?      Extremely itchy  8. RESPIRATORY DISTRESS: Describe your breathing.     Fine at the moment  9. PRIOR REACTIONS: Have you had any severe allergic reactions to stings in the past? if yes, ask: What happened?     Not really  10. OTHER SYMPTOMS: Do you have any other symptoms? (e.g., abdomen pain, face or tongue swelling, new rash elsewhere, vomiting)       No other symptoms at this time  11. PREGNANCY: Is there any chance you are pregnant? When was your last  menstrual period?       No  Protocols used: Bee or Yellow Jacket Sting-A-AH

## 2024-04-18 NOTE — ED Provider Notes (Signed)
 Stacey Huynh    CSN: [id number] Arrival date & time: 04/18/24  1025      History   Chief Complaint Chief Complaint  Patient presents with   Allergic Reaction    HPI Stacey Huynh is a 38 y.o. female.  Patient presents with hives after she was stung by hornets this morning.  1 sting on her right wrist and 2 on her right ankle.  No difficulty swallowing or breathing but her throat feels scratchy and she has occasional dry cough.  No OTC medications taken.  No history of allergy to bee stings.  The history is provided by the patient and medical records.    Past Medical History:  Diagnosis Date   Abnormal Pap smear    Abnormal Pap smear of cervix    Anxiety    Hypertension     Patient Active Problem List   Diagnosis Date Noted   Mixed hyperlipidemia 12/11/2022   HTN (hypertension) 12/11/2022   Class 2 obesity due to excess calories with body mass index (BMI) of 35.0 to 35.9 in adult 04/30/2022   Anxiety 02/19/2020    Past Surgical History:  Procedure Laterality Date   CYSTOSCOPY N/A 01/25/2020   Procedure: CYSTOSCOPY;  Surgeon: Lake Read, MD;  Location: [facility];  Service: Gynecology;  Laterality: N/A;   LAPAROSCOPIC OVARIAN CYSTECTOMY Right 04/13/2019   Procedure: LAPAROSCOPIC RIGHT OVARIAN CYSTECTOMY and removal of right paratubal cyst;  Surgeon: Lake Read, MD;  Location: [facility];  Service: Gynecology;  Laterality: Right;   LEEP     TOTAL LAPAROSCOPIC HYSTERECTOMY WITH SALPINGECTOMY N/A 01/25/2020   Procedure: TOTAL LAPAROSCOPIC HYSTERECTOMY WITH BILATERAL SALPINGECTOMY;  Surgeon: Lake Read, MD;  Location: [facility];  Service: Gynecology;  Laterality: N/A;   WISDOM TOOTH EXTRACTION      OB History     Gravida  1   Para  1   Term  1   Preterm      AB      Living  1      SAB      IAB      Ectopic      Multiple      Live Births  1            Home Medications    Prior to Admission medications   Medication  Sig Start Date End Date Taking? Authorizing Provider  EPINEPHrine  0.3 mg/0.3 mL IJ SOAJ injection Inject 0.3 mg into the muscle as needed for anaphylaxis. 04/18/24  Yes Corlis Burnard VEAR, NP  predniSONE  (STERAPRED UNI-PAK 21 TAB) 10 MG (21) TBPK tablet Take by mouth daily. As directed 04/19/24  Yes Corlis Burnard VEAR, NP  acyclovir  ointment (ZOVIRAX ) 5 % Apply 1 Application topically every 3 (three) hours. 03/09/24   Antonette Angeline ORN, NP  FLUoxetine  (PROZAC ) 20 MG capsule Take 1 capsule (20 mg total) by mouth daily. 01/21/24   Antonette Angeline ORN, NP  ibuprofen  (ADVIL ) 200 MG tablet Take 800 mg by mouth daily as needed for mild pain.    [provider]  lisinopril  (ZESTRIL ) 5 MG tablet Take 1 tablet (5 mg total) by mouth daily. 01/21/24   Antonette Angeline ORN, NP  lovastatin  (MEVACOR ) 20 MG tablet Take 1 tablet (20 mg total) by mouth at bedtime. 01/24/24   Antonette Angeline ORN, NP    Family History Family History  Problem Relation Age of Onset   Pancreatic cancer Paternal Aunt    Bone cancer Maternal Grandmother    Lung  cancer Maternal Grandmother    Colon cancer Paternal Grandfather    Depression Mother    Depression Father    Suicidality Father     Social History Social History   Tobacco Use   Smoking status: Never   Smokeless tobacco: Never  Vaping Use   Vaping status: Never Used  Substance Use Topics   Alcohol use: Not Currently    Comment: rare   Drug use: No     Allergies   Penicillins and Penicillin g   Review of Systems Review of Systems  HENT:  Negative for sore throat, trouble swallowing and voice change.   Respiratory:  Positive for cough. Negative for shortness of breath.   Cardiovascular:  Negative for chest pain and palpitations.  Skin:  Positive for color change and rash.  Neurological:  Negative for dizziness, weakness, light-headedness and numbness.     Physical Exam Triage Vital Signs ED Triage Vitals [04/18/24 1031]  Encounter Vitals Group     BP (!) 148/90      Girls Systolic BP Percentile      Girls Diastolic BP Percentile      Boys Systolic BP Percentile      Boys Diastolic BP Percentile      Pulse Rate (!) 105     Resp 18     Temp 97.7 F (36.5 C)     Temp src      SpO2 99 %     Weight      Height      Head Circumference      Peak Flow      Pain Score      Pain Loc      Pain Education      Exclude from Growth Chart    No data found.  Updated Vital Signs BP (!) 148/90   Pulse (!) 105   Temp 97.7 F (36.5 C)   Resp 18   LMP 11/09/2019   SpO2 99%   Visual Acuity Right Eye Distance:   Left Eye Distance:   Bilateral Distance:    Right Eye Near:   Left Eye Near:    Bilateral Near:     Physical Exam Constitutional:      General: She is not in acute distress. HENT:     Mouth/Throat:     Mouth: Mucous membranes are moist.     Pharynx: Oropharynx is clear.  Cardiovascular:     Rate and Rhythm: Normal rate and regular rhythm.     Heart sounds: Normal heart sounds.  Pulmonary:     Effort: Pulmonary effort is normal. No respiratory distress.     Breath sounds: Normal breath sounds.  Skin:    General: Skin is warm and dry.     Findings: Rash present.     Comments: Hives on trunk and extremities.  Neurological:     General: No focal deficit present.     Mental Status: She is alert and oriented to person, place, and time.     Sensory: No sensory deficit.     Motor: No weakness.     Gait: Gait normal.      UC Treatments / Results  Labs (all labs ordered are listed, but only abnormal results are displayed) Labs Reviewed - No data to display  EKG   Radiology No results found.  Procedures Procedures (including critical care time)  Medications Ordered in UC Medications  dexamethasone  (DECADRON ) injection 10 mg (10 mg Intramuscular Given 04/18/24 1102)  diphenhydrAMINE  (  BENADRYL ) capsule 50 mg (50 mg Oral Given 04/18/24 1106)  famotidine  (PEPCID ) tablet 20 mg (20 mg Oral Given 04/18/24 1106)    Initial  Impression / Assessment and Plan / UC Course  I have reviewed the triage vital signs and the nursing notes.  Pertinent labs & imaging results that were available during my care of the patient were reviewed by me and considered in my medical decision making (see chart for details).    Hives, allergic reaction to hornet sting.  Afebrile and vital signs are stable.  Benadryl , Pepcid , dexamethasone  injection given here.  Instructed patient to start Zyrtec daily this afternoon and to take it for 14 days.  Instructed her to start the prednisone  taper tomorrow.  Also prescribed EpiPen .  Instructions for use of an autoinjector and EpiPen  autoinjector provided.  Instructed patient to follow-up with her PCP tomorrow.  911 and ED precautions discussed.  Education provided on hives and hornet stings.  Patient agrees to plan of care.  (She is accompanied by a friend/coworker who will drive her home from here.)  Final Clinical Impressions(s) / UC Diagnoses   Final diagnoses:  Hives  Allergic reaction to bee sting     Discharge Instructions      Follow up with your primary care provider tomorrow.  Call 911 and go to the emergency department if you have worsening symptoms.    You were given an injection of a steroid called dexamethasone .  Start the prednisone  taper tomorrow as directed.    Take Zyrtec as directed.    Use the EpiPen  if needed as directed.      ED Prescriptions     Medication Sig Dispense Auth. Provider   predniSONE  (STERAPRED UNI-PAK 21 TAB) 10 MG (21) TBPK tablet Take by mouth daily. As directed 21 tablet Corlis Burnard DEL, NP   EPINEPHrine  0.3 mg/0.3 mL IJ SOAJ injection Inject 0.3 mg into the muscle as needed for anaphylaxis. 1 each Corlis Burnard DEL, NP      PDMP not reviewed this encounter.   Corlis Burnard DEL, NP 04/18/24 [phone]

## 2024-04-18 NOTE — Telephone Encounter (Signed)
 Appointment made with [facility] for next week, patient is being treated today for bee stings.

## 2024-04-19 ENCOUNTER — Telehealth: Payer: Self-pay

## 2024-04-19 NOTE — Telephone Encounter (Signed)
 Copied from CRM [id number]. Topic: Clinical - Medical Advice >> Apr 19, 2024  9:52 AM Sophia H wrote: Reason for CRM: Patient is calling in requesting to speak with a nurse, states had an allergic reaction yesterday and went to urgent care as advised, did also schedule a follow up appointment. Patient states she still has a rash and is just wondering if that is normal, no pain.  At urgent care was given steroid shot, predniSONE  & benadryl , told to take an allergy pill 2x a day for next few days.    Please reach out # [phone]

## 2024-04-24 ENCOUNTER — Ambulatory Visit: Admitting: Internal Medicine

## 2024-04-27 ENCOUNTER — Ambulatory Visit: Admitting: Internal Medicine

## 2024-05-02 ENCOUNTER — Ambulatory Visit: Admitting: Internal Medicine

## 2024-05-05 ENCOUNTER — Ambulatory Visit: Admitting: Internal Medicine

## 2024-05-05 ENCOUNTER — Encounter: Payer: Self-pay | Admitting: Internal Medicine

## 2024-05-05 NOTE — Progress Notes (Deleted)
 Subjective:    Patient ID: Stacey Huynh, female    DOB: 03-27-1986, 38 y.o.   MRN: [id number]  HPI    Review of Systems     Past Medical History:  Diagnosis Date   Abnormal Pap smear    Abnormal Pap smear of cervix    Anxiety    Hypertension     Current Outpatient Medications  Medication Sig Dispense Refill   acyclovir  ointment (ZOVIRAX ) 5 % Apply 1 Application topically every 3 (three) hours. 30 g 0   EPINEPHrine  0.3 mg/0.3 mL IJ SOAJ injection Inject 0.3 mg into the muscle as needed for anaphylaxis. 1 each 0   FLUoxetine  (PROZAC ) 20 MG capsule Take 1 capsule (20 mg total) by mouth daily. 90 capsule 1   ibuprofen  (ADVIL ) 200 MG tablet Take 800 mg by mouth daily as needed for mild pain.     lisinopril  (ZESTRIL ) 5 MG tablet Take 1 tablet (5 mg total) by mouth daily. 90 tablet 1   lovastatin  (MEVACOR ) 20 MG tablet Take 1 tablet (20 mg total) by mouth at bedtime. 90 tablet 1   predniSONE  (STERAPRED UNI-PAK 21 TAB) 10 MG (21) TBPK tablet Take by mouth daily. As directed 21 tablet 0   No current facility-administered medications for this visit.    Allergies  Allergen Reactions   Penicillins Other (See Comments)    Did it involve swelling of the face/tongue/throat, SOB, or low BP? Unknown Did it involve sudden or severe rash/hives, skin peeling, or any reaction on the inside of your mouth or nose? Unknown Did you need to seek medical attention at a hospital or doctor's office? Unknown When did it last happen? Childhood allergy If all above answers are NO, may proceed with cephalosporin use.     Penicillin G     Other Reaction(s): Not available    Family History  Problem Relation Age of Onset   Pancreatic cancer Paternal Aunt    Bone cancer Maternal Grandmother    Lung cancer Maternal Grandmother    Colon cancer Paternal Grandfather    Depression Mother    Depression Father    Suicidality Father     Social History   Socioeconomic History   Marital status:  Married    Spouse name: Not on file   Number of children: Not on file   Years of education: Not on file   Highest education level: Not on file  Occupational History   Not on file  Tobacco Use   Smoking status: Never   Smokeless tobacco: Never  Vaping Use   Vaping status: Never Used  Substance and Sexual Activity   Alcohol use: Not Currently    Comment: rare   Drug use: No   Sexual activity: Yes    Birth control/protection: Surgical    Comment: Hysterectomy  Other Topics Concern   Not on file  Social History Narrative   Not on file   Social Drivers of Health   Financial Resource Strain: Not on file  Food Insecurity: No Food Insecurity (01/26/2023)   Hunger Vital Sign    Worried About Running Out of Food in the Last Year: Never true    Ran Out of Food in the Last Year: Never true  Transportation Needs: No Transportation Needs (01/26/2023)   PRAPARE - Administrator, Civil Service (Medical): No    Lack of Transportation (Non-Medical): No  Physical Activity: Not on file  Stress: Not on file  Social Connections: Not on  file  Intimate Partner Violence: Not At Risk (01/26/2023)   Humiliation, Afraid, Rape, and Kick questionnaire    Fear of Current or Ex-Partner: No    Emotionally Abused: No    Physically Abused: No    Sexually Abused: No     Constitutional: Denies fever, malaise, fatigue, headache or abrupt weight changes.  HEENT: Denies eye pain, eye redness, ear pain, ringing in the ears, wax buildup, runny nose, nasal congestion, bloody nose, or sore throat. Respiratory: Denies difficulty breathing, shortness of breath, cough or sputum production.   Cardiovascular: Denies chest pain, chest tightness, palpitations or swelling in the hands or feet.  Gastrointestinal: Denies abdominal pain, bloating, constipation, diarrhea or blood in the stool.  GU: Denies urgency, frequency, pain with urination, burning sensation, blood in urine, odor or  discharge. Musculoskeletal: Denies decrease in range of motion, difficulty with gait, muscle pain or joint pain and swelling.  Skin: Denies redness, rashes, lesions or ulcercations.  Neurological: Denies dizziness, difficulty with memory, difficulty with speech or problems with balance and coordination.  Psych: Pt has a history of anxiety. Denies depression, SI/HI.  No other specific complaints in a complete review of systems (except as listed in HPI above).  Objective:   Physical Exam  LMP 11/09/2019    Wt Readings from Last 3 Encounters:  03/09/24 219 lb 2 oz (99.4 kg)  01/21/24 220 lb 6.4 oz (100 kg)  11/18/23 220 lb (99.8 kg)    General: Appears her stated age, obese, in NAD. Skin: Warm, dry and intact.   HEENT: Head: normal shape and size; Eyes: sclera white, no icterus, conjunctiva pink, PERRLA and EOMs intact;  Neck:  Neck supple, trachea midline. No masses, lumps or thyromegaly present.  Cardiovascular: Normal rate and rhythm. S1,S2 noted.  No murmur, rubs or gallops noted. No JVD or BLE edema.  Pulmonary/Chest: Normal effort and positive vesicular breath sounds. No respiratory distress. No wheezes, rales or ronchi noted.  Abdomen: Normal bowel sound Musculoskeletal: Strength 5/5 BUE/BLE.  No difficulty with gait.  Neurological: Alert and oriented. Cranial nerves II-XII grossly intact. Coordination normal.  Psychiatric: Mood and affect normal. Behavior is normal. Judgment and thought content normal.   BMET    Component Value Date/Time   NA 138 01/21/2024 1515   NA 139 05/12/2017 0000   K 4.2 01/21/2024 1515   CL 104 01/21/2024 1515   CO2 28 01/21/2024 1515   GLUCOSE 90 01/21/2024 1515   BUN 9 01/21/2024 1515   BUN 9 05/12/2017 0000   CREATININE 0.71 01/21/2024 1515   CALCIUM 8.9 01/21/2024 1515   GFRNONAA >60 01/25/2023 1829   GFRNONAA 82 06/06/2020 1618   GFRAA 95 06/06/2020 1618    Lipid Panel     Component Value Date/Time   CHOL 174 01/21/2024 1515    CHOL 193 05/12/2017 0000   TRIG 155 (H) 01/21/2024 1515   HDL 47 (L) 01/21/2024 1515   HDL 40 05/12/2017 0000   CHOLHDL 3.7 01/21/2024 1515   LDLCALC 102 (H) 01/21/2024 1515    CBC    Component Value Date/Time   WBC 7.9 01/21/2024 1515   RBC 4.46 01/21/2024 1515   HGB 13.6 01/21/2024 1515   HGB 14.4 05/12/2017 0000   HCT 40.5 01/21/2024 1515   HCT 41.5 05/12/2017 0000   PLT 343 01/21/2024 1515   PLT 273 05/12/2017 0000   MCV 90.8 01/21/2024 1515   MCV 89 05/12/2017 0000   MCH 30.5 01/21/2024 1515   MCHC 33.6 01/21/2024  1515   RDW 12.1 01/21/2024 1515   RDW 13.3 05/12/2017 0000   LYMPHSABS 1,883 06/06/2020 1618   MONOABS 0.6 04/17/2019 1925   EOSABS 172 06/06/2020 1618   BASOSABS 52 06/06/2020 1618    Hgb A1C Lab Results  Component Value Date   HGBA1C 5.4 01/21/2024           Assessment & Plan:    RTC in 3 months, follow-up chronic conditions Angeline Laura, NP

## 2024-06-23 DIAGNOSIS — Z79899 Other long term (current) drug therapy: Secondary | ICD-10-CM | POA: Diagnosis not present

## 2024-06-23 DIAGNOSIS — R41840 Attention and concentration deficit: Secondary | ICD-10-CM | POA: Diagnosis not present

## 2024-06-30 DIAGNOSIS — Z79899 Other long term (current) drug therapy: Secondary | ICD-10-CM | POA: Diagnosis not present

## 2024-06-30 DIAGNOSIS — R41840 Attention and concentration deficit: Secondary | ICD-10-CM | POA: Diagnosis not present

## 2024-07-03 DIAGNOSIS — Z79899 Other long term (current) drug therapy: Secondary | ICD-10-CM | POA: Diagnosis not present

## 2024-07-03 DIAGNOSIS — R41840 Attention and concentration deficit: Secondary | ICD-10-CM | POA: Diagnosis not present

## 2024-07-21 ENCOUNTER — Ambulatory Visit: Admitting: Internal Medicine

## 2024-07-25 ENCOUNTER — Ambulatory Visit: Admitting: Internal Medicine

## 2024-07-25 ENCOUNTER — Encounter: Payer: Self-pay | Admitting: Internal Medicine

## 2024-07-25 VITALS — BP 122/78 | Ht 66.0 in | Wt 207.4 lb

## 2024-07-25 DIAGNOSIS — I10 Essential (primary) hypertension: Secondary | ICD-10-CM

## 2024-07-25 DIAGNOSIS — E66811 Obesity, class 1: Secondary | ICD-10-CM | POA: Diagnosis not present

## 2024-07-25 DIAGNOSIS — F909 Attention-deficit hyperactivity disorder, unspecified type: Secondary | ICD-10-CM | POA: Insufficient documentation

## 2024-07-25 DIAGNOSIS — Z6833 Body mass index (BMI) 33.0-33.9, adult: Secondary | ICD-10-CM

## 2024-07-25 DIAGNOSIS — F419 Anxiety disorder, unspecified: Secondary | ICD-10-CM

## 2024-07-25 DIAGNOSIS — E782 Mixed hyperlipidemia: Secondary | ICD-10-CM | POA: Diagnosis not present

## 2024-07-25 DIAGNOSIS — T7840XA Allergy, unspecified, initial encounter: Secondary | ICD-10-CM

## 2024-07-25 DIAGNOSIS — E6609 Other obesity due to excess calories: Secondary | ICD-10-CM

## 2024-07-25 DIAGNOSIS — F902 Attention-deficit hyperactivity disorder, combined type: Secondary | ICD-10-CM

## 2024-07-25 MED ORDER — LISINOPRIL 5 MG PO TABS: 5.0000 mg | ORAL_TABLET | Freq: Every day | ORAL | 1 refills | Status: AC

## 2024-07-25 MED ORDER — FLUOXETINE HCL 20 MG PO CAPS: 20.0000 mg | ORAL_CAPSULE | Freq: Every day | ORAL | 1 refills | Status: AC

## 2024-07-25 MED ORDER — LOVASTATIN 20 MG PO TABS: 20.0000 mg | ORAL_TABLET | Freq: Every day | ORAL | 1 refills | Status: AC

## 2024-07-25 NOTE — Patient Instructions (Signed)
 Hypertension, Adult Hypertension is another name for high blood pressure. High blood pressure forces your heart to work harder to pump blood. This can cause problems over time. There are two numbers in a blood pressure reading. There is a top number (systolic) over a bottom number (diastolic). It is best to have a blood pressure that is below 120/80. What are the causes? The cause of this condition is not known. Some other conditions can lead to high blood pressure. What increases the risk? Some lifestyle factors can make you more likely to develop high blood pressure: Smoking. Not getting enough exercise or physical activity. Being overweight. Having too much fat, sugar, calories, or salt (sodium) in your diet. Drinking too much alcohol. Other risk factors include: Having any of these conditions: Heart disease. Diabetes. High cholesterol. Kidney disease. Obstructive sleep apnea. Having a family history of high blood pressure and high cholesterol. Age. The risk increases with age. Stress. What are the signs or symptoms? High blood pressure may not cause symptoms. Very high blood pressure (hypertensive crisis) may cause: Headache. Fast or uneven heartbeats (palpitations). Shortness of breath. Nosebleed. Vomiting or feeling like you may vomit (nauseous). Changes in how you see. Very bad chest pain. Feeling dizzy. Seizures. How is this treated? This condition is treated by making healthy lifestyle changes, such as: Eating healthy foods. Exercising more. Drinking less alcohol. Your doctor may prescribe medicine if lifestyle changes do not help enough and if: Your top number is above 130. Your bottom number is above 80. Your personal target blood pressure may vary. Follow these instructions at home: Eating and drinking  If told, follow the DASH eating plan. To follow this plan: Fill one half of your plate at each meal with fruits and vegetables. Fill one fourth of your plate  at each meal with whole grains. Whole grains include whole-wheat pasta, brown rice, and whole-grain bread. Eat or drink low-fat dairy products, such as skim milk or low-fat yogurt. Fill one fourth of your plate at each meal with low-fat (lean) proteins. Low-fat proteins include fish, chicken without skin, eggs, beans, and tofu. Avoid fatty meat, cured and processed meat, or chicken with skin. Avoid pre-made or processed food. Limit the amount of salt in your diet to less than 1,500 mg each day. Do not drink alcohol if: Your doctor tells you not to drink. You are pregnant, may be pregnant, or are planning to become pregnant. If you drink alcohol: Limit how much you have to: 0-1 drink a day for women. 0-2 drinks a day for men. Know how much alcohol is in your drink. In the [place] one drink equals one 12 oz bottle of beer (355 mL), one 5 oz glass of wine (148 mL), or one 1 oz glass of hard liquor (44 mL). Lifestyle  Work with your doctor to stay at a healthy weight or to lose weight. Ask your doctor what the best weight is for you. Get at least 30 minutes of exercise that causes your heart to beat faster (aerobic exercise) most days of the week. This may include walking, swimming, or biking. Get at least 30 minutes of exercise that strengthens your muscles (resistance exercise) at least 3 days a week. This may include lifting weights or doing Pilates. Do not smoke or use any products that contain nicotine or tobacco. If you need help quitting, ask your doctor. Check your blood pressure at home as told by your doctor. Keep all follow-up visits. Medicines Take over-the-counter and prescription medicines  only as told by your doctor. Follow directions carefully. Do not skip doses of blood pressure medicine. The medicine does not work as well if you skip doses. Skipping doses also puts you at risk for problems. Ask your doctor about side effects or reactions to medicines that you should watch  for. Contact a doctor if: You think you are having a reaction to the medicine you are taking. You have headaches that keep coming back. You feel dizzy. You have swelling in your ankles. You have trouble with your vision. Get help right away if: You get a very bad headache. You start to feel mixed up (confused). You feel weak or numb. You feel faint. You have very bad pain in your: Chest. Belly (abdomen). You vomit more than once. You have trouble breathing. These symptoms may be an emergency. Get help right away. Call 911. Do not wait to see if the symptoms will go away. Do not drive yourself to the hospital. Summary Hypertension is another name for high blood pressure. High blood pressure forces your heart to work harder to pump blood. For most people, a normal blood pressure is less than 120/80. Making healthy choices can help lower blood pressure. If your blood pressure does not get lower with healthy choices, you may need to take medicine. This information is not intended to replace advice given to you by your health care provider. Make sure you discuss any questions you have with your health care provider. Document Revised: 07/17/2021 Document Reviewed: 07/17/2021 Elsevier Patient Education  2024 [employer].

## 2024-07-25 NOTE — Assessment & Plan Note (Addendum)
 Complicated by morbid obesity Will check lipid profile at annual exam Encouraged her to consume low-fat diet Continue lovastatin  10 mg daily

## 2024-07-25 NOTE — Assessment & Plan Note (Signed)
 She will continue Mounjaro 5 mg weekly (this is prescribed by employee health).

## 2024-07-25 NOTE — Assessment & Plan Note (Addendum)
 Complicated by morbid obesity Controlled on lisinopril  5 mg daily Reinforced DASH diet and exercise for weight loss Kidney function reviewed

## 2024-07-25 NOTE — Progress Notes (Signed)
 Subjective:    Patient ID: Stacey Huynh, female    DOB: [birth year], 38 y.o.   MRN: [id number]  HPI  Patient presents to clinic today for 60-month follow-up of chronic conditions.  Anxiety: Chronic, managed on fluoxetine .  She is not currently seeing a therapist.  She denies depression, SI/HI.  HLD: Her last LDL was 102, triglycerides 155, 01/2024.  She denies myalgias on lovastatin .  She tries to consume low-fat diet.  HTN: Her BP today is 122/78.  She is taking lisinopril  as prescribed.  ECG from 01/2020 reviewed.  ADHD: Diagnosed last month at [facility]. She reports mainly inattention. She is taking methylphendiate as prescribed. She follows with psychiatry.  She would also like referral for allergy testing.  She reports she had a bee sting back in the summer and subsequently developed hives and a rash that persisted for some time.  She is not allergic to bees that she was aware of.  She has had allergy testing in the past.  Review of Systems     Past Medical History:  Diagnosis Date   Abnormal Pap smear    Abnormal Pap smear of cervix    Anxiety    Hypertension     Current Outpatient Medications  Medication Sig Dispense Refill   acyclovir  ointment (ZOVIRAX ) 5 % Apply 1 Application topically every 3 (three) hours. 30 g 0   EPINEPHrine  0.3 mg/0.3 mL IJ SOAJ injection Inject 0.3 mg into the muscle as needed for anaphylaxis. 1 each 0   FLUoxetine  (PROZAC ) 20 MG capsule Take 1 capsule (20 mg total) by mouth daily. 90 capsule 1   ibuprofen  (ADVIL ) 200 MG tablet Take 800 mg by mouth daily as needed for mild pain.     lisinopril  (ZESTRIL ) 5 MG tablet Take 1 tablet (5 mg total) by mouth daily. 90 tablet 1   lovastatin  (MEVACOR ) 20 MG tablet Take 1 tablet (20 mg total) by mouth at bedtime. 90 tablet 1   predniSONE  (STERAPRED UNI-PAK 21 TAB) 10 MG (21) TBPK tablet Take by mouth daily. As directed 21 tablet 0   No current facility-administered medications for  this visit.    Allergies  Allergen Reactions   Penicillins Other (See Comments)    Did it involve swelling of the face/tongue/throat, SOB, or low BP? Unknown Did it involve sudden or severe rash/hives, skin peeling, or any reaction on the inside of your mouth or nose? Unknown Did you need to seek medical attention at a hospital or doctor's office? Unknown When did it last happen? Childhood allergy If all above answers are NO, may proceed with cephalosporin use.     Penicillin G     Other Reaction(s): Not available    Family History  Problem Relation Age of Onset   Pancreatic cancer Paternal Aunt    Bone cancer Maternal Grandmother    Lung cancer Maternal Grandmother    Colon cancer Paternal Grandfather    Depression Mother    Depression Father    Suicidality Father     Social History   Socioeconomic History   Marital status: Married    Spouse name: Not on file   Number of children: Not on file   Years of education: Not on file   Highest education level: Associate degree: academic program  Occupational History   Not on file  Tobacco Use   Smoking status: Never   Smokeless tobacco: Never  Vaping Use   Vaping status: Never Used  Substance and Sexual  Activity   Alcohol use: Not Currently    Comment: rare   Drug use: No   Sexual activity: Yes    Birth control/protection: Surgical    Comment: Hysterectomy  Other Topics Concern   Not on file  Social History Narrative   Not on file   Social Drivers of Health   Financial Resource Strain: High Risk (07/24/2024)   Overall Financial Resource Strain (CARDIA)    Difficulty of Paying Living Expenses: Hard  Food Insecurity: Food Insecurity Present (07/24/2024)   Hunger Vital Sign    Worried About Running Out of Food in the Last Year: Sometimes true    Ran Out of Food in the Last Year: Sometimes true  Transportation Needs: No Transportation Needs (07/24/2024)   PRAPARE - Administrator, Civil Service  (Medical): No    Lack of Transportation (Non-Medical): No  Physical Activity: Insufficiently Active (07/24/2024)   Exercise Vital Sign    Days of Exercise per Week: 2 days    Minutes of Exercise per Session: 30 min  Stress: Stress Concern Present (07/24/2024)   [employer] of Occupational Health - Occupational Stress Questionnaire    Feeling of Stress: To some extent  Social Connections: Moderately Isolated (07/24/2024)   Social Connection and Isolation Panel    Frequency of Communication with Friends and Family: More than three times a week    Frequency of Social Gatherings with Friends and Family: Once a week    Attends Religious Services: Never    Database administrator or Organizations: No    Attends Engineer, structural: Not on file    Marital Status: Married  Catering manager Violence: Not At Risk (01/26/2023)   Humiliation, Afraid, Rape, and Kick questionnaire    Fear of Current or Ex-Partner: No    Emotionally Abused: No    Physically Abused: No    Sexually Abused: No     Constitutional: Denies fever, malaise, fatigue, headache or abrupt weight changes.  HEENT: Denies eye pain, eye redness, ear pain, ringing in the ears, wax buildup, runny nose, nasal congestion, bloody nose, or sore throat. Respiratory: Denies difficulty breathing, shortness of breath, cough or sputum production.   Cardiovascular: Denies chest pain, chest tightness, palpitations or swelling in the hands or feet.  Gastrointestinal: Denies abdominal pain, bloating, constipation, diarrhea or blood in the stool.  GU: Denies urgency, frequency, pain with urination, burning sensation, blood in urine, odor or discharge. Musculoskeletal: Denies decrease in range of motion, difficulty with gait, muscle pain or joint pain and swelling.  Skin: Denies redness, rashes, lesions or ulcercations.  Neurological: Patient reports inattention.  Denies dizziness, difficulty with memory, difficulty with speech or  problems with balance and coordination.  Psych: Patient has a history of anxiety.  Denies depression, SI/HI.  No other specific complaints in a complete review of systems (except as listed in HPI above).  Objective:   Physical Exam  LMP 11/09/2019   Wt Readings from Last 3 Encounters:  03/09/24 219 lb 2 oz (99.4 kg)  01/21/24 220 lb 6.4 oz (100 kg)  11/18/23 220 lb (99.8 kg)    General: Appears her stated age, obese, in NAD. Skin: Warm, dry and intact.  HEENT: Head: normal shape and size; Eyes: sclera white, no icterus, conjunctiva pink, PERRLA and EOMs intact;  Cardiovascular: Normal rate and rhythm. S1,S2 noted.  No murmur, rubs or gallops noted.  Pulmonary/Chest: Normal effort and positive vesicular breath sounds. No respiratory distress. No wheezes, rales or  ronchi noted.  Musculoskeletal: . No difficulty with gait.  Neurological: Alert and oriented. Coordination normal.  Psychiatric: Mood and affect normal. Behavior is normal. Judgment and thought content normal.    BMET    Component Value Date/Time   NA 138 01/21/2024 1515   NA 139 05/12/2017 0000   K 4.2 01/21/2024 1515   CL 104 01/21/2024 1515   CO2 28 01/21/2024 1515   GLUCOSE 90 01/21/2024 1515   BUN 9 01/21/2024 1515   BUN 9 05/12/2017 0000   CREATININE 0.71 01/21/2024 1515   CALCIUM 8.9 01/21/2024 1515   GFRNONAA >60 01/25/2023 1829   GFRNONAA 82 06/06/2020 1618   GFRAA 95 06/06/2020 1618    Lipid Panel     Component Value Date/Time   CHOL 174 01/21/2024 1515   CHOL 193 05/12/2017 0000   TRIG 155 (H) 01/21/2024 1515   HDL 47 (L) 01/21/2024 1515   HDL 40 05/12/2017 0000   CHOLHDL 3.7 01/21/2024 1515   LDLCALC 102 (H) 01/21/2024 1515    CBC    Component Value Date/Time   WBC 7.9 01/21/2024 1515   RBC 4.46 01/21/2024 1515   HGB 13.6 01/21/2024 1515   HGB 14.4 05/12/2017 0000   HCT 40.5 01/21/2024 1515   HCT 41.5 05/12/2017 0000   PLT 343 01/21/2024 1515   PLT 273 05/12/2017 0000   MCV 90.8  01/21/2024 1515   MCV 89 05/12/2017 0000   MCH 30.5 01/21/2024 1515   MCHC 33.6 01/21/2024 1515   RDW 12.1 01/21/2024 1515   RDW 13.3 05/12/2017 0000   LYMPHSABS 1,883 06/06/2020 1618   MONOABS 0.6 04/17/2019 1925   EOSABS 172 06/06/2020 1618   BASOSABS 52 06/06/2020 1618    Hgb A1C Lab Results  Component Value Date   HGBA1C 5.4 01/21/2024        Assessment and Plan:   Allergic reaction:  Referral to allergy placed per her request Advised her to keep Benadryl  on her and take up to 50 mg every 8 hours as needed She has an EpiPen  that was prescribed by urgent care     RTC in 6 months for your annual exam Angeline Laura, NP

## 2024-07-25 NOTE — Assessment & Plan Note (Signed)
 She will continue methylphenidate 30 mg daily as prescribed by psychiatry

## 2024-07-25 NOTE — Assessment & Plan Note (Signed)
 Controlled on fluoxetine  20 mg daily Support offered

## 2024-07-28 DIAGNOSIS — F902 Attention-deficit hyperactivity disorder, combined type: Secondary | ICD-10-CM | POA: Diagnosis not present

## 2024-08-22 ENCOUNTER — Encounter: Payer: Self-pay | Admitting: Internal Medicine

## 2024-11-03 ENCOUNTER — Ambulatory Visit: Admitting: Internal Medicine

## 2025-01-24 ENCOUNTER — Encounter: Admitting: Internal Medicine
